# Patient Record
Sex: Male | Born: 1935
Health system: Southern US, Community
[De-identification: ages and names within clinical notes are randomized; demographics above are authoritative.]

## PROBLEM LIST (undated history)

## (undated) DIAGNOSIS — Z8781 Personal history of (healed) traumatic fracture: Secondary | ICD-10-CM

## (undated) DIAGNOSIS — I10 Essential (primary) hypertension: Secondary | ICD-10-CM

## (undated) DIAGNOSIS — I4891 Unspecified atrial fibrillation: Secondary | ICD-10-CM

## (undated) DIAGNOSIS — I499 Cardiac arrhythmia, unspecified: Secondary | ICD-10-CM

## (undated) DIAGNOSIS — K219 Gastro-esophageal reflux disease without esophagitis: Secondary | ICD-10-CM

## (undated) DIAGNOSIS — K2210 Ulcer of esophagus without bleeding: Secondary | ICD-10-CM

## (undated) DIAGNOSIS — M069 Rheumatoid arthritis, unspecified: Secondary | ICD-10-CM

## (undated) HISTORY — PX: CATARACT EXTRACTION: SUR2

---

## 2000-09-06 ENCOUNTER — Ambulatory Visit (HOSPITAL_COMMUNITY): Admission: RE | Admit: 2000-09-06 | Discharge: 2000-09-06 | Payer: Self-pay | Admitting: *Deleted

## 2000-09-06 ENCOUNTER — Encounter (INDEPENDENT_AMBULATORY_CARE_PROVIDER_SITE_OTHER): Payer: Self-pay | Admitting: Specialist

## 2002-08-05 ENCOUNTER — Emergency Department (HOSPITAL_COMMUNITY): Admission: EM | Admit: 2002-08-05 | Discharge: 2002-08-05 | Payer: Self-pay | Admitting: Emergency Medicine

## 2002-08-05 ENCOUNTER — Encounter: Payer: Self-pay | Admitting: Emergency Medicine

## 2003-12-06 ENCOUNTER — Ambulatory Visit (HOSPITAL_COMMUNITY): Admission: RE | Admit: 2003-12-06 | Discharge: 2003-12-06 | Payer: Self-pay | Admitting: *Deleted

## 2005-06-25 ENCOUNTER — Ambulatory Visit (HOSPITAL_COMMUNITY): Admission: RE | Admit: 2005-06-25 | Discharge: 2005-06-25 | Payer: Self-pay | Admitting: Cardiology

## 2005-06-25 IMAGING — CR DG CHEST 2V
3 series · 3 of 3 positions shown · non-contrast
Comparison: There are no prior studies available for comparison t this time.

CLINICAL DATA: Irregular heartbeat.  Pre cardioversion.
 CHEST ? 2 VIEW:

[view not recorded (1 of 3)]
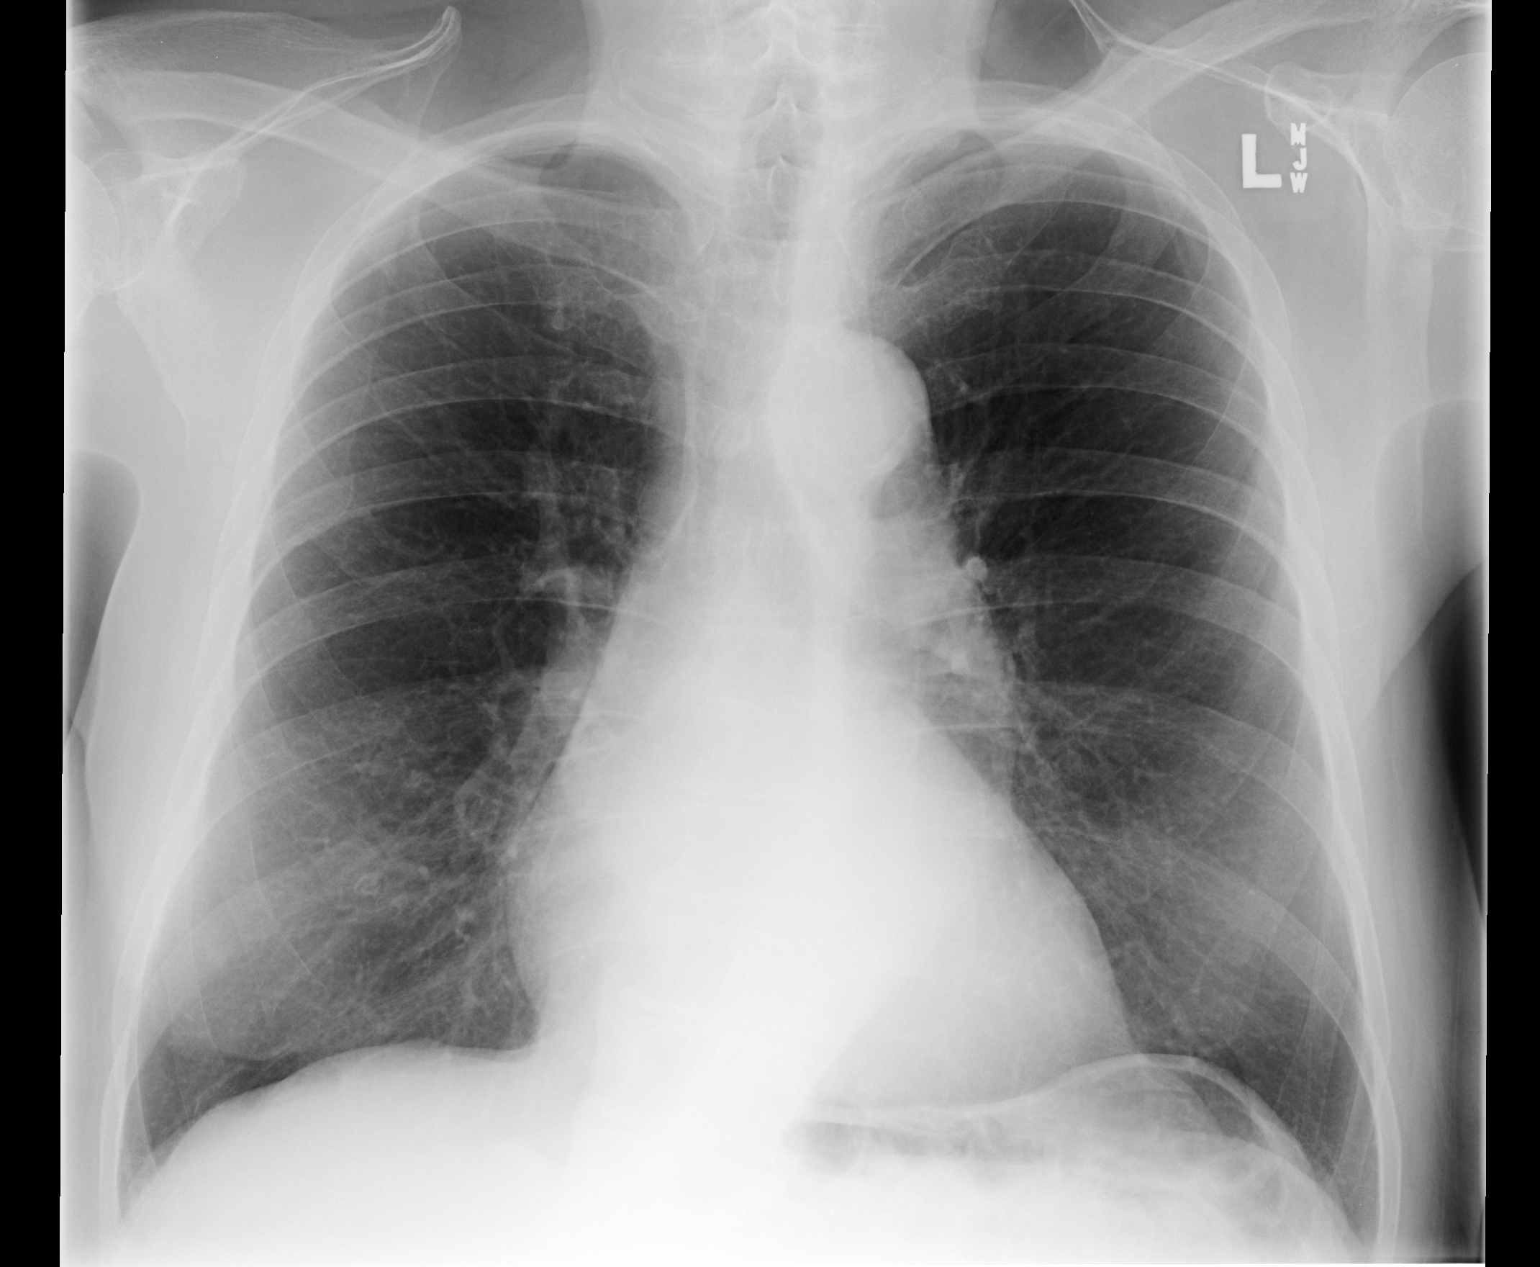

[view not recorded (2 of 3)]
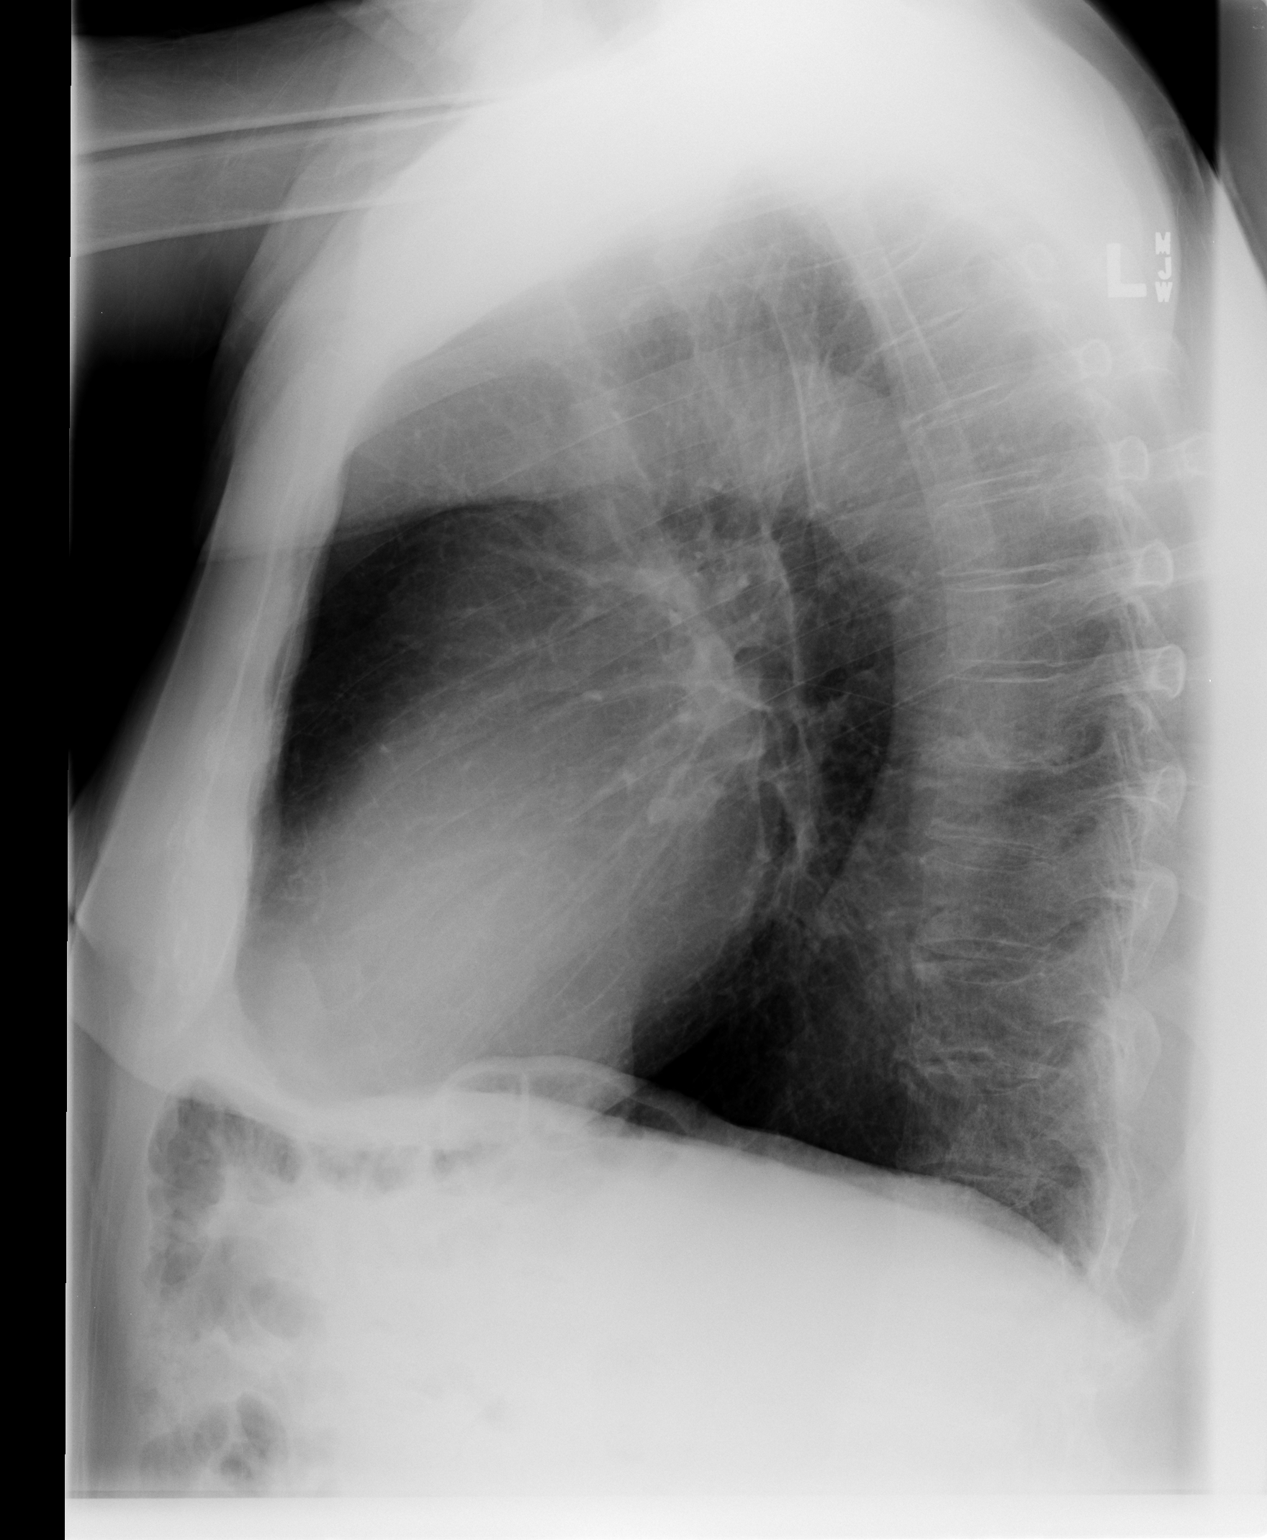

[view not recorded (3 of 3)]
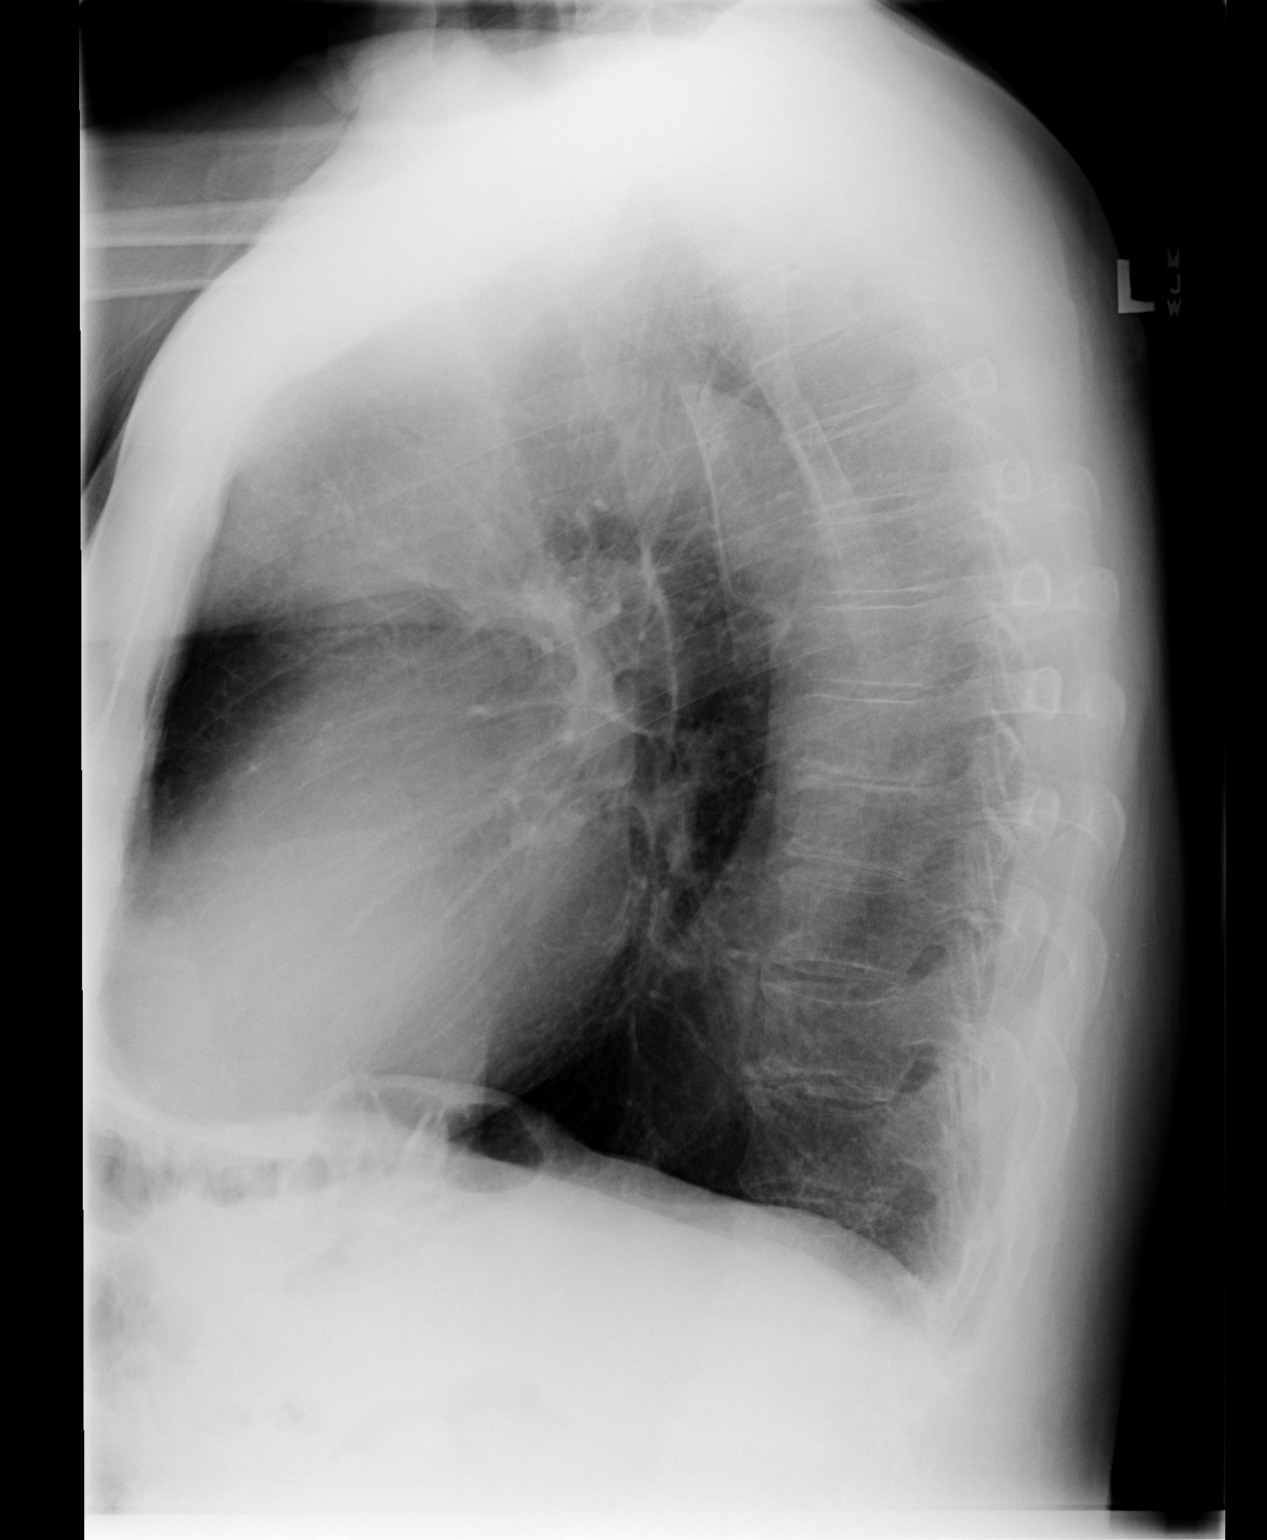

[3 of 3 positions shown; findings below may reference images not displayed]

There is cardiomegaly.   There are no infiltrative or edematous changes.   The aorta is elongated and ectatic.   There are mild changes of COPD.
IMPRESSION: Cardiomegaly.   Ectatic thoracic aorta.   Mild changes of COPD.   No evidence for active chest disease.

## 2008-02-08 ENCOUNTER — Emergency Department (HOSPITAL_COMMUNITY): Admission: EM | Admit: 2008-02-08 | Discharge: 2008-02-08 | Payer: Self-pay | Admitting: Emergency Medicine

## 2008-02-08 IMAGING — CR DG ABDOMEN ACUTE W/ 1V CHEST
3 series · 3 of 3 positions shown · non-contrast
Comparison: [DATE] [DATE]

CLINICAL DATA: Abdominal pain

ACUTE ABDOMEN SERIES (ABDOMEN 2 VIEW & CHEST 1 VIEW)

[w chest pa]
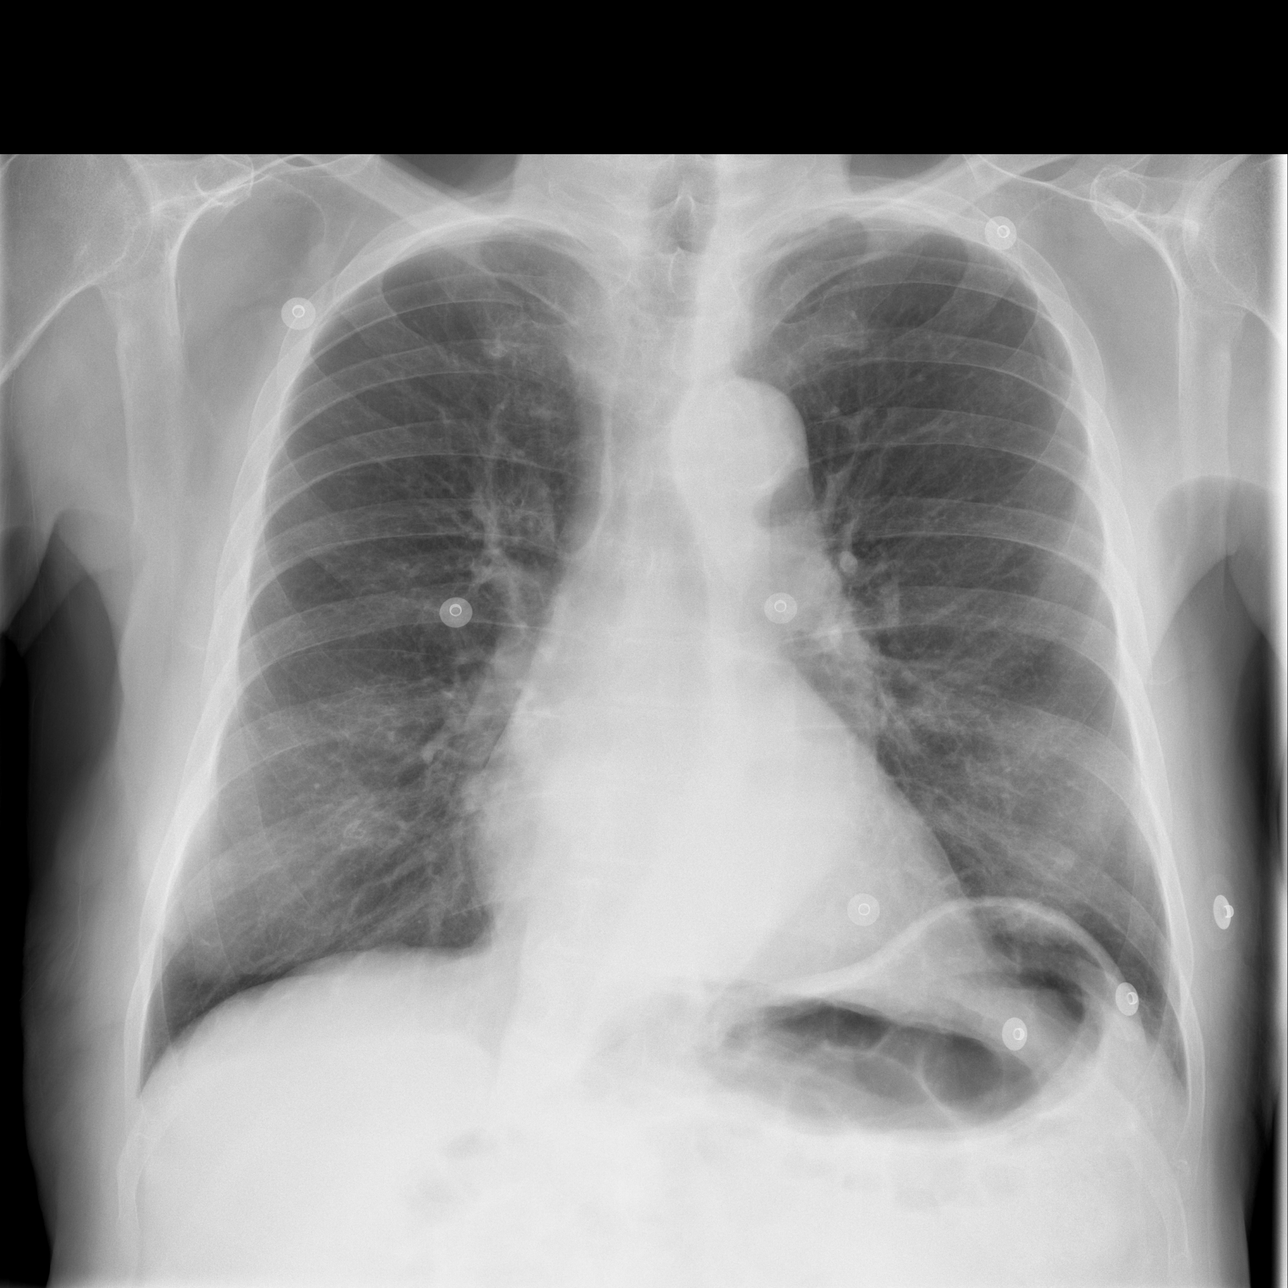

[w abdomen upright]
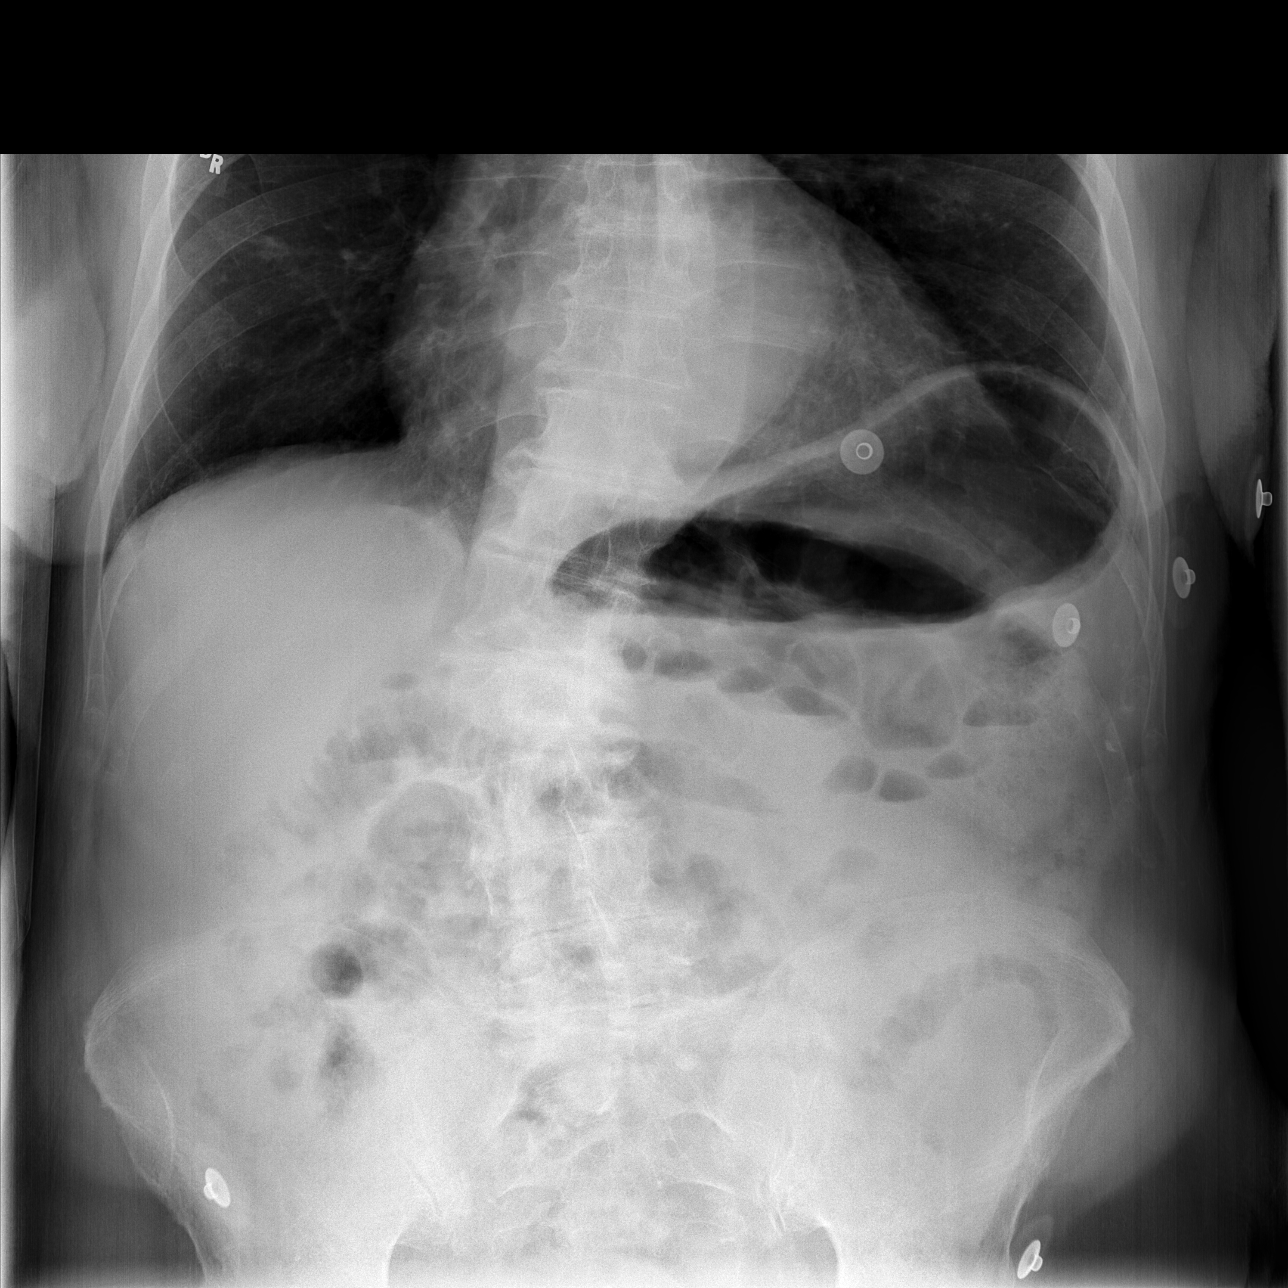

[t abdomen supine]
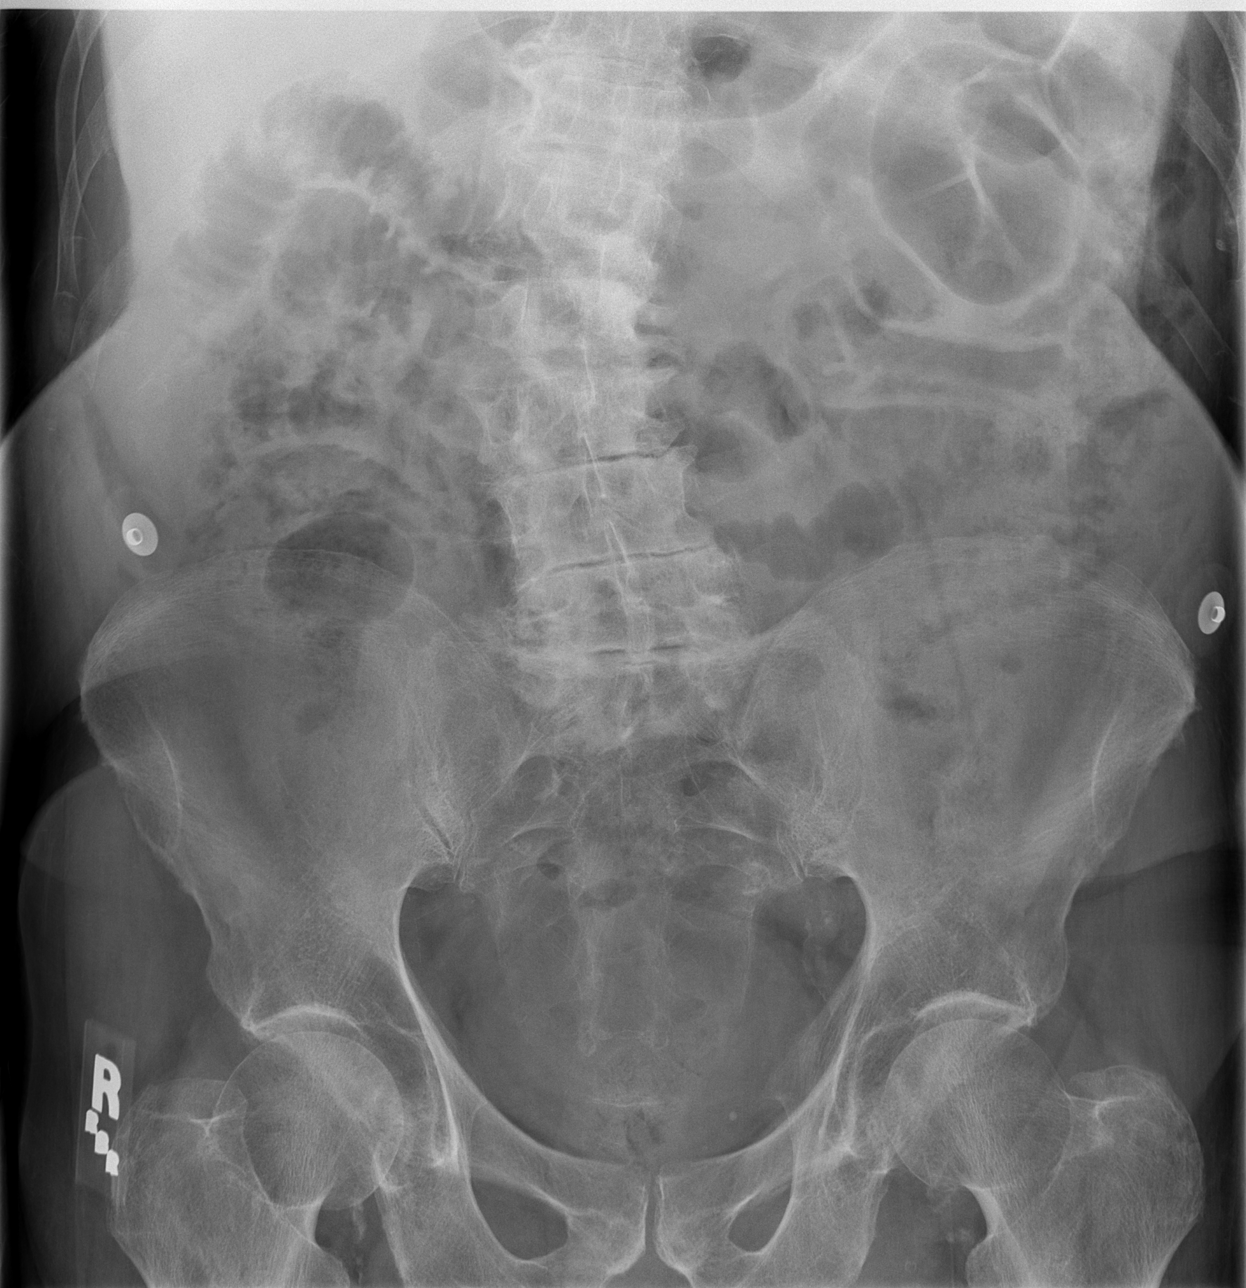

[3 of 3 positions shown; findings below may reference images not displayed]

FINDINGS: Bibasilar nodular densities are stable compatible with
nipple shadows.  The heart is borderline enlarged.

There is no free intraperitoneal gas.  Gas filled loops of small
and large bowel are present without disproportionate dilatation.
Scattered air-fluid levels are present.  No pneumatosis.  Marked
degenerative change in the lumbar spine is present.  Phleboliths
and vascular calcifications are present.
IMPRESSION: Mild ileus pattern.

## 2011-03-30 NOTE — Procedures (Signed)
[facility]  Patient:    [name], [name]                        MRN: [id number] Proc. Date: 09/06/00 Adm. Date:  [id number] Attending:  Sabino Gasser                           Procedure Report  PROCEDURE:  Upper endoscopy.  ENDOSCOPIST:  Sabino Gasser, M.D.  ANESTHESIA:  Demerol 60 mg, Versed 6 mg.  DESCRIPTION OF PROCEDURE:  With the patient mildly sedated in the left lateral decubitus position, the Olympus videoscopic endoscope was inserted in the mouth and passed under direct vision through the esophagus which appeared generally normal although there were some changes of possible esophagitis. They were difficult to photograph but we did reach the distal esophagus for biopsies.  We entered into the stomach and interestingly there were flecks of blood scattered throughout the stomach.  Fundus, body and antrum all with flecks of blood seen.  A CLO biopsy was taken.  We entered into the duodenal bulb and on the anterior wall just distal to the pylorus was a plaque-like lesion which he photographed and biopsied.  We advanced into the second portion of the duodenum which appeared normal and was photographed.  From this point, the endoscope was slowly withdrawn taking circumferential views of the entire duodenal mucosa.  The endoscope was then pulled back into the stomach and placed in retroflexion to view the stomach from below, and a hiatal hernia was seen as evidenced by incomplete wrap of the GE junction around the endoscope.  The endoscope was straightened and withdrawn taking circumferential views of the remaining gastric and esophageal mucosa.  The patients vital signs and pulse oximetry remained stable.  The patient tolerated the procedure well without apparent complications.  FINDINGS:  Plaque-like lesion of the duodenum.   Await biopsy report.  CLO biopsy taken of the antrum.  Esophagitis biopsy and a hiatal hernia was noted.  PLAN:  The patient will  call me for results of biopsy and follow up with me as an outpatient. DD:  09/06/00 TD:  09/06/00 Job: [id number]

## 2011-03-30 NOTE — Op Note (Signed)
[name], [name]                           ACCOUNT NO.:  [id number]   MEDICAL RECORD NO.:  [id number]                   PATIENT TYPE:  AMB   LOCATION:  ENDO                                 FACILITY:  [facility]   PHYSICIAN:  Georgiana Spinner, M.D.                 DATE OF BIRTH:  09/06/1923   DATE OF PROCEDURE:  DATE OF DISCHARGE:                                 OPERATIVE REPORT   PROCEDURE:  Colonoscopy.   INDICATION:  Colon polyp.   ANESTHESIA:  Demerol 80 mg, Versed 8 mg.   DESCRIPTION OF PROCEDURE:  With the patient mildly sedated in the left  lateral decubitus position, the Olympus videoscopic colonoscope was inserted  in the rectum and passed under direct vision to the cecum identified by the  ileocecal valve and appendiceal orifice both of which were photographed.  From this point, the colonoscope was slowly withdrawn, taking  circumferential views of the colonic mucosa and stopping in the rectum which  appeared normal on direct and retroflex view.  The endoscope was  straightened and withdrawn.  The patient's vital signs and pulse oximetry  remained stable, the patient tolerated the procedure well without apparent  complications.   FINDINGS:  Diverticulosis of the sigmoid colon, otherwise an unremarkable  examination.   PLAN:  Repeat examination in 5 years.                                               Georgiana Spinner, M.D.    GMO/MEDQ  D:  12/06/2003  T:  12/06/2003  Job:  [id number]

## 2011-03-30 NOTE — Procedures (Signed)
[facility]  Patient:    [name], [name]                        MRN: [id number] Proc. Date: 09/06/00 Adm. Date:  [id number] Attending:  Sabino Gasser                           Procedure Report  PROCEDURE:  Colonoscopy.  SURGEON:  Sabino Gasser, M.D.  INDICATIONS:  Hemoccult positivity.  ANESTHESIA:  Demerol 20 mg and Versed 2 mg.  DESCRIPTION OF PROCEDURE:  With the patient mildly sedated in the left lateral decubitus position, the Olympus videoscopic colonoscope was inserted in the rectum, and passed under direct vision to the cecum.  The cecum was identified by the ileocecal valve and appendiceal orifice, both of which were photographed.  There were three small polyps seen in the cecum and they were removed using hot biopsy forceps.  ________ of 20/20 blunted current.  We then entered into the terminal ileum which appeared normal and was photographed.  The endoscope was then withdrawn, taking circumferential views of the terminal ileum and the colon, stopping to photograph the cecum, and a rare diverticulum seen ________ in the sigmoid colon.  We stopped eventually in the rectum which appeared normal on direct and retroflexed view.  The endoscope was straightened and withdrawn.  The patients vital signs and pulse oximeter remained stable.  The patient tolerated the procedure well without apparent complications.  FINDINGS:  Rare diverticulum of sigmoid, three small sessile polyps of the cecum, await biopsy report.  The patient will call me for results and follow up with me as an outpatient. DD:  09/06/00 TD:  09/06/00 Job: [id number]

## 2011-05-03 ENCOUNTER — Inpatient Hospital Stay (HOSPITAL_COMMUNITY)
Admission: EM | Admit: 2011-05-03 | Discharge: 2011-05-06 | DRG: 310 | Disposition: A | Discharge status: Home / Self Care | Payer: Medicare Other | Attending: Cardiology | Admitting: Cardiology

## 2011-05-03 ENCOUNTER — Emergency Department (HOSPITAL_COMMUNITY): Payer: Medicare Other

## 2011-05-03 DIAGNOSIS — I498 Other specified cardiac arrhythmias: Principal | ICD-10-CM | POA: Diagnosis present

## 2011-05-03 DIAGNOSIS — R791 Abnormal coagulation profile: Secondary | ICD-10-CM | POA: Diagnosis present

## 2011-05-03 DIAGNOSIS — I10 Essential (primary) hypertension: Secondary | ICD-10-CM | POA: Diagnosis present

## 2011-05-03 DIAGNOSIS — Z79899 Other long term (current) drug therapy: Secondary | ICD-10-CM

## 2011-05-03 DIAGNOSIS — Z7901 Long term (current) use of anticoagulants: Secondary | ICD-10-CM

## 2011-05-03 DIAGNOSIS — I4891 Unspecified atrial fibrillation: Secondary | ICD-10-CM | POA: Diagnosis present

## 2011-05-03 DIAGNOSIS — R0789 Other chest pain: Secondary | ICD-10-CM | POA: Diagnosis present

## 2011-05-03 DIAGNOSIS — M129 Arthropathy, unspecified: Secondary | ICD-10-CM | POA: Diagnosis present

## 2011-05-03 DIAGNOSIS — I4892 Unspecified atrial flutter: Secondary | ICD-10-CM | POA: Diagnosis present

## 2011-05-03 LAB — CARDIAC PANEL(CRET KIN+CKTOT+MB+TROPI): Relative Index: INVALID (ref 0.0–2.5)

## 2011-05-03 LAB — DIFFERENTIAL
Basophils Absolute: 0 10*3/uL (ref 0.0–0.1)
Basophils Relative: 0 % (ref 0–1)
Eosinophils Absolute: 0 10*3/uL (ref 0.0–0.7)
Eosinophils Relative: 0 % (ref 0–5)
Lymphocytes Relative: 5 % — ABNORMAL LOW (ref 12–46)
Lymphs Abs: 0.6 10*3/uL — ABNORMAL LOW (ref 0.7–4.0)
Monocytes Absolute: 0.8 10*3/uL (ref 0.1–1.0)
Neutro Abs: 10.4 10*3/uL — ABNORMAL HIGH (ref 1.7–7.7)
Neutrophils Relative %: 88 % — ABNORMAL HIGH (ref 43–77)

## 2011-05-03 LAB — BASIC METABOLIC PANEL
BUN: 14 mg/dL (ref 6–23)
CO2: 25 mEq/L (ref 19–32)
Chloride: 96 mEq/L (ref 96–112)
Creatinine, Ser: 1.08 mg/dL (ref 0.50–1.35)
GFR calc Af Amer: >60 mL/min (ref >60)

## 2011-05-03 LAB — COMPREHENSIVE METABOLIC PANEL
BUN: 14 mg/dL (ref 6–23)
CO2: 25 mEq/L (ref 19–32)
Calcium: 9.4 mg/dL (ref 8.4–10.5)
Creatinine, Ser: 0.92 mg/dL (ref 0.50–1.35)
GFR calc Af Amer: >60 mL/min (ref >60)
GFR calc non Af Amer: >60 mL/min (ref >60)
Glucose, Bld: 141 mg/dL — ABNORMAL HIGH (ref 70–99)

## 2011-05-03 LAB — TSH: TSH: 0.952 u[IU]/mL (ref 0.350–4.500)

## 2011-05-03 LAB — PROTIME-INR
INR: 1.69 — ABNORMAL HIGH (ref 0.00–1.49)
Prothrombin Time: 20.2 seconds — ABNORMAL HIGH (ref 11.6–15.2)

## 2011-05-03 LAB — CK TOTAL AND CKMB (NOT AT ARMC)
Relative Index: INVALID (ref 0.0–2.5)
Total CK: 31 U/L (ref 7–232)

## 2011-05-03 LAB — CBC
Hemoglobin: 13.1 g/dL (ref 13.0–17.0)
MCHC: 35.7 g/dL (ref 30.0–36.0)

## 2011-05-03 IMAGING — CR DG CHEST 1V PORT
1 series · 1 of 1 positions shown · non-contrast
Comparison: [DATE]

CLINICAL DATA: Chest pains and shortness of breath; atrial
fibrillation

PORTABLE CHEST - 1 VIEW

[view not recorded]
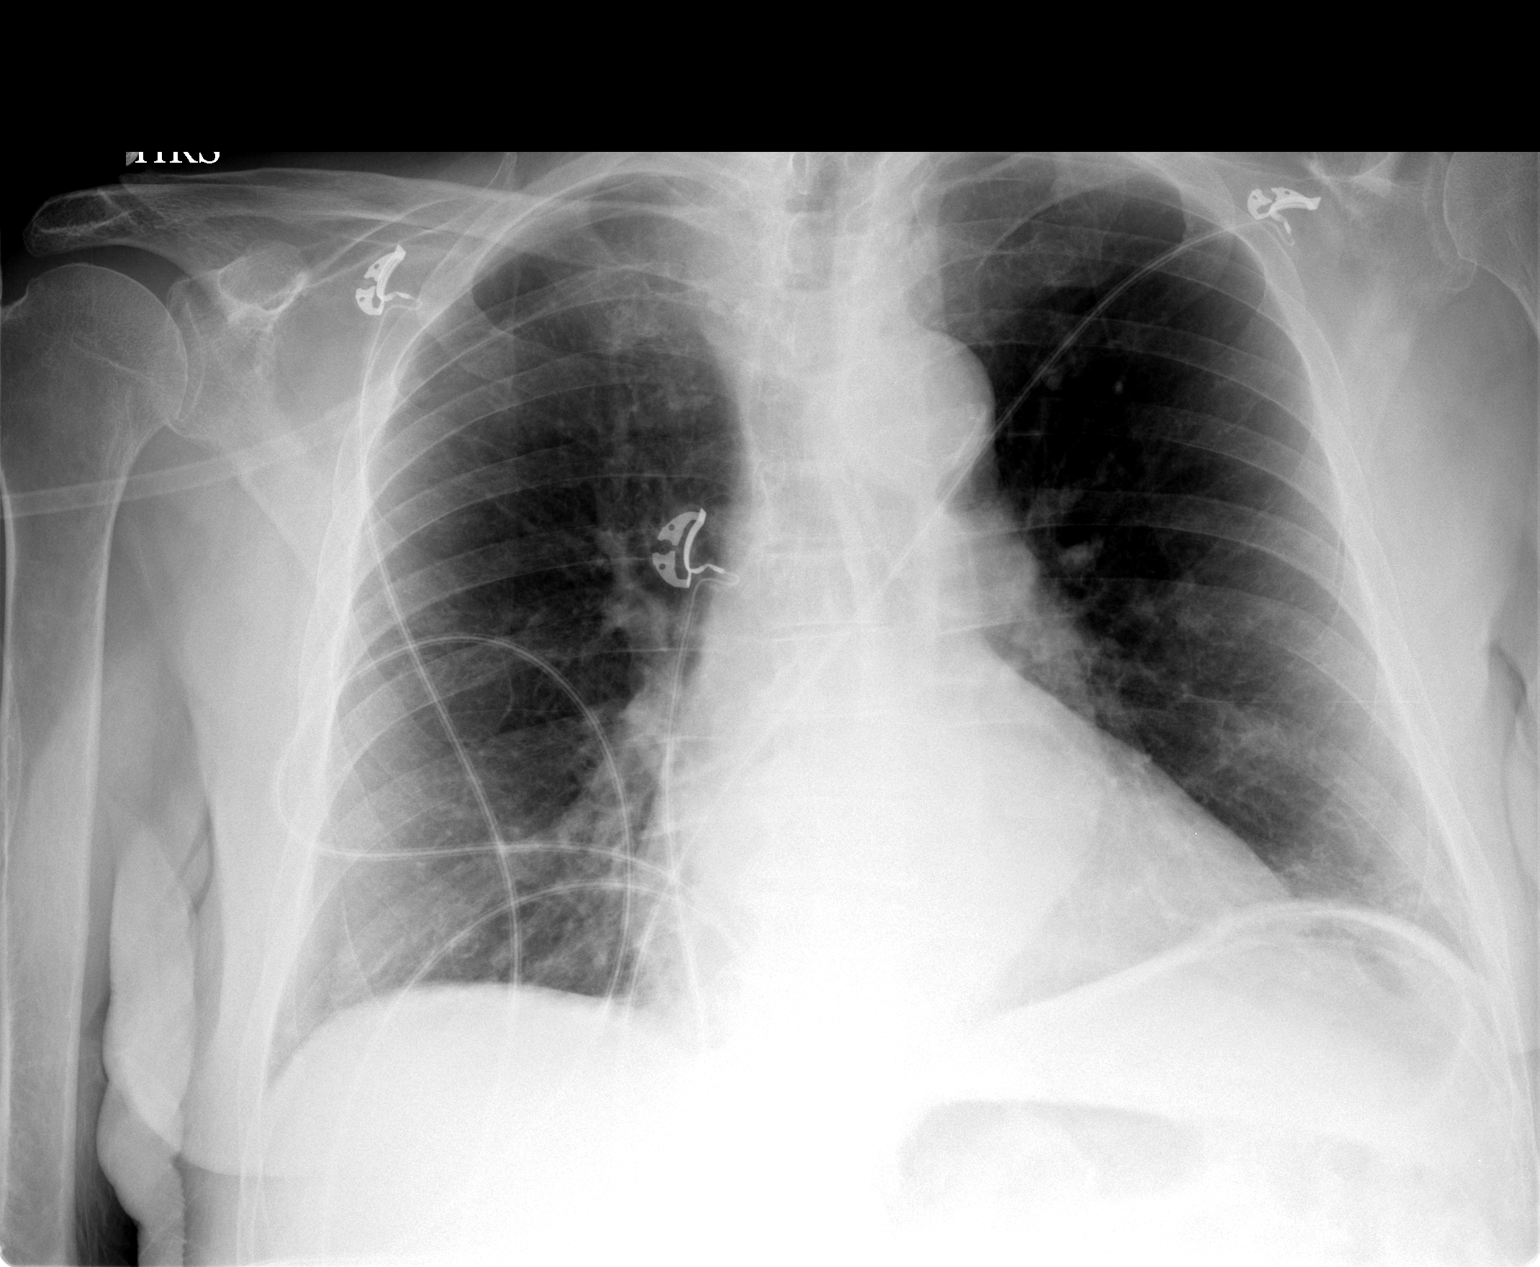

[1 of 1 positions shown; findings below may reference images not displayed]

FINDINGS: Mild cardiomegaly and thoracic aortic ectasia are
unchanged.  The mediastinum and pulmonary vasculature are within
normal limits.  Both lungs are clear.
IMPRESSION: There is no evidence of acute cardiac or pulmonary process.

## 2011-05-04 ENCOUNTER — Inpatient Hospital Stay (HOSPITAL_COMMUNITY): Payer: Medicare Other

## 2011-05-04 LAB — CARDIAC PANEL(CRET KIN+CKTOT+MB+TROPI)
CK, MB: 1.8 ng/mL (ref 0.3–4.0)
Total CK: 33 U/L (ref 7–232)

## 2011-05-04 LAB — CBC
Hemoglobin: 10.6 g/dL — ABNORMAL LOW (ref 13.0–17.0)
MCHC: 35.5 g/dL (ref 30.0–36.0)
Platelets: 273 10*3/uL (ref 150–400)
RBC: 3.35 MIL/uL — ABNORMAL LOW (ref 4.22–5.81)

## 2011-05-04 LAB — PROTIME-INR
INR: 2.32 — ABNORMAL HIGH (ref 0.00–1.49)
Prothrombin Time: 25.9 seconds — ABNORMAL HIGH (ref 11.6–15.2)

## 2011-05-04 LAB — HEPARIN LEVEL (UNFRACTIONATED): Heparin Unfractionated: 0.43 IU/mL (ref 0.30–0.70)

## 2011-05-04 IMAGING — CT CT ANGIO CHEST
2 of 7 series · 18 of 36 positions shown · IV contrast (agent unspecified)
Comparison: Chest x-ray dated [DATE]

CLINICAL DATA: Chest pain with elevated D-dimer.  Shortness of
breath.

CT ANGIOGRAPHY CHEST WITH CONTRAST
TECHNIQUE: Multidetector CT imaging of the chest was performed
using the standard protocol during bolus administration of
intravenous contrast.  Multiplanar CT image reconstructions
including MIPs were obtained to evaluate the vascular anatomy.
Contrast:  90 ml [KB]

[Series 5: pe thins · axial · 0.84mm/px · z∈[-289,-17]mm · 17 of 307 slices shown]
[im 18/307  lung]
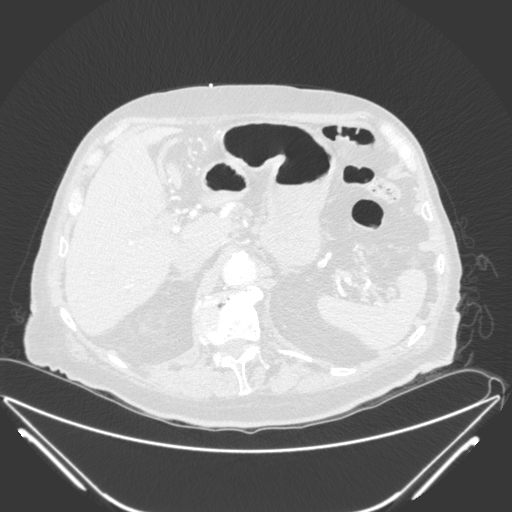
[im 35/307  mediastinal]
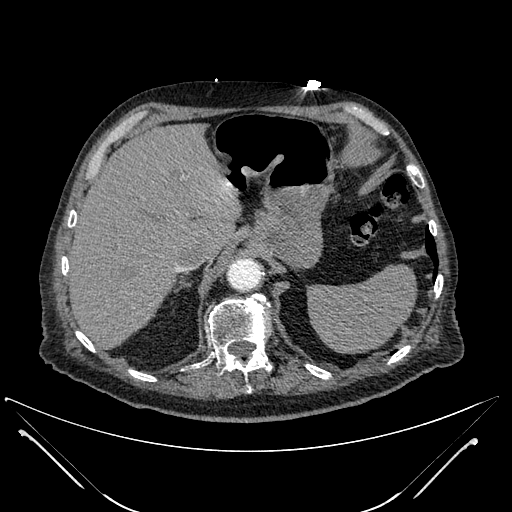
[im 52/307  lung]
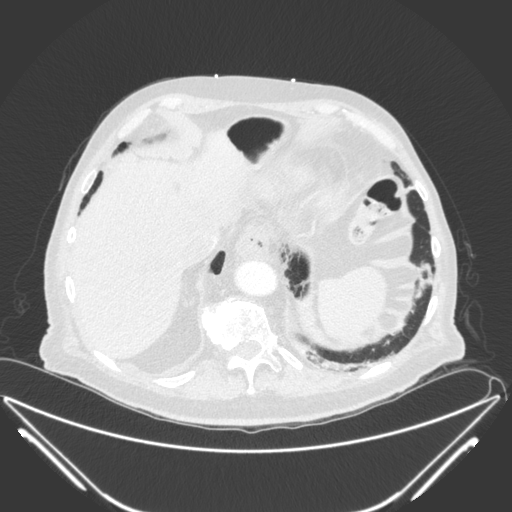
[im 69/307  mediastinal]
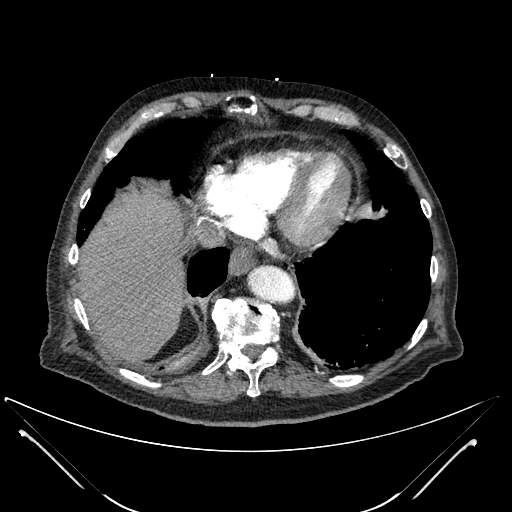
[im 86/307  lung]
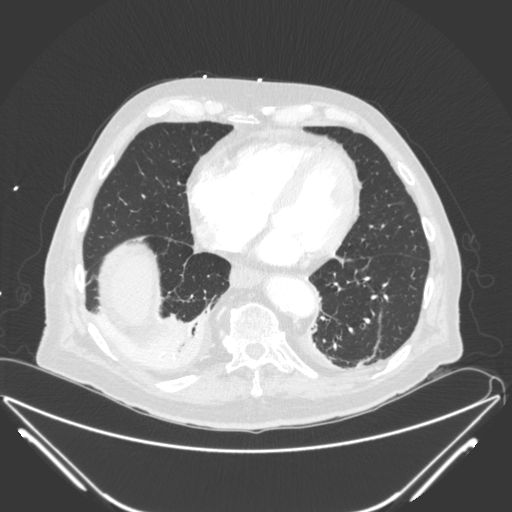
[im 103/307  mediastinal]
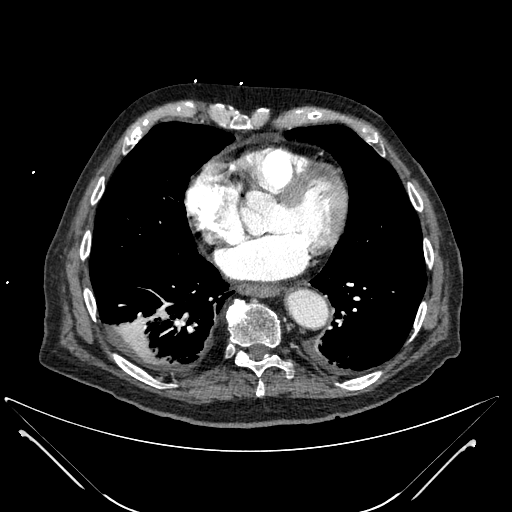
[im 120/307  lung]
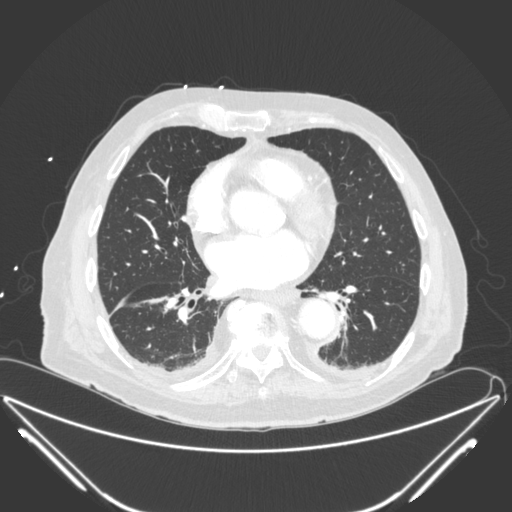
[im 137/307  mediastinal]
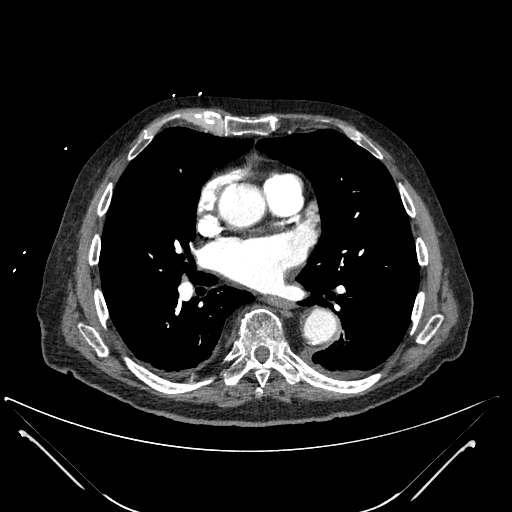
[im 154/307  lung]
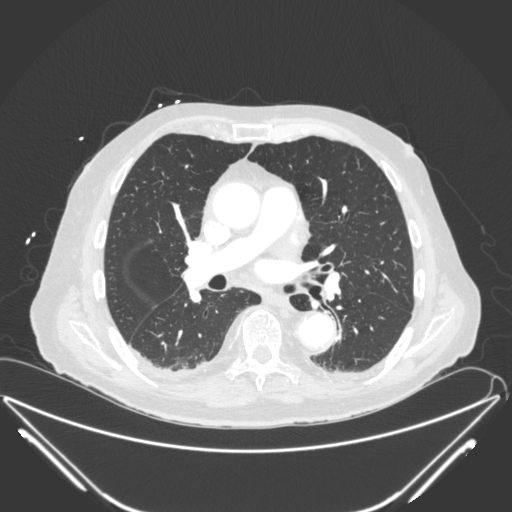
[im 171/307  mediastinal]
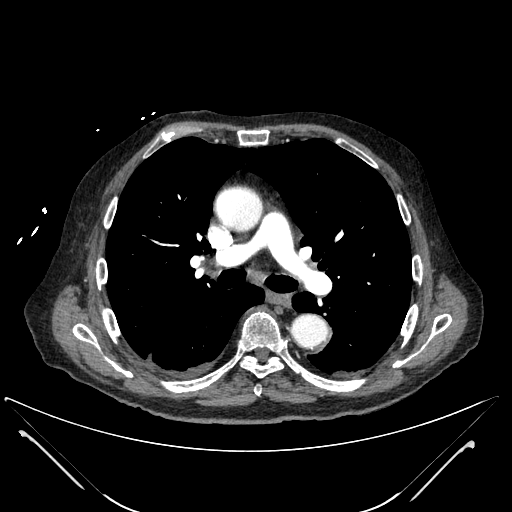
[im 188/307  lung]
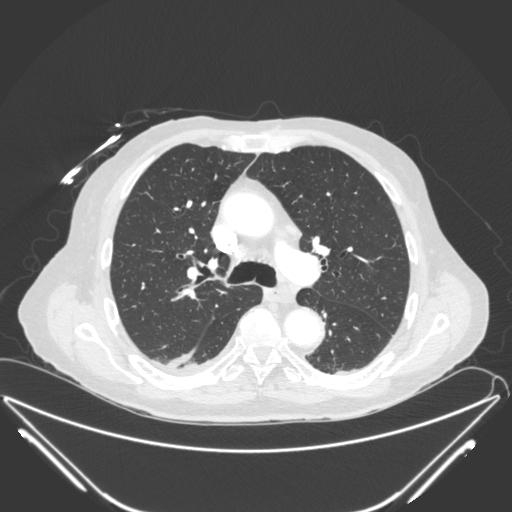
[im 205/307  mediastinal]
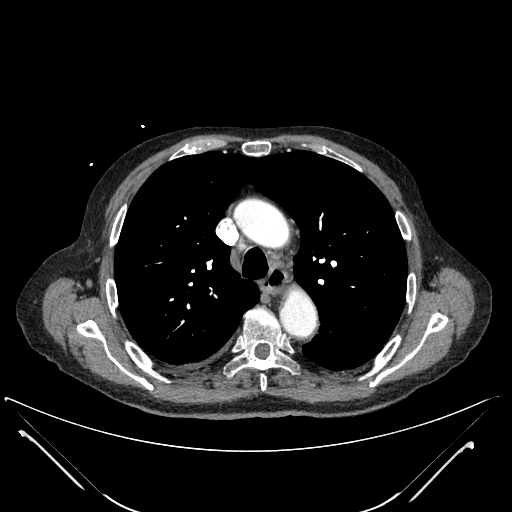
[im 222/307  lung]
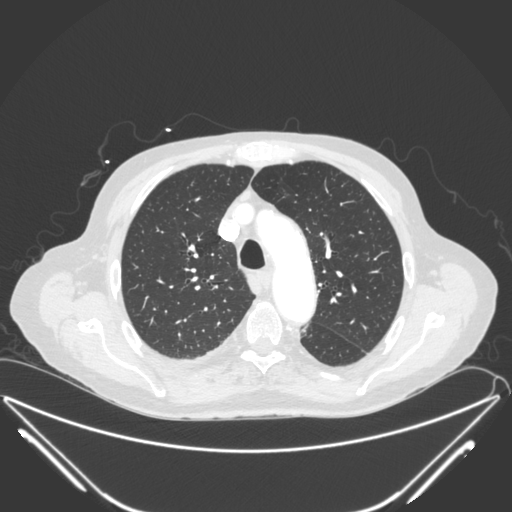
[im 239/307  mediastinal]
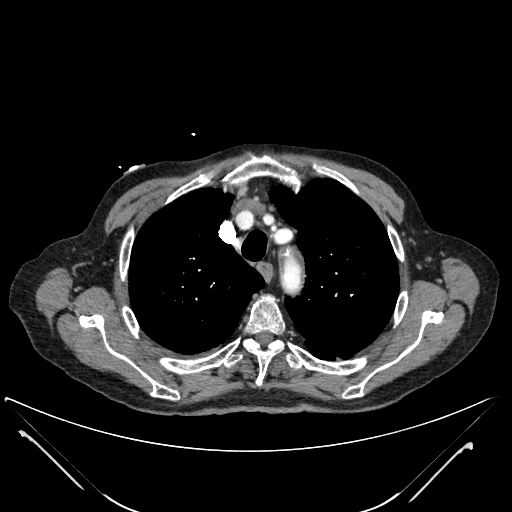
[im 256/307  lung]
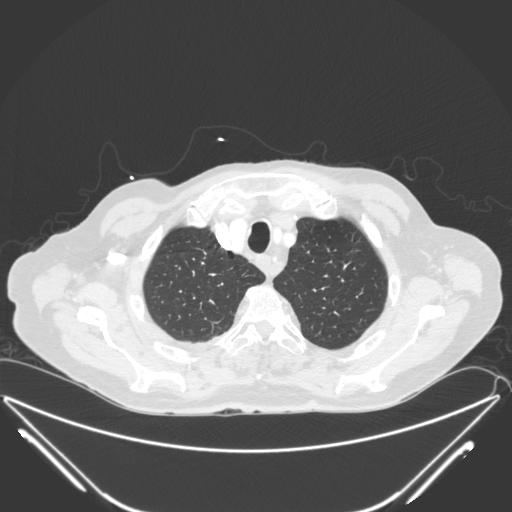
[im 273/307  mediastinal]
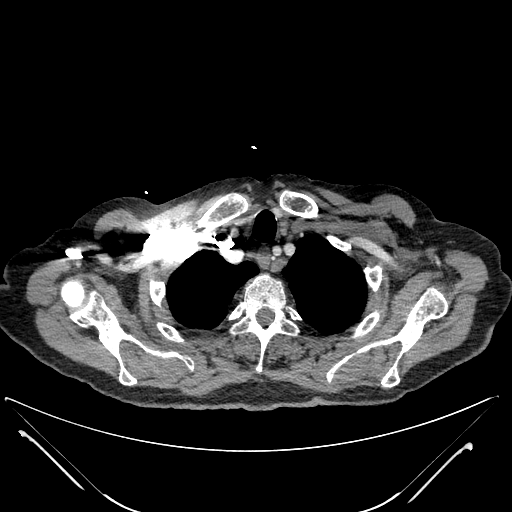
[im 290/307  lung]
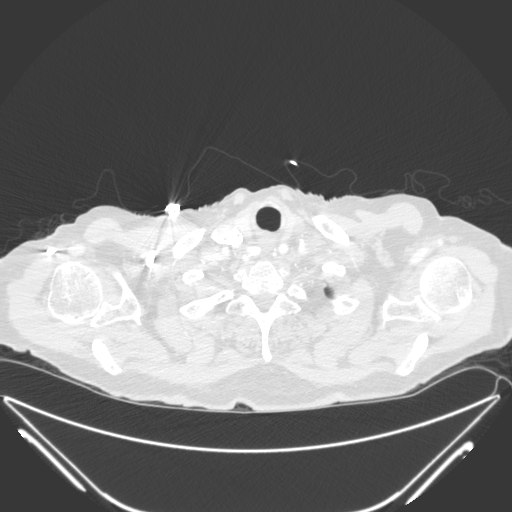

[mpr, coronals, coronal · coronal · 0.84mm/px · 1 of 192 slices shown]
[im 96/192  mediastinal]
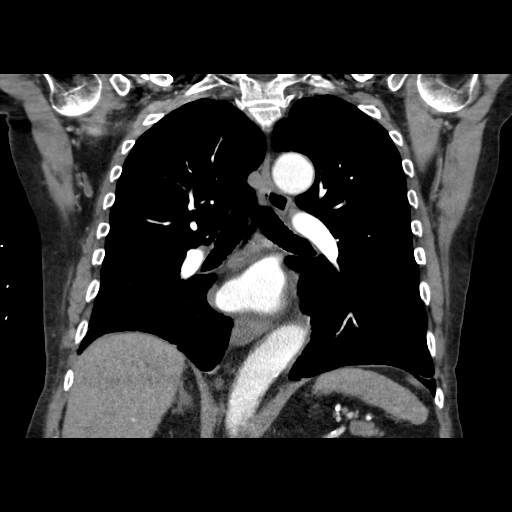

[18 of 36 positions shown; findings below may reference images not displayed]

FINDINGS: There are no pulmonary emboli.  There is a focal area of
atelectasis at the right lung base with a tiny right effusion.
There is also a tiny left effusion.

There are scattered small lymph nodes in the mediastinum, none of
which appear worrisome.

There is moderate coronary artery calcification.  Overall heart
size is normal.

No acute osseous abnormalities.

Review of the MIP images confirms the above findings.
IMPRESSION: 1.  No pulmonary emboli.
2.  Focal mild atelectasis at the right lung base posteriorly.
3.  Tiny bilateral pleural effusions.

## 2011-05-04 MED ORDER — IOHEXOL 300 MG/ML  SOLN
100.0000 mL | Freq: Once | INTRAMUSCULAR | Status: AC | PRN
  Administered 2011-05-04: 90 mL via INTRAVENOUS

## 2011-05-05 LAB — PROTIME-INR: INR: 1.57 — ABNORMAL HIGH (ref 0.00–1.49)

## 2011-05-06 LAB — PROTIME-INR: Prothrombin Time: 24.4 seconds — ABNORMAL HIGH (ref 11.6–15.2)

## 2011-05-06 LAB — CBC
Hemoglobin: 10 g/dL — ABNORMAL LOW (ref 13.0–17.0)
MCH: 31.4 pg (ref 26.0–34.0)
RBC: 3.18 MIL/uL — ABNORMAL LOW (ref 4.22–5.81)
WBC: 6.1 10*3/uL (ref 4.0–10.5)

## 2011-05-06 LAB — HEPARIN LEVEL (UNFRACTIONATED): Heparin Unfractionated: 0.35 IU/mL (ref 0.30–0.70)

## 2011-05-15 NOTE — H&P (Signed)
NAMEJAYDON, [name] NO.:  [id number]  MEDICAL RECORD NO.:  [id number]  LOCATION:  [facility]                         FACILITY:  [facility]  PHYSICIAN:  [name], M.D.DATE OF BIRTH:  [birth year]  DATE OF ADMISSION:  05/03/2011                             HISTORY & PHYSICAL   REASON FOR ADMISSION:  Chest pain and rapid atrial fibrillation.  HISTORY:  The patient is an 77 year old male who was brought in to rule out a myocardial infarction.  The patient has a history of atrial fibrillation and previously was taken care of by Dr. Aleen Rios.  He reported the onset of atrial fibrillation, underwent cardioversion with restoration to sinus rhythm and then went back into atrial fibrillation. It is unclear from the history whether he has had chronic atrial fibrillation or paroxysmal since then.  During an episode of emotional distress in May, he was noted to be in rapid atrial fibrillation in his primary physician's office.  He was due to see me as a new patient next week but this morning had the onset of atypical pain across his upper shoulders and mid back, described as sharp and associated with mild shortness of breath.  He came to the [facility] Emergency Room where he was found to be in rapid atrial fibrillation and treated with intravenous diltiazem.  He still complains of some mild residual discomfort across his upper back.  He normally does not have angina and can work in his yard and do normal activities.  He has no PND, orthopnea, or claudication and complains of a mild amount of edema.  PAST MEDICAL HISTORY:  He has a history of hypertension previously. There is no history of diabetes.  There is no history of stroke or TIA. He has no history of gastrointestinal bleeding.  He has a history of significant arthritis, treated with hydroxychloroquine by Dr. Dierdre Rios, the type of arthritis is unknown at this time.  He was previously treated with Voltaren.  PAST SURGICAL  HISTORY:  Hemorrhoidectomy.  ALLERGIES:  SULFA and SHELLFISH.  CURRENT MEDICATIONS: 1. Diltiazem sustained release 360 mg daily. 2. Warfarin 2 mg daily except 4 mg 1 day a week. 3. Metoprolol 25 b.i.d. 4. Hydroxychloroquine 200 mg daily. 5. Fish oil 1000 mg daily.  SOCIAL HISTORY:  He is retired from [employer], formally lived in [place] and moved here in 2001.  His son lives here in town.  He smokes cigars occasionally but has never smoked cigarettes.  He used to have a drink a day but stopped this when he went on warfarin.  He has been married for a number of years.  FAMILY HISTORY:  Father died at age 75.  He had some sort of throat problem but the cause of death is unknown.  Mother died at age 65 of a stroke.  One brother died at age 62 of old age, another brother died at age 48 of cancer, and another brother died at age 8 suddenly.  One sister died of COPD at age 63, another died at age 1 of depression following the death of her husband.  He has one sister still living.  He has one son and one daughter alive and  well.  No family history of premature cardiac disease.  REVIEW OF SYSTEMS:  His weight has been stable.  He has no skin problems.  No eye, ear, nose, or throat problems other than wearing glasses.  He complains of dysphagia and has some vomiting oftentimes after he eats and for this reason does not go out to eat much.  He has no history of GI bleeding, no diarrhea or constipation.  Complains of very mild nocturia, otherwise, no GU symptoms.  Complains of arthritis that shifts around.  Normally, he has no shortness of breath, wheezing, or hemoptysis.  He does not have any history of weight loss.  No history of stroke or TIA.  Other than as noted above, the remainder of the review of systems is unremarkable.  PHYSICAL EXAMINATION:  GENERAL:  He is a pleasant elderly male, appearing younger than stated age. VITAL SIGNS:  Blood pressure was 135/80, pulse is currently 70  and irregular. SKIN:  Warm and dry. ENT:  EOMI.  PERRLA.  C and S clear.  Wears glasses.  Pharynx negative. NECK:  Supple without masses, JVD, thyromegaly, or bruits. LUNGS:  Clear anteriorly, very faint crackles posteriorly.  There is a faint 1-2/6 systolic murmur at the aortic area.  S2 was preserved. ABDOMEN:  Soft and nontender.  No hepatosplenomegaly or masses. EXTREMITIES:  Femoral pulses are 2+, posterior tibial pulses 2+, dorsalis pedis 1+.  Changes of venous insufficiency noted bilaterally. Mild trace edema noted. NEUROLOGIC:  Normal cranial nerves, sensory and motor intact.  EKG shows atrial fibrillation with rapid response initially. Laboratory data shows a white count 11,900.  Pro time INR is subtherapeutic at 1.69.  Glucose is 133, BUN 14, creatinine is 1.08. Chest x-ray shows cardiomegaly.  IMPRESSION: 1. Chest pain with atypical features with a nondiagnostic EKG,     possible unstable angina. 2. Atrial fibrillation with uncontrolled ventricular response. 3. Chronic anticoagulation with warfarin subtherapeutic. 4. Generalized arthritis, treated with hydroxychloroquine, of     uncertain type. 5. Changes of mild chronic venous insufficiency. 6. Aortic systolic murmur, faint.  RECOMMENDATIONS:  The patient will be placed on heparin because his warfarin is subtherapeutic to be given aspirin.  Check serial enzymes. Check echocardiogram, TSH, metabolic panel, comprehensive further workup depending on results of the above.     [name], M.D.     WST/MEDQ  D:  05/03/2011  T:  05/03/2011  Job:  [id number]  cc:   [name], M.D. Thomas Deeds, MD  Electronically Signed by [name]. [name] M.D. on 05/15/2011 05:11:52 PM

## 2011-05-15 NOTE — Discharge Summary (Signed)
NAMEMARQUS, [name]               ACCOUNT NO.:  [id number]  MEDICAL RECORD NO.:  [id number]  LOCATION:  4742                         FACILITY:  [facility]  PHYSICIAN:  [name], M.D.DATE OF BIRTH:  11/11/1923  DATE OF ADMISSION:  05/03/2011 DATE OF DISCHARGE:  05/06/2011                              DISCHARGE SUMMARY   FINAL DIAGNOSES: 1. Atrial tachycardia with block with rapid response versus atypical     flutter. 2. Atypical chest pain, resolved. 3. Coronary calcification noted on CT scan. 4. Long-term anticoagulation with warfarin. 5. History of paroxysmal atrial fibrillation. 6. Significant generalized arthritis.  PROCEDURES:  Echocardiogram.  HISTORY:  The patient is an 77 year old male with a prior history of atrial fibrillation.  He has a history of atrial fibrillation and underwent cardioversion in 2006, but went back into atrial fibrillation. It is unclear from his history whether he has had chronic atrial fibrillation or paroxysmal since then.  During an episode of an emotional distress, in May, he was noted to be in rapid atrial fibrillation as his primary physician's office.  He developed atypical pain across his upper shoulders and mid-back described as sharp and associated with mild shortness of breath and came to the emergency room where he was found to be in rapid atrial rhythm, tachycardia versus flutter with block.  He complains of very mild residual discomfort in his upper back and was admitted for further evaluation.  Please see the previously dictated history and physical for remainder of the details.  HOSPITAL COURSE:  The patient was begun on a diltiazem drip on admission.  CT angiogram of the chest done because of an elevated D- dimer shows no focal pulmonary emboli, atelectasis, and tiny bilateral pleural effusions.  EKG showed atrial tachycardia with block. Laboratory data shows white count of 11,900, hemoglobin 13.1, hematocrit 36.7, and INR  was 2.91 on admission.  Chemistry panel was normal.  Three sets of cardiac enzymes were normal.  TSH is 0.952.  The patient was admitted to the hospital and was continued on warfarin.  The pharmacy was dose in the warfarin, but unfortunately his INR drifted down and we had to start heparin to cover this.  He had his metoprolol increased because of increased ventricular response.  Diltiazem drip was stopped. He was placed on amiodarone 200 mg 3 times a day, trying to get it into convert, but he did not convert, continued to feel relatively well at that point.  He clinically improved and by Sunday, we had to restart his heparin, but his INR to come back up to normal at that point in time. He very much wished to go home and his atrial tachycardia with block was improved.  I spoke to the electrophysiologist, he recommended a brief trial of amiodarone to safely due rate control or converting back to sinus rhythm.  An echocardiogram showed preserved left ventricular systolic function.  He is discharged at this time in improved condition on sustained-release diltiazem 360 mg daily, metoprolol 50 mg twice daily, hydroxychloroquine 200 mg daily, fish oil 1000 mg b.i.d., warfarin 2 mg daily except 4 mg on Friday, and amiodarone 200 mg daily. It is recommended that he followup on  Thursday for regular appointment to be seen by the [facility] on Tuesday.  He should do activity as related to his symptoms.     [name], M.D.     WST/MEDQ  D:  05/06/2011  T:  05/06/2011  Job:  [id number]  cc:   [name], M.D.  Electronically Signed by Lacretia Nicks. Donnie Aho M.D. on 05/15/2011 05:12:02 PM

## 2011-08-06 LAB — URINALYSIS, ROUTINE W REFLEX MICROSCOPIC
Hgb urine dipstick: NEGATIVE
Nitrite: NEGATIVE
Specific Gravity, Urine: 1.014
Urobilinogen, UA: 0.2

## 2011-08-06 LAB — COMPREHENSIVE METABOLIC PANEL
AST: 17
Albumin: 3.1 — ABNORMAL LOW
CO2: 25
Calcium: 8.5
Creatinine, Ser: 1.27
GFR calc Af Amer: >60
GFR calc non Af Amer: 54 — ABNORMAL LOW

## 2011-08-06 LAB — DIFFERENTIAL
Eosinophils Relative: 1
Lymphocytes Relative: 6 — ABNORMAL LOW
Lymphs Abs: 0.6 — ABNORMAL LOW

## 2011-08-06 LAB — CBC
MCHC: 34.1
MCV: 93.8
Platelets: 227

## 2011-08-06 LAB — PROTIME-INR: Prothrombin Time: 23.3 — ABNORMAL HIGH

## 2011-11-19 DIAGNOSIS — I4891 Unspecified atrial fibrillation: Secondary | ICD-10-CM | POA: Diagnosis not present

## 2011-11-19 DIAGNOSIS — I10 Essential (primary) hypertension: Secondary | ICD-10-CM | POA: Diagnosis not present

## 2011-11-19 DIAGNOSIS — Z7901 Long term (current) use of anticoagulants: Secondary | ICD-10-CM | POA: Diagnosis not present

## 2011-11-29 DIAGNOSIS — M069 Rheumatoid arthritis, unspecified: Secondary | ICD-10-CM | POA: Diagnosis not present

## 2011-11-29 DIAGNOSIS — Z79899 Other long term (current) drug therapy: Secondary | ICD-10-CM | POA: Diagnosis not present

## 2011-12-03 DIAGNOSIS — I4891 Unspecified atrial fibrillation: Secondary | ICD-10-CM | POA: Diagnosis not present

## 2011-12-03 DIAGNOSIS — Z7901 Long term (current) use of anticoagulants: Secondary | ICD-10-CM | POA: Diagnosis not present

## 2011-12-17 DIAGNOSIS — Z7901 Long term (current) use of anticoagulants: Secondary | ICD-10-CM | POA: Diagnosis not present

## 2011-12-17 DIAGNOSIS — I4891 Unspecified atrial fibrillation: Secondary | ICD-10-CM | POA: Diagnosis not present

## 2012-01-14 DIAGNOSIS — I4891 Unspecified atrial fibrillation: Secondary | ICD-10-CM | POA: Diagnosis not present

## 2012-01-14 DIAGNOSIS — Z7901 Long term (current) use of anticoagulants: Secondary | ICD-10-CM | POA: Diagnosis not present

## 2012-01-28 DIAGNOSIS — I4891 Unspecified atrial fibrillation: Secondary | ICD-10-CM | POA: Diagnosis not present

## 2012-01-28 DIAGNOSIS — Z7901 Long term (current) use of anticoagulants: Secondary | ICD-10-CM | POA: Diagnosis not present

## 2012-01-31 DIAGNOSIS — M069 Rheumatoid arthritis, unspecified: Secondary | ICD-10-CM | POA: Diagnosis not present

## 2012-02-07 DIAGNOSIS — L570 Actinic keratosis: Secondary | ICD-10-CM | POA: Diagnosis not present

## 2012-02-07 DIAGNOSIS — Z85828 Personal history of other malignant neoplasm of skin: Secondary | ICD-10-CM | POA: Diagnosis not present

## 2012-02-07 DIAGNOSIS — L821 Other seborrheic keratosis: Secondary | ICD-10-CM | POA: Diagnosis not present

## 2012-02-07 DIAGNOSIS — D239 Other benign neoplasm of skin, unspecified: Secondary | ICD-10-CM | POA: Diagnosis not present

## 2012-02-25 DIAGNOSIS — Z7901 Long term (current) use of anticoagulants: Secondary | ICD-10-CM | POA: Diagnosis not present

## 2012-02-25 DIAGNOSIS — I10 Essential (primary) hypertension: Secondary | ICD-10-CM | POA: Diagnosis not present

## 2012-02-25 DIAGNOSIS — I4891 Unspecified atrial fibrillation: Secondary | ICD-10-CM | POA: Diagnosis not present

## 2012-03-24 DIAGNOSIS — I10 Essential (primary) hypertension: Secondary | ICD-10-CM | POA: Diagnosis not present

## 2012-03-24 DIAGNOSIS — I4891 Unspecified atrial fibrillation: Secondary | ICD-10-CM | POA: Diagnosis not present

## 2012-03-24 DIAGNOSIS — Z7901 Long term (current) use of anticoagulants: Secondary | ICD-10-CM | POA: Diagnosis not present

## 2012-04-08 DIAGNOSIS — R5381 Other malaise: Secondary | ICD-10-CM | POA: Diagnosis not present

## 2012-04-08 DIAGNOSIS — I10 Essential (primary) hypertension: Secondary | ICD-10-CM | POA: Diagnosis not present

## 2012-04-08 DIAGNOSIS — N390 Urinary tract infection, site not specified: Secondary | ICD-10-CM | POA: Diagnosis not present

## 2012-04-15 DIAGNOSIS — I4891 Unspecified atrial fibrillation: Secondary | ICD-10-CM | POA: Diagnosis not present

## 2012-04-15 DIAGNOSIS — I10 Essential (primary) hypertension: Secondary | ICD-10-CM | POA: Diagnosis not present

## 2012-04-15 DIAGNOSIS — M159 Polyosteoarthritis, unspecified: Secondary | ICD-10-CM | POA: Diagnosis not present

## 2012-04-15 DIAGNOSIS — M069 Rheumatoid arthritis, unspecified: Secondary | ICD-10-CM | POA: Diagnosis not present

## 2012-04-21 DIAGNOSIS — I4891 Unspecified atrial fibrillation: Secondary | ICD-10-CM | POA: Diagnosis not present

## 2012-04-21 DIAGNOSIS — Z7901 Long term (current) use of anticoagulants: Secondary | ICD-10-CM | POA: Diagnosis not present

## 2012-04-21 DIAGNOSIS — I10 Essential (primary) hypertension: Secondary | ICD-10-CM | POA: Diagnosis not present

## 2012-04-28 DIAGNOSIS — K1321 Leukoplakia of oral mucosa, including tongue: Secondary | ICD-10-CM | POA: Diagnosis not present

## 2012-05-19 DIAGNOSIS — I10 Essential (primary) hypertension: Secondary | ICD-10-CM | POA: Diagnosis not present

## 2012-05-19 DIAGNOSIS — I4891 Unspecified atrial fibrillation: Secondary | ICD-10-CM | POA: Diagnosis not present

## 2012-05-19 DIAGNOSIS — Z7901 Long term (current) use of anticoagulants: Secondary | ICD-10-CM | POA: Diagnosis not present

## 2012-06-17 DIAGNOSIS — I4891 Unspecified atrial fibrillation: Secondary | ICD-10-CM | POA: Diagnosis not present

## 2012-06-17 DIAGNOSIS — I10 Essential (primary) hypertension: Secondary | ICD-10-CM | POA: Diagnosis not present

## 2012-06-17 DIAGNOSIS — Z7901 Long term (current) use of anticoagulants: Secondary | ICD-10-CM | POA: Diagnosis not present

## 2012-07-21 DIAGNOSIS — Z7901 Long term (current) use of anticoagulants: Secondary | ICD-10-CM | POA: Diagnosis not present

## 2012-07-21 DIAGNOSIS — I10 Essential (primary) hypertension: Secondary | ICD-10-CM | POA: Diagnosis not present

## 2012-07-21 DIAGNOSIS — I4891 Unspecified atrial fibrillation: Secondary | ICD-10-CM | POA: Diagnosis not present

## 2012-07-31 DIAGNOSIS — M069 Rheumatoid arthritis, unspecified: Secondary | ICD-10-CM | POA: Diagnosis not present

## 2012-08-14 DIAGNOSIS — Z85828 Personal history of other malignant neoplasm of skin: Secondary | ICD-10-CM | POA: Diagnosis not present

## 2012-08-14 DIAGNOSIS — L738 Other specified follicular disorders: Secondary | ICD-10-CM | POA: Diagnosis not present

## 2012-08-14 DIAGNOSIS — D219 Benign neoplasm of connective and other soft tissue, unspecified: Secondary | ICD-10-CM | POA: Diagnosis not present

## 2012-08-14 DIAGNOSIS — L821 Other seborrheic keratosis: Secondary | ICD-10-CM | POA: Diagnosis not present

## 2012-08-14 DIAGNOSIS — D239 Other benign neoplasm of skin, unspecified: Secondary | ICD-10-CM | POA: Diagnosis not present

## 2012-08-14 DIAGNOSIS — L570 Actinic keratosis: Secondary | ICD-10-CM | POA: Diagnosis not present

## 2012-08-19 DIAGNOSIS — I10 Essential (primary) hypertension: Secondary | ICD-10-CM | POA: Diagnosis not present

## 2012-08-19 DIAGNOSIS — I4891 Unspecified atrial fibrillation: Secondary | ICD-10-CM | POA: Diagnosis not present

## 2012-08-19 DIAGNOSIS — Z23 Encounter for immunization: Secondary | ICD-10-CM | POA: Diagnosis not present

## 2012-08-19 DIAGNOSIS — Z7901 Long term (current) use of anticoagulants: Secondary | ICD-10-CM | POA: Diagnosis not present

## 2012-09-16 DIAGNOSIS — Z7901 Long term (current) use of anticoagulants: Secondary | ICD-10-CM | POA: Diagnosis not present

## 2012-09-16 DIAGNOSIS — I10 Essential (primary) hypertension: Secondary | ICD-10-CM | POA: Diagnosis not present

## 2012-09-16 DIAGNOSIS — I4891 Unspecified atrial fibrillation: Secondary | ICD-10-CM | POA: Diagnosis not present

## 2012-10-13 DIAGNOSIS — I4891 Unspecified atrial fibrillation: Secondary | ICD-10-CM | POA: Diagnosis not present

## 2012-10-13 DIAGNOSIS — I10 Essential (primary) hypertension: Secondary | ICD-10-CM | POA: Diagnosis not present

## 2012-10-13 DIAGNOSIS — Z7901 Long term (current) use of anticoagulants: Secondary | ICD-10-CM | POA: Diagnosis not present

## 2012-10-30 DIAGNOSIS — I10 Essential (primary) hypertension: Secondary | ICD-10-CM | POA: Diagnosis not present

## 2012-10-30 DIAGNOSIS — I4891 Unspecified atrial fibrillation: Secondary | ICD-10-CM | POA: Diagnosis not present

## 2012-10-30 DIAGNOSIS — E785 Hyperlipidemia, unspecified: Secondary | ICD-10-CM | POA: Diagnosis not present

## 2012-10-30 DIAGNOSIS — Z7901 Long term (current) use of anticoagulants: Secondary | ICD-10-CM | POA: Diagnosis not present

## 2012-11-17 DIAGNOSIS — I4891 Unspecified atrial fibrillation: Secondary | ICD-10-CM | POA: Diagnosis not present

## 2012-11-17 DIAGNOSIS — I10 Essential (primary) hypertension: Secondary | ICD-10-CM | POA: Diagnosis not present

## 2012-11-17 DIAGNOSIS — Z7901 Long term (current) use of anticoagulants: Secondary | ICD-10-CM | POA: Diagnosis not present

## 2012-12-15 DIAGNOSIS — Z7901 Long term (current) use of anticoagulants: Secondary | ICD-10-CM | POA: Diagnosis not present

## 2012-12-15 DIAGNOSIS — I4891 Unspecified atrial fibrillation: Secondary | ICD-10-CM | POA: Diagnosis not present

## 2012-12-15 DIAGNOSIS — I10 Essential (primary) hypertension: Secondary | ICD-10-CM | POA: Diagnosis not present

## 2013-01-13 DIAGNOSIS — I10 Essential (primary) hypertension: Secondary | ICD-10-CM | POA: Diagnosis not present

## 2013-01-13 DIAGNOSIS — Z7901 Long term (current) use of anticoagulants: Secondary | ICD-10-CM | POA: Diagnosis not present

## 2013-01-13 DIAGNOSIS — I4891 Unspecified atrial fibrillation: Secondary | ICD-10-CM | POA: Diagnosis not present

## 2013-01-29 DIAGNOSIS — M069 Rheumatoid arthritis, unspecified: Secondary | ICD-10-CM | POA: Diagnosis not present

## 2013-02-02 DIAGNOSIS — H353190 Nonexudative age-related macular degeneration, unspecified eye, stage unspecified: Secondary | ICD-10-CM | POA: Diagnosis not present

## 2013-02-12 DIAGNOSIS — D219 Benign neoplasm of connective and other soft tissue, unspecified: Secondary | ICD-10-CM | POA: Diagnosis not present

## 2013-02-12 DIAGNOSIS — D239 Other benign neoplasm of skin, unspecified: Secondary | ICD-10-CM | POA: Diagnosis not present

## 2013-02-12 DIAGNOSIS — B079 Viral wart, unspecified: Secondary | ICD-10-CM | POA: Diagnosis not present

## 2013-02-12 DIAGNOSIS — L259 Unspecified contact dermatitis, unspecified cause: Secondary | ICD-10-CM | POA: Diagnosis not present

## 2013-02-12 DIAGNOSIS — Z85828 Personal history of other malignant neoplasm of skin: Secondary | ICD-10-CM | POA: Diagnosis not present

## 2013-02-12 DIAGNOSIS — D1801 Hemangioma of skin and subcutaneous tissue: Secondary | ICD-10-CM | POA: Diagnosis not present

## 2013-02-12 DIAGNOSIS — L821 Other seborrheic keratosis: Secondary | ICD-10-CM | POA: Diagnosis not present

## 2013-02-12 DIAGNOSIS — D485 Neoplasm of uncertain behavior of skin: Secondary | ICD-10-CM | POA: Diagnosis not present

## 2013-02-16 DIAGNOSIS — I10 Essential (primary) hypertension: Secondary | ICD-10-CM | POA: Diagnosis not present

## 2013-02-16 DIAGNOSIS — Z7901 Long term (current) use of anticoagulants: Secondary | ICD-10-CM | POA: Diagnosis not present

## 2013-02-16 DIAGNOSIS — I4891 Unspecified atrial fibrillation: Secondary | ICD-10-CM | POA: Diagnosis not present

## 2013-03-16 DIAGNOSIS — I4891 Unspecified atrial fibrillation: Secondary | ICD-10-CM | POA: Diagnosis not present

## 2013-03-16 DIAGNOSIS — Z7901 Long term (current) use of anticoagulants: Secondary | ICD-10-CM | POA: Diagnosis not present

## 2013-04-13 DIAGNOSIS — Z7901 Long term (current) use of anticoagulants: Secondary | ICD-10-CM | POA: Diagnosis not present

## 2013-04-13 DIAGNOSIS — I4891 Unspecified atrial fibrillation: Secondary | ICD-10-CM | POA: Diagnosis not present

## 2013-04-14 DIAGNOSIS — I4891 Unspecified atrial fibrillation: Secondary | ICD-10-CM | POA: Diagnosis not present

## 2013-04-14 DIAGNOSIS — I10 Essential (primary) hypertension: Secondary | ICD-10-CM | POA: Diagnosis not present

## 2013-04-14 DIAGNOSIS — M069 Rheumatoid arthritis, unspecified: Secondary | ICD-10-CM | POA: Diagnosis not present

## 2013-04-14 DIAGNOSIS — E538 Deficiency of other specified B group vitamins: Secondary | ICD-10-CM | POA: Diagnosis not present

## 2013-04-14 DIAGNOSIS — M159 Polyosteoarthritis, unspecified: Secondary | ICD-10-CM | POA: Diagnosis not present

## 2013-04-14 DIAGNOSIS — Z79899 Other long term (current) drug therapy: Secondary | ICD-10-CM | POA: Diagnosis not present

## 2013-04-14 DIAGNOSIS — R5381 Other malaise: Secondary | ICD-10-CM | POA: Diagnosis not present

## 2013-04-21 DIAGNOSIS — M069 Rheumatoid arthritis, unspecified: Secondary | ICD-10-CM | POA: Diagnosis not present

## 2013-04-21 DIAGNOSIS — M159 Polyosteoarthritis, unspecified: Secondary | ICD-10-CM | POA: Diagnosis not present

## 2013-04-21 DIAGNOSIS — I4891 Unspecified atrial fibrillation: Secondary | ICD-10-CM | POA: Diagnosis not present

## 2013-04-21 DIAGNOSIS — Z23 Encounter for immunization: Secondary | ICD-10-CM | POA: Diagnosis not present

## 2013-04-21 DIAGNOSIS — I10 Essential (primary) hypertension: Secondary | ICD-10-CM | POA: Diagnosis not present

## 2013-05-18 DIAGNOSIS — I10 Essential (primary) hypertension: Secondary | ICD-10-CM | POA: Diagnosis not present

## 2013-05-18 DIAGNOSIS — I4891 Unspecified atrial fibrillation: Secondary | ICD-10-CM | POA: Diagnosis not present

## 2013-05-18 DIAGNOSIS — Z7901 Long term (current) use of anticoagulants: Secondary | ICD-10-CM | POA: Diagnosis not present

## 2013-06-15 DIAGNOSIS — I4891 Unspecified atrial fibrillation: Secondary | ICD-10-CM | POA: Diagnosis not present

## 2013-06-15 DIAGNOSIS — Z7901 Long term (current) use of anticoagulants: Secondary | ICD-10-CM | POA: Diagnosis not present

## 2013-07-14 DIAGNOSIS — I4891 Unspecified atrial fibrillation: Secondary | ICD-10-CM | POA: Diagnosis not present

## 2013-07-14 DIAGNOSIS — Z7901 Long term (current) use of anticoagulants: Secondary | ICD-10-CM | POA: Diagnosis not present

## 2013-07-30 DIAGNOSIS — M069 Rheumatoid arthritis, unspecified: Secondary | ICD-10-CM | POA: Diagnosis not present

## 2013-08-10 DIAGNOSIS — D219 Benign neoplasm of connective and other soft tissue, unspecified: Secondary | ICD-10-CM | POA: Diagnosis not present

## 2013-08-10 DIAGNOSIS — L2089 Other atopic dermatitis: Secondary | ICD-10-CM | POA: Diagnosis not present

## 2013-08-10 DIAGNOSIS — Z85828 Personal history of other malignant neoplasm of skin: Secondary | ICD-10-CM | POA: Diagnosis not present

## 2013-08-10 DIAGNOSIS — D1801 Hemangioma of skin and subcutaneous tissue: Secondary | ICD-10-CM | POA: Diagnosis not present

## 2013-08-10 DIAGNOSIS — L819 Disorder of pigmentation, unspecified: Secondary | ICD-10-CM | POA: Diagnosis not present

## 2013-08-10 DIAGNOSIS — D239 Other benign neoplasm of skin, unspecified: Secondary | ICD-10-CM | POA: Diagnosis not present

## 2013-08-10 DIAGNOSIS — L820 Inflamed seborrheic keratosis: Secondary | ICD-10-CM | POA: Diagnosis not present

## 2013-08-17 DIAGNOSIS — Z23 Encounter for immunization: Secondary | ICD-10-CM | POA: Diagnosis not present

## 2013-08-17 DIAGNOSIS — I4891 Unspecified atrial fibrillation: Secondary | ICD-10-CM | POA: Diagnosis not present

## 2013-08-17 DIAGNOSIS — Z7901 Long term (current) use of anticoagulants: Secondary | ICD-10-CM | POA: Diagnosis not present

## 2013-09-07 DIAGNOSIS — H353190 Nonexudative age-related macular degeneration, unspecified eye, stage unspecified: Secondary | ICD-10-CM | POA: Diagnosis not present

## 2013-09-14 DIAGNOSIS — I10 Essential (primary) hypertension: Secondary | ICD-10-CM | POA: Diagnosis not present

## 2013-09-14 DIAGNOSIS — I4891 Unspecified atrial fibrillation: Secondary | ICD-10-CM | POA: Diagnosis not present

## 2013-09-14 DIAGNOSIS — Z7901 Long term (current) use of anticoagulants: Secondary | ICD-10-CM | POA: Diagnosis not present

## 2013-09-14 DIAGNOSIS — Z006 Encounter for examination for normal comparison and control in clinical research program: Secondary | ICD-10-CM | POA: Diagnosis not present

## 2013-09-14 DIAGNOSIS — M159 Polyosteoarthritis, unspecified: Secondary | ICD-10-CM | POA: Diagnosis not present

## 2013-09-14 DIAGNOSIS — M069 Rheumatoid arthritis, unspecified: Secondary | ICD-10-CM | POA: Diagnosis not present

## 2013-10-12 DIAGNOSIS — I10 Essential (primary) hypertension: Secondary | ICD-10-CM | POA: Diagnosis not present

## 2013-10-12 DIAGNOSIS — Z7901 Long term (current) use of anticoagulants: Secondary | ICD-10-CM | POA: Diagnosis not present

## 2013-10-12 DIAGNOSIS — I4891 Unspecified atrial fibrillation: Secondary | ICD-10-CM | POA: Diagnosis not present

## 2013-11-16 DIAGNOSIS — I4891 Unspecified atrial fibrillation: Secondary | ICD-10-CM | POA: Diagnosis not present

## 2013-11-16 DIAGNOSIS — Z7901 Long term (current) use of anticoagulants: Secondary | ICD-10-CM | POA: Diagnosis not present

## 2013-11-16 DIAGNOSIS — I10 Essential (primary) hypertension: Secondary | ICD-10-CM | POA: Diagnosis not present

## 2013-12-14 DIAGNOSIS — Z7901 Long term (current) use of anticoagulants: Secondary | ICD-10-CM | POA: Diagnosis not present

## 2013-12-14 DIAGNOSIS — I4891 Unspecified atrial fibrillation: Secondary | ICD-10-CM | POA: Diagnosis not present

## 2014-01-11 DIAGNOSIS — Z7901 Long term (current) use of anticoagulants: Secondary | ICD-10-CM | POA: Diagnosis not present

## 2014-01-11 DIAGNOSIS — I4891 Unspecified atrial fibrillation: Secondary | ICD-10-CM | POA: Diagnosis not present

## 2014-01-11 DIAGNOSIS — I10 Essential (primary) hypertension: Secondary | ICD-10-CM | POA: Diagnosis not present

## 2014-01-28 DIAGNOSIS — M069 Rheumatoid arthritis, unspecified: Secondary | ICD-10-CM | POA: Diagnosis not present

## 2014-02-08 DIAGNOSIS — D239 Other benign neoplasm of skin, unspecified: Secondary | ICD-10-CM | POA: Diagnosis not present

## 2014-02-08 DIAGNOSIS — L570 Actinic keratosis: Secondary | ICD-10-CM | POA: Diagnosis not present

## 2014-02-08 DIAGNOSIS — L723 Sebaceous cyst: Secondary | ICD-10-CM | POA: Diagnosis not present

## 2014-02-08 DIAGNOSIS — L821 Other seborrheic keratosis: Secondary | ICD-10-CM | POA: Diagnosis not present

## 2014-02-08 DIAGNOSIS — Z85828 Personal history of other malignant neoplasm of skin: Secondary | ICD-10-CM | POA: Diagnosis not present

## 2014-02-08 DIAGNOSIS — D219 Benign neoplasm of connective and other soft tissue, unspecified: Secondary | ICD-10-CM | POA: Diagnosis not present

## 2014-02-08 DIAGNOSIS — D1801 Hemangioma of skin and subcutaneous tissue: Secondary | ICD-10-CM | POA: Diagnosis not present

## 2014-02-24 DIAGNOSIS — I4891 Unspecified atrial fibrillation: Secondary | ICD-10-CM | POA: Diagnosis not present

## 2014-02-24 DIAGNOSIS — Z7901 Long term (current) use of anticoagulants: Secondary | ICD-10-CM | POA: Diagnosis not present

## 2014-02-24 DIAGNOSIS — I10 Essential (primary) hypertension: Secondary | ICD-10-CM | POA: Diagnosis not present

## 2014-03-01 DIAGNOSIS — H353290 Exudative age-related macular degeneration, unspecified eye, stage unspecified: Secondary | ICD-10-CM | POA: Diagnosis not present

## 2014-03-01 DIAGNOSIS — H353190 Nonexudative age-related macular degeneration, unspecified eye, stage unspecified: Secondary | ICD-10-CM | POA: Diagnosis not present

## 2014-03-01 DIAGNOSIS — H2510 Age-related nuclear cataract, unspecified eye: Secondary | ICD-10-CM | POA: Diagnosis not present

## 2014-03-01 DIAGNOSIS — H25019 Cortical age-related cataract, unspecified eye: Secondary | ICD-10-CM | POA: Diagnosis not present

## 2014-03-09 ENCOUNTER — Encounter (INDEPENDENT_AMBULATORY_CARE_PROVIDER_SITE_OTHER): Payer: Self-pay | Admitting: Ophthalmology

## 2014-03-18 ENCOUNTER — Encounter (INDEPENDENT_AMBULATORY_CARE_PROVIDER_SITE_OTHER): Payer: Medicare Other | Admitting: Ophthalmology

## 2014-03-18 DIAGNOSIS — H2510 Age-related nuclear cataract, unspecified eye: Secondary | ICD-10-CM

## 2014-03-18 DIAGNOSIS — I10 Essential (primary) hypertension: Secondary | ICD-10-CM

## 2014-03-18 DIAGNOSIS — H3530 Unspecified macular degeneration: Secondary | ICD-10-CM

## 2014-03-18 DIAGNOSIS — H43819 Vitreous degeneration, unspecified eye: Secondary | ICD-10-CM

## 2014-03-18 DIAGNOSIS — H35039 Hypertensive retinopathy, unspecified eye: Secondary | ICD-10-CM

## 2014-03-22 DIAGNOSIS — I10 Essential (primary) hypertension: Secondary | ICD-10-CM | POA: Diagnosis not present

## 2014-03-22 DIAGNOSIS — Z7901 Long term (current) use of anticoagulants: Secondary | ICD-10-CM | POA: Diagnosis not present

## 2014-03-22 DIAGNOSIS — I4891 Unspecified atrial fibrillation: Secondary | ICD-10-CM | POA: Diagnosis not present

## 2014-04-07 ENCOUNTER — Encounter (INDEPENDENT_AMBULATORY_CARE_PROVIDER_SITE_OTHER): Payer: Medicare Other | Admitting: Ophthalmology

## 2014-04-07 DIAGNOSIS — H2510 Age-related nuclear cataract, unspecified eye: Secondary | ICD-10-CM

## 2014-04-07 DIAGNOSIS — I10 Essential (primary) hypertension: Secondary | ICD-10-CM

## 2014-04-07 DIAGNOSIS — H3530 Unspecified macular degeneration: Secondary | ICD-10-CM

## 2014-04-07 DIAGNOSIS — H43819 Vitreous degeneration, unspecified eye: Secondary | ICD-10-CM | POA: Diagnosis not present

## 2014-04-07 DIAGNOSIS — H35039 Hypertensive retinopathy, unspecified eye: Secondary | ICD-10-CM

## 2014-04-19 DIAGNOSIS — Z7901 Long term (current) use of anticoagulants: Secondary | ICD-10-CM | POA: Diagnosis not present

## 2014-04-19 DIAGNOSIS — M159 Polyosteoarthritis, unspecified: Secondary | ICD-10-CM | POA: Diagnosis not present

## 2014-04-19 DIAGNOSIS — I4891 Unspecified atrial fibrillation: Secondary | ICD-10-CM | POA: Diagnosis not present

## 2014-04-19 DIAGNOSIS — I10 Essential (primary) hypertension: Secondary | ICD-10-CM | POA: Diagnosis not present

## 2014-04-26 DIAGNOSIS — I10 Essential (primary) hypertension: Secondary | ICD-10-CM | POA: Diagnosis not present

## 2014-04-26 DIAGNOSIS — I4891 Unspecified atrial fibrillation: Secondary | ICD-10-CM | POA: Diagnosis not present

## 2014-04-26 DIAGNOSIS — M159 Polyosteoarthritis, unspecified: Secondary | ICD-10-CM | POA: Diagnosis not present

## 2014-04-26 DIAGNOSIS — M069 Rheumatoid arthritis, unspecified: Secondary | ICD-10-CM | POA: Diagnosis not present

## 2014-05-17 DIAGNOSIS — Z7901 Long term (current) use of anticoagulants: Secondary | ICD-10-CM | POA: Diagnosis not present

## 2014-05-17 DIAGNOSIS — I4891 Unspecified atrial fibrillation: Secondary | ICD-10-CM | POA: Diagnosis not present

## 2014-05-19 ENCOUNTER — Encounter (INDEPENDENT_AMBULATORY_CARE_PROVIDER_SITE_OTHER): Payer: Medicare Other | Admitting: Ophthalmology

## 2014-05-19 DIAGNOSIS — I10 Essential (primary) hypertension: Secondary | ICD-10-CM | POA: Diagnosis not present

## 2014-05-19 DIAGNOSIS — H43819 Vitreous degeneration, unspecified eye: Secondary | ICD-10-CM

## 2014-05-19 DIAGNOSIS — H2510 Age-related nuclear cataract, unspecified eye: Secondary | ICD-10-CM

## 2014-05-19 DIAGNOSIS — H3530 Unspecified macular degeneration: Secondary | ICD-10-CM | POA: Diagnosis not present

## 2014-05-19 DIAGNOSIS — H35039 Hypertensive retinopathy, unspecified eye: Secondary | ICD-10-CM | POA: Diagnosis not present

## 2014-06-14 DIAGNOSIS — Z7901 Long term (current) use of anticoagulants: Secondary | ICD-10-CM | POA: Diagnosis not present

## 2014-06-14 DIAGNOSIS — I4891 Unspecified atrial fibrillation: Secondary | ICD-10-CM | POA: Diagnosis not present

## 2014-07-13 DIAGNOSIS — I4891 Unspecified atrial fibrillation: Secondary | ICD-10-CM | POA: Diagnosis not present

## 2014-07-13 DIAGNOSIS — Z7901 Long term (current) use of anticoagulants: Secondary | ICD-10-CM | POA: Diagnosis not present

## 2014-07-29 DIAGNOSIS — Z23 Encounter for immunization: Secondary | ICD-10-CM | POA: Diagnosis not present

## 2014-07-29 DIAGNOSIS — M069 Rheumatoid arthritis, unspecified: Secondary | ICD-10-CM | POA: Diagnosis not present

## 2014-08-16 DIAGNOSIS — L821 Other seborrheic keratosis: Secondary | ICD-10-CM | POA: Diagnosis not present

## 2014-08-16 DIAGNOSIS — Z7901 Long term (current) use of anticoagulants: Secondary | ICD-10-CM | POA: Diagnosis not present

## 2014-08-16 DIAGNOSIS — L570 Actinic keratosis: Secondary | ICD-10-CM | POA: Diagnosis not present

## 2014-08-16 DIAGNOSIS — I481 Persistent atrial fibrillation: Secondary | ICD-10-CM | POA: Diagnosis not present

## 2014-08-16 DIAGNOSIS — C44519 Basal cell carcinoma of skin of other part of trunk: Secondary | ICD-10-CM | POA: Diagnosis not present

## 2014-08-16 DIAGNOSIS — D485 Neoplasm of uncertain behavior of skin: Secondary | ICD-10-CM | POA: Diagnosis not present

## 2014-08-16 DIAGNOSIS — Z85828 Personal history of other malignant neoplasm of skin: Secondary | ICD-10-CM | POA: Diagnosis not present

## 2014-08-16 DIAGNOSIS — I4891 Unspecified atrial fibrillation: Secondary | ICD-10-CM | POA: Diagnosis not present

## 2014-08-16 DIAGNOSIS — D3617 Benign neoplasm of peripheral nerves and autonomic nervous system of trunk, unspecified: Secondary | ICD-10-CM | POA: Diagnosis not present

## 2014-08-23 DIAGNOSIS — H02839 Dermatochalasis of unspecified eye, unspecified eyelid: Secondary | ICD-10-CM | POA: Diagnosis not present

## 2014-08-23 DIAGNOSIS — H25012 Cortical age-related cataract, left eye: Secondary | ICD-10-CM | POA: Diagnosis not present

## 2014-08-23 DIAGNOSIS — H3530 Unspecified macular degeneration: Secondary | ICD-10-CM | POA: Diagnosis not present

## 2014-08-23 DIAGNOSIS — H25013 Cortical age-related cataract, bilateral: Secondary | ICD-10-CM | POA: Diagnosis not present

## 2014-08-23 DIAGNOSIS — H2513 Age-related nuclear cataract, bilateral: Secondary | ICD-10-CM | POA: Diagnosis not present

## 2014-08-23 DIAGNOSIS — H25043 Posterior subcapsular polar age-related cataract, bilateral: Secondary | ICD-10-CM | POA: Diagnosis not present

## 2014-08-23 DIAGNOSIS — H3532 Exudative age-related macular degeneration: Secondary | ICD-10-CM | POA: Diagnosis not present

## 2014-08-24 ENCOUNTER — Encounter (INDEPENDENT_AMBULATORY_CARE_PROVIDER_SITE_OTHER): Payer: Medicare Other | Admitting: Ophthalmology

## 2014-08-24 DIAGNOSIS — I10 Essential (primary) hypertension: Secondary | ICD-10-CM

## 2014-08-24 DIAGNOSIS — H35033 Hypertensive retinopathy, bilateral: Secondary | ICD-10-CM | POA: Diagnosis not present

## 2014-08-24 DIAGNOSIS — H3531 Nonexudative age-related macular degeneration: Secondary | ICD-10-CM | POA: Diagnosis not present

## 2014-08-24 DIAGNOSIS — H3532 Exudative age-related macular degeneration: Secondary | ICD-10-CM | POA: Diagnosis not present

## 2014-08-24 DIAGNOSIS — H43813 Vitreous degeneration, bilateral: Secondary | ICD-10-CM

## 2014-09-13 DIAGNOSIS — Z7901 Long term (current) use of anticoagulants: Secondary | ICD-10-CM | POA: Diagnosis not present

## 2014-09-13 DIAGNOSIS — I4891 Unspecified atrial fibrillation: Secondary | ICD-10-CM | POA: Diagnosis not present

## 2014-09-20 ENCOUNTER — Encounter (INDEPENDENT_AMBULATORY_CARE_PROVIDER_SITE_OTHER): Payer: Medicare Other | Admitting: Ophthalmology

## 2014-09-20 DIAGNOSIS — H35033 Hypertensive retinopathy, bilateral: Secondary | ICD-10-CM | POA: Diagnosis not present

## 2014-09-20 DIAGNOSIS — H43813 Vitreous degeneration, bilateral: Secondary | ICD-10-CM

## 2014-09-20 DIAGNOSIS — H3531 Nonexudative age-related macular degeneration: Secondary | ICD-10-CM

## 2014-09-20 DIAGNOSIS — I10 Essential (primary) hypertension: Secondary | ICD-10-CM

## 2014-09-20 DIAGNOSIS — H3532 Exudative age-related macular degeneration: Secondary | ICD-10-CM | POA: Diagnosis not present

## 2014-09-21 DIAGNOSIS — H25012 Cortical age-related cataract, left eye: Secondary | ICD-10-CM | POA: Diagnosis not present

## 2014-09-21 DIAGNOSIS — H2512 Age-related nuclear cataract, left eye: Secondary | ICD-10-CM | POA: Diagnosis not present

## 2014-09-22 DIAGNOSIS — Z961 Presence of intraocular lens: Secondary | ICD-10-CM | POA: Diagnosis not present

## 2014-09-28 DIAGNOSIS — H25011 Cortical age-related cataract, right eye: Secondary | ICD-10-CM | POA: Diagnosis not present

## 2014-09-28 DIAGNOSIS — H2511 Age-related nuclear cataract, right eye: Secondary | ICD-10-CM | POA: Diagnosis not present

## 2014-09-28 DIAGNOSIS — H2512 Age-related nuclear cataract, left eye: Secondary | ICD-10-CM | POA: Diagnosis not present

## 2014-10-12 DIAGNOSIS — Z7901 Long term (current) use of anticoagulants: Secondary | ICD-10-CM | POA: Diagnosis not present

## 2014-10-12 DIAGNOSIS — I4891 Unspecified atrial fibrillation: Secondary | ICD-10-CM | POA: Diagnosis not present

## 2014-10-18 ENCOUNTER — Encounter (INDEPENDENT_AMBULATORY_CARE_PROVIDER_SITE_OTHER): Payer: Medicare Other | Admitting: Ophthalmology

## 2014-10-18 DIAGNOSIS — H3532 Exudative age-related macular degeneration: Secondary | ICD-10-CM | POA: Diagnosis not present

## 2014-10-18 DIAGNOSIS — H3531 Nonexudative age-related macular degeneration: Secondary | ICD-10-CM

## 2014-10-18 DIAGNOSIS — H35033 Hypertensive retinopathy, bilateral: Secondary | ICD-10-CM

## 2014-10-18 DIAGNOSIS — I10 Essential (primary) hypertension: Secondary | ICD-10-CM

## 2014-10-18 DIAGNOSIS — H43813 Vitreous degeneration, bilateral: Secondary | ICD-10-CM

## 2014-10-21 DIAGNOSIS — Z7901 Long term (current) use of anticoagulants: Secondary | ICD-10-CM | POA: Diagnosis not present

## 2014-10-21 DIAGNOSIS — I482 Chronic atrial fibrillation: Secondary | ICD-10-CM | POA: Diagnosis not present

## 2014-10-21 DIAGNOSIS — I10 Essential (primary) hypertension: Secondary | ICD-10-CM | POA: Diagnosis not present

## 2014-10-21 DIAGNOSIS — E785 Hyperlipidemia, unspecified: Secondary | ICD-10-CM | POA: Diagnosis not present

## 2014-11-15 ENCOUNTER — Encounter (INDEPENDENT_AMBULATORY_CARE_PROVIDER_SITE_OTHER): Payer: Medicare Other | Admitting: Ophthalmology

## 2014-11-15 DIAGNOSIS — H3531 Nonexudative age-related macular degeneration: Secondary | ICD-10-CM | POA: Diagnosis not present

## 2014-11-15 DIAGNOSIS — I10 Essential (primary) hypertension: Secondary | ICD-10-CM | POA: Diagnosis not present

## 2014-11-15 DIAGNOSIS — H3532 Exudative age-related macular degeneration: Secondary | ICD-10-CM | POA: Diagnosis not present

## 2014-11-15 DIAGNOSIS — H43813 Vitreous degeneration, bilateral: Secondary | ICD-10-CM

## 2014-11-15 DIAGNOSIS — H35033 Hypertensive retinopathy, bilateral: Secondary | ICD-10-CM | POA: Diagnosis not present

## 2014-11-16 DIAGNOSIS — Z7901 Long term (current) use of anticoagulants: Secondary | ICD-10-CM | POA: Diagnosis not present

## 2014-11-16 DIAGNOSIS — I4891 Unspecified atrial fibrillation: Secondary | ICD-10-CM | POA: Diagnosis not present

## 2014-12-13 DIAGNOSIS — I4891 Unspecified atrial fibrillation: Secondary | ICD-10-CM | POA: Diagnosis not present

## 2014-12-13 DIAGNOSIS — Z7901 Long term (current) use of anticoagulants: Secondary | ICD-10-CM | POA: Diagnosis not present

## 2014-12-23 ENCOUNTER — Encounter (INDEPENDENT_AMBULATORY_CARE_PROVIDER_SITE_OTHER): Payer: Medicare Other | Admitting: Ophthalmology

## 2014-12-23 DIAGNOSIS — H35033 Hypertensive retinopathy, bilateral: Secondary | ICD-10-CM | POA: Diagnosis not present

## 2014-12-23 DIAGNOSIS — H43813 Vitreous degeneration, bilateral: Secondary | ICD-10-CM

## 2014-12-23 DIAGNOSIS — H3532 Exudative age-related macular degeneration: Secondary | ICD-10-CM

## 2014-12-23 DIAGNOSIS — H3531 Nonexudative age-related macular degeneration: Secondary | ICD-10-CM

## 2014-12-23 DIAGNOSIS — I10 Essential (primary) hypertension: Secondary | ICD-10-CM | POA: Diagnosis not present

## 2014-12-27 ENCOUNTER — Encounter (INDEPENDENT_AMBULATORY_CARE_PROVIDER_SITE_OTHER): Payer: Medicare Other | Admitting: Ophthalmology

## 2015-01-11 DIAGNOSIS — Z7901 Long term (current) use of anticoagulants: Secondary | ICD-10-CM | POA: Diagnosis not present

## 2015-01-11 DIAGNOSIS — I4891 Unspecified atrial fibrillation: Secondary | ICD-10-CM | POA: Diagnosis not present

## 2015-01-24 DIAGNOSIS — M0589 Other rheumatoid arthritis with rheumatoid factor of multiple sites: Secondary | ICD-10-CM | POA: Diagnosis not present

## 2015-01-27 ENCOUNTER — Encounter (INDEPENDENT_AMBULATORY_CARE_PROVIDER_SITE_OTHER): Payer: Medicare Other | Admitting: Ophthalmology

## 2015-01-27 DIAGNOSIS — H3531 Nonexudative age-related macular degeneration: Secondary | ICD-10-CM | POA: Diagnosis not present

## 2015-01-27 DIAGNOSIS — I10 Essential (primary) hypertension: Secondary | ICD-10-CM | POA: Diagnosis not present

## 2015-01-27 DIAGNOSIS — H43813 Vitreous degeneration, bilateral: Secondary | ICD-10-CM

## 2015-01-27 DIAGNOSIS — H3532 Exudative age-related macular degeneration: Secondary | ICD-10-CM | POA: Diagnosis not present

## 2015-01-27 DIAGNOSIS — H35033 Hypertensive retinopathy, bilateral: Secondary | ICD-10-CM

## 2015-02-14 DIAGNOSIS — Z7901 Long term (current) use of anticoagulants: Secondary | ICD-10-CM | POA: Diagnosis not present

## 2015-02-14 DIAGNOSIS — I4891 Unspecified atrial fibrillation: Secondary | ICD-10-CM | POA: Diagnosis not present

## 2015-02-17 DIAGNOSIS — D692 Other nonthrombocytopenic purpura: Secondary | ICD-10-CM | POA: Diagnosis not present

## 2015-02-17 DIAGNOSIS — D3617 Benign neoplasm of peripheral nerves and autonomic nervous system of trunk, unspecified: Secondary | ICD-10-CM | POA: Diagnosis not present

## 2015-02-17 DIAGNOSIS — L570 Actinic keratosis: Secondary | ICD-10-CM | POA: Diagnosis not present

## 2015-02-17 DIAGNOSIS — L821 Other seborrheic keratosis: Secondary | ICD-10-CM | POA: Diagnosis not present

## 2015-02-17 DIAGNOSIS — D1801 Hemangioma of skin and subcutaneous tissue: Secondary | ICD-10-CM | POA: Diagnosis not present

## 2015-02-17 DIAGNOSIS — Z85828 Personal history of other malignant neoplasm of skin: Secondary | ICD-10-CM | POA: Diagnosis not present

## 2015-02-24 ENCOUNTER — Encounter (INDEPENDENT_AMBULATORY_CARE_PROVIDER_SITE_OTHER): Payer: Medicare Other | Admitting: Ophthalmology

## 2015-02-24 DIAGNOSIS — H3531 Nonexudative age-related macular degeneration: Secondary | ICD-10-CM | POA: Diagnosis not present

## 2015-02-24 DIAGNOSIS — H35033 Hypertensive retinopathy, bilateral: Secondary | ICD-10-CM

## 2015-02-24 DIAGNOSIS — H26492 Other secondary cataract, left eye: Secondary | ICD-10-CM

## 2015-02-24 DIAGNOSIS — H43813 Vitreous degeneration, bilateral: Secondary | ICD-10-CM | POA: Diagnosis not present

## 2015-02-24 DIAGNOSIS — I10 Essential (primary) hypertension: Secondary | ICD-10-CM | POA: Diagnosis not present

## 2015-02-24 DIAGNOSIS — H3532 Exudative age-related macular degeneration: Secondary | ICD-10-CM

## 2015-03-09 ENCOUNTER — Emergency Department (HOSPITAL_COMMUNITY): Payer: Medicare Other

## 2015-03-09 ENCOUNTER — Encounter (HOSPITAL_COMMUNITY): Payer: Self-pay | Admitting: Emergency Medicine

## 2015-03-09 ENCOUNTER — Emergency Department (HOSPITAL_COMMUNITY)
Admission: EM | Admit: 2015-03-09 | Discharge: 2015-03-09 | Disposition: A | Discharge status: Home / Self Care | Payer: Medicare Other | Source: Home / Self Care | Attending: Emergency Medicine | Admitting: Emergency Medicine

## 2015-03-09 DIAGNOSIS — Y929 Unspecified place or not applicable: Secondary | ICD-10-CM | POA: Insufficient documentation

## 2015-03-09 DIAGNOSIS — E222 Syndrome of inappropriate secretion of antidiuretic hormone: Secondary | ICD-10-CM | POA: Diagnosis not present

## 2015-03-09 DIAGNOSIS — S0003XA Contusion of scalp, initial encounter: Secondary | ICD-10-CM | POA: Diagnosis present

## 2015-03-09 DIAGNOSIS — M47812 Spondylosis without myelopathy or radiculopathy, cervical region: Secondary | ICD-10-CM | POA: Diagnosis not present

## 2015-03-09 DIAGNOSIS — K222 Esophageal obstruction: Secondary | ICD-10-CM | POA: Diagnosis present

## 2015-03-09 DIAGNOSIS — W01198A Fall on same level from slipping, tripping and stumbling with subsequent striking against other object, initial encounter: Secondary | ICD-10-CM

## 2015-03-09 DIAGNOSIS — Z91013 Allergy to seafood: Secondary | ICD-10-CM

## 2015-03-09 DIAGNOSIS — D500 Iron deficiency anemia secondary to blood loss (chronic): Secondary | ICD-10-CM | POA: Diagnosis present

## 2015-03-09 DIAGNOSIS — Z720 Tobacco use: Secondary | ICD-10-CM

## 2015-03-09 DIAGNOSIS — T45515A Adverse effect of anticoagulants, initial encounter: Secondary | ICD-10-CM | POA: Diagnosis present

## 2015-03-09 DIAGNOSIS — I471 Supraventricular tachycardia: Secondary | ICD-10-CM | POA: Diagnosis not present

## 2015-03-09 DIAGNOSIS — Z9849 Cataract extraction status, unspecified eye: Secondary | ICD-10-CM

## 2015-03-09 DIAGNOSIS — I4891 Unspecified atrial fibrillation: Secondary | ICD-10-CM | POA: Diagnosis not present

## 2015-03-09 DIAGNOSIS — E871 Hypo-osmolality and hyponatremia: Secondary | ICD-10-CM | POA: Diagnosis not present

## 2015-03-09 DIAGNOSIS — R51 Headache: Secondary | ICD-10-CM | POA: Diagnosis not present

## 2015-03-09 DIAGNOSIS — N390 Urinary tract infection, site not specified: Secondary | ICD-10-CM | POA: Insufficient documentation

## 2015-03-09 DIAGNOSIS — M4312 Spondylolisthesis, cervical region: Secondary | ICD-10-CM | POA: Diagnosis not present

## 2015-03-09 DIAGNOSIS — S99922A Unspecified injury of left foot, initial encounter: Secondary | ICD-10-CM | POA: Insufficient documentation

## 2015-03-09 DIAGNOSIS — Z8739 Personal history of other diseases of the musculoskeletal system and connective tissue: Secondary | ICD-10-CM

## 2015-03-09 DIAGNOSIS — Y999 Unspecified external cause status: Secondary | ICD-10-CM

## 2015-03-09 DIAGNOSIS — Y939 Activity, unspecified: Secondary | ICD-10-CM

## 2015-03-09 DIAGNOSIS — R42 Dizziness and giddiness: Secondary | ICD-10-CM

## 2015-03-09 DIAGNOSIS — S098XXA Other specified injuries of head, initial encounter: Secondary | ICD-10-CM | POA: Diagnosis not present

## 2015-03-09 DIAGNOSIS — Z8719 Personal history of other diseases of the digestive system: Secondary | ICD-10-CM

## 2015-03-09 DIAGNOSIS — K208 Other esophagitis: Secondary | ICD-10-CM | POA: Diagnosis present

## 2015-03-09 DIAGNOSIS — K922 Gastrointestinal hemorrhage, unspecified: Principal | ICD-10-CM | POA: Diagnosis present

## 2015-03-09 DIAGNOSIS — Z7901 Long term (current) use of anticoagulants: Secondary | ICD-10-CM

## 2015-03-09 DIAGNOSIS — Z882 Allergy status to sulfonamides status: Secondary | ICD-10-CM

## 2015-03-09 DIAGNOSIS — M542 Cervicalgia: Secondary | ICD-10-CM | POA: Diagnosis not present

## 2015-03-09 DIAGNOSIS — S2242XA Multiple fractures of ribs, left side, initial encounter for closed fracture: Secondary | ICD-10-CM | POA: Diagnosis not present

## 2015-03-09 DIAGNOSIS — S79911A Unspecified injury of right hip, initial encounter: Secondary | ICD-10-CM | POA: Diagnosis not present

## 2015-03-09 DIAGNOSIS — R262 Difficulty in walking, not elsewhere classified: Secondary | ICD-10-CM | POA: Diagnosis present

## 2015-03-09 DIAGNOSIS — Z79899 Other long term (current) drug therapy: Secondary | ICD-10-CM | POA: Insufficient documentation

## 2015-03-09 DIAGNOSIS — M25551 Pain in right hip: Secondary | ICD-10-CM | POA: Diagnosis not present

## 2015-03-09 DIAGNOSIS — S199XXA Unspecified injury of neck, initial encounter: Secondary | ICD-10-CM | POA: Diagnosis not present

## 2015-03-09 DIAGNOSIS — Z66 Do not resuscitate: Secondary | ICD-10-CM | POA: Diagnosis present

## 2015-03-09 DIAGNOSIS — M069 Rheumatoid arthritis, unspecified: Secondary | ICD-10-CM | POA: Diagnosis present

## 2015-03-09 DIAGNOSIS — E860 Dehydration: Secondary | ICD-10-CM | POA: Diagnosis present

## 2015-03-09 DIAGNOSIS — K920 Hematemesis: Secondary | ICD-10-CM | POA: Diagnosis present

## 2015-03-09 DIAGNOSIS — I4892 Unspecified atrial flutter: Secondary | ICD-10-CM | POA: Diagnosis present

## 2015-03-09 DIAGNOSIS — M79604 Pain in right leg: Secondary | ICD-10-CM | POA: Diagnosis not present

## 2015-03-09 DIAGNOSIS — F17210 Nicotine dependence, cigarettes, uncomplicated: Secondary | ICD-10-CM | POA: Diagnosis present

## 2015-03-09 DIAGNOSIS — R531 Weakness: Secondary | ICD-10-CM | POA: Diagnosis not present

## 2015-03-09 DIAGNOSIS — S0083XA Contusion of other part of head, initial encounter: Secondary | ICD-10-CM | POA: Diagnosis not present

## 2015-03-09 DIAGNOSIS — W1830XA Fall on same level, unspecified, initial encounter: Secondary | ICD-10-CM | POA: Diagnosis present

## 2015-03-09 HISTORY — DX: Rheumatoid arthritis, unspecified: M06.9

## 2015-03-09 HISTORY — DX: Unspecified atrial fibrillation: I48.91

## 2015-03-09 LAB — URINALYSIS, ROUTINE W REFLEX MICROSCOPIC
BILIRUBIN URINE: NEGATIVE
Glucose, UA: NEGATIVE mg/dL
Ketones, ur: NEGATIVE mg/dL
NITRITE: POSITIVE — AB
PH: 7.5 (ref 5.0–8.0)
Protein, ur: 30 mg/dL — AB
SPECIFIC GRAVITY, URINE: 1.01 (ref 1.005–1.030)
Urobilinogen, UA: 0.2 mg/dL (ref 0.0–1.0)

## 2015-03-09 LAB — BASIC METABOLIC PANEL
ANION GAP: 7 (ref 5–15)
BUN: 18 mg/dL (ref 6–23)
CALCIUM: 9.1 mg/dL (ref 8.4–10.5)
CO2: 24 mmol/L (ref 19–32)
Chloride: 102 mmol/L (ref 96–112)
Creatinine, Ser: 1.19 mg/dL (ref 0.50–1.35)
GFR calc non Af Amer: 52 mL/min — ABNORMAL LOW (ref >90)
GFR, EST AFRICAN AMERICAN: 61 mL/min — AB (ref >90)
Glucose, Bld: 123 mg/dL — ABNORMAL HIGH (ref 70–99)
Potassium: 4.4 mmol/L (ref 3.5–5.1)
SODIUM: 133 mmol/L — AB (ref 135–145)

## 2015-03-09 LAB — PROTIME-INR
INR: 2.17 — AB (ref 0.00–1.49)
PROTHROMBIN TIME: 24.4 s — AB (ref 11.6–15.2)

## 2015-03-09 LAB — I-STAT CHEM 8, ED
BUN: 20 mg/dL (ref 6–23)
CALCIUM ION: 1.17 mmol/L (ref 1.13–1.30)
Chloride: 101 mmol/L (ref 96–112)
Creatinine, Ser: 1.1 mg/dL (ref 0.50–1.35)
Glucose, Bld: 124 mg/dL — ABNORMAL HIGH (ref 70–99)
HCT: 43 % (ref 39.0–52.0)
Hemoglobin: 14.6 g/dL (ref 13.0–17.0)
Potassium: 4.3 mmol/L (ref 3.5–5.1)
Sodium: 135 mmol/L (ref 135–145)
TCO2: 20 mmol/L (ref 0–100)

## 2015-03-09 LAB — CBC WITH DIFFERENTIAL/PLATELET
BASOS ABS: 0 10*3/uL (ref 0.0–0.1)
BASOS PCT: 0 % (ref 0–1)
EOS ABS: 0.2 10*3/uL (ref 0.0–0.7)
EOS PCT: 2 % (ref 0–5)
HEMATOCRIT: 37 % — AB (ref 39.0–52.0)
HEMOGLOBIN: 12.8 g/dL — AB (ref 13.0–17.0)
Lymphocytes Relative: 8 % — ABNORMAL LOW (ref 12–46)
Lymphs Abs: 0.9 10*3/uL (ref 0.7–4.0)
MCH: 33.2 pg (ref 26.0–34.0)
MCHC: 34.6 g/dL (ref 30.0–36.0)
MCV: 96.1 fL (ref 78.0–100.0)
MONO ABS: 1 10*3/uL (ref 0.1–1.0)
MONOS PCT: 9 % (ref 3–12)
Neutro Abs: 9.3 10*3/uL — ABNORMAL HIGH (ref 1.7–7.7)
Neutrophils Relative %: 81 % — ABNORMAL HIGH (ref 43–77)
Platelets: 210 10*3/uL (ref 150–400)
RBC: 3.85 MIL/uL — ABNORMAL LOW (ref 4.22–5.81)
RDW: 14.3 % (ref 11.5–15.5)
WBC: 11.3 10*3/uL — ABNORMAL HIGH (ref 4.0–10.5)

## 2015-03-09 LAB — URINE MICROSCOPIC-ADD ON

## 2015-03-09 IMAGING — CT CT CERVICAL SPINE W/O CM
3 of 6 series · 11 of 35 positions shown, 13 images · non-contrast
Comparison: None.

CLINICAL DATA: Patient became dizzy and fell, hitting head on bed
frame. Complaining of head and neck pain

EXAM:
CT HEAD WITHOUT CONTRAST
CT CERVICAL SPINE WITHOUT CONTRAST
TECHNIQUE: Multidetector CT imaging of the head and cervical spine was
performed following the standard protocol without intravenous
contrast. Multiplanar CT image reconstructions of the cervical spine
were also generated.

[Series 304: cor · coronal · 0.39mm/px · 3 of 48 slices shown]
[im 10/48  bone]
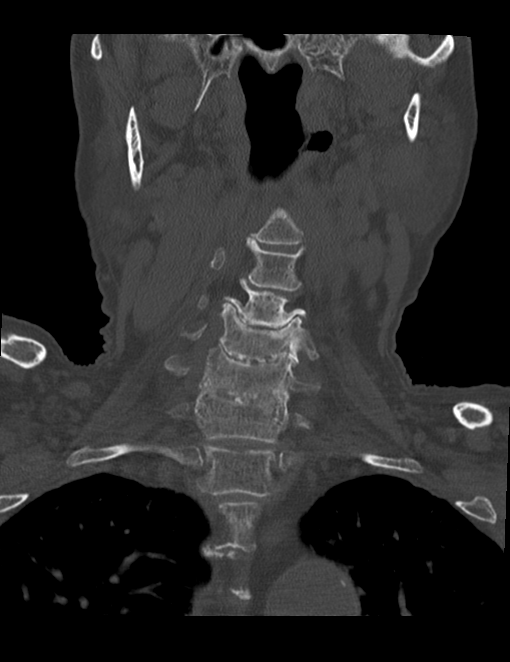
[im 19/48  bone]
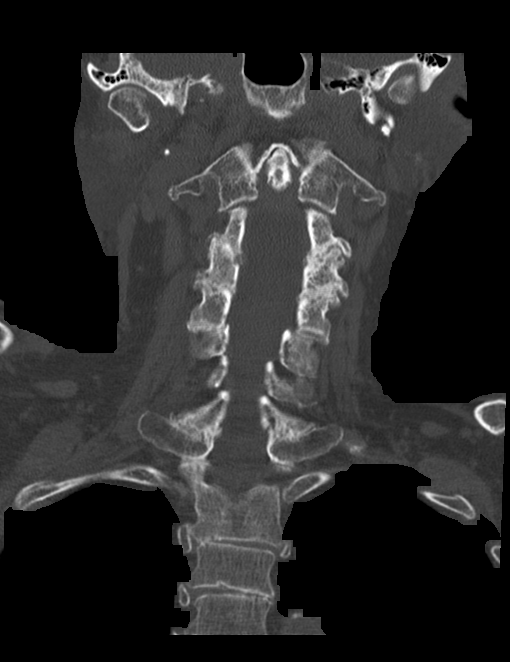
[im 29/48  bone]
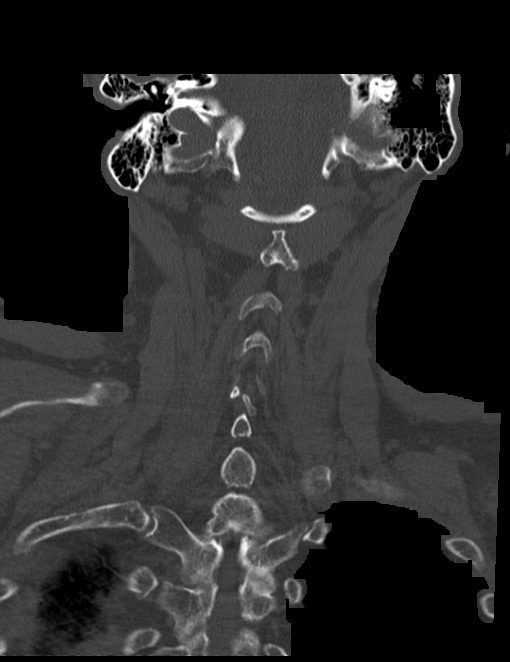

[Series 305: sag · sagittal · 0.39mm/px · 5 of 54 slices shown, 6 images]
[im 18/54  bone]
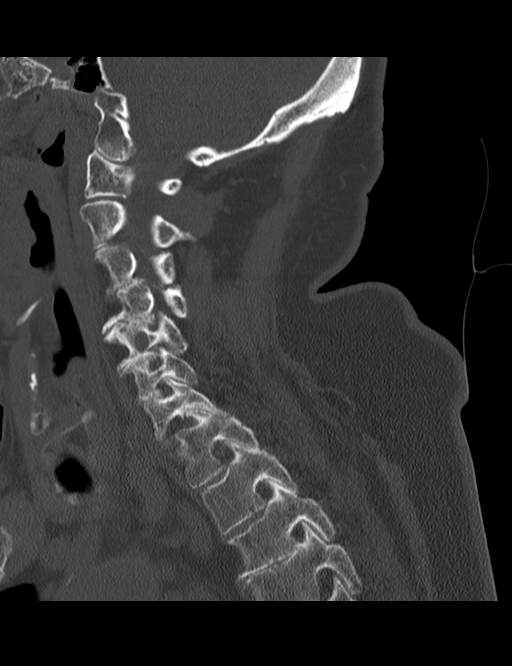
[im 23/54  bone]
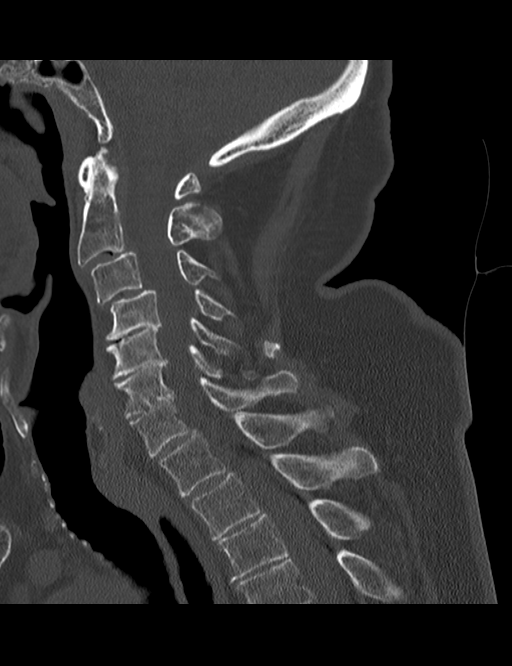
[im 27/54  soft-tissue]
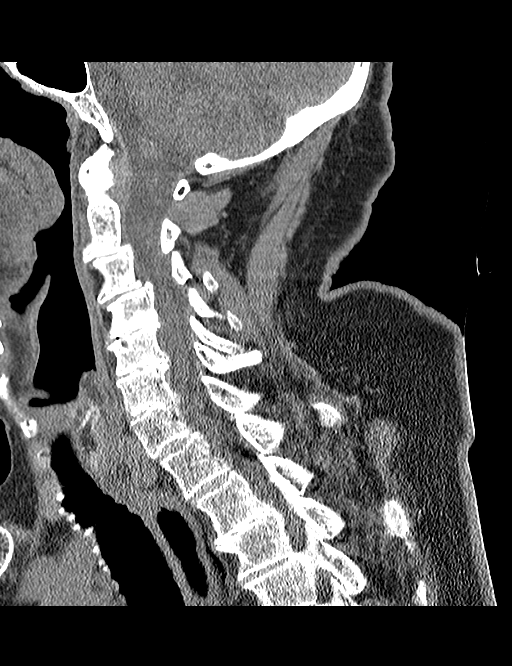
[im 27/54  bone]
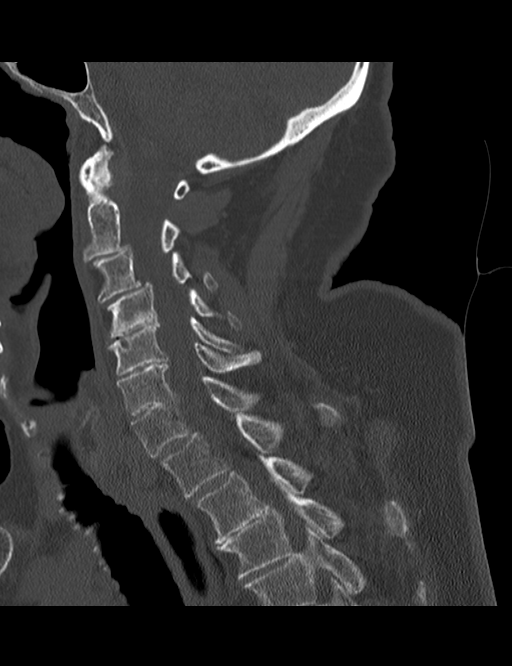
[im 31/54  bone]
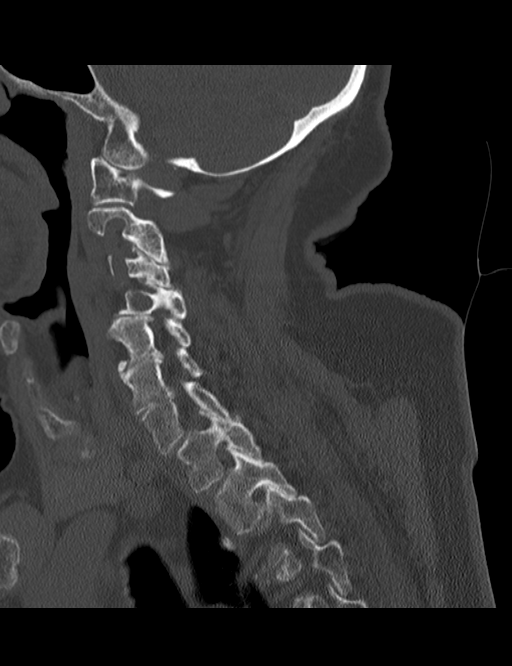
[im 36/54  bone]
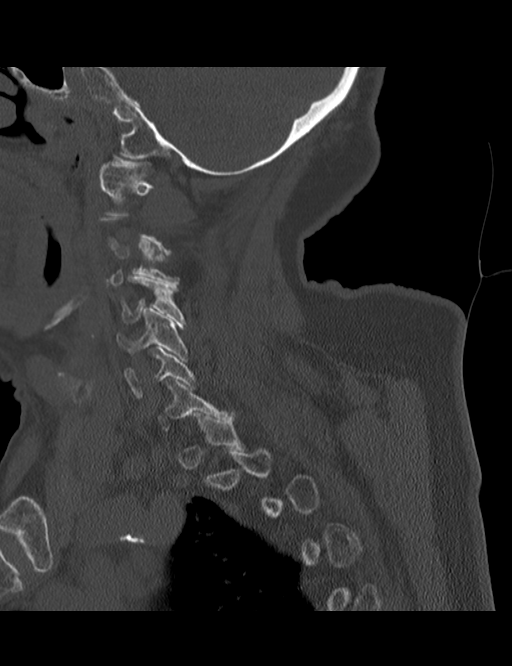

[Series 306: ang.axials · axial · 0.39mm/px · z∈[+30,+115]mm · 3 of 98 slices shown, 4 images]
[im 25/98  soft-tissue]
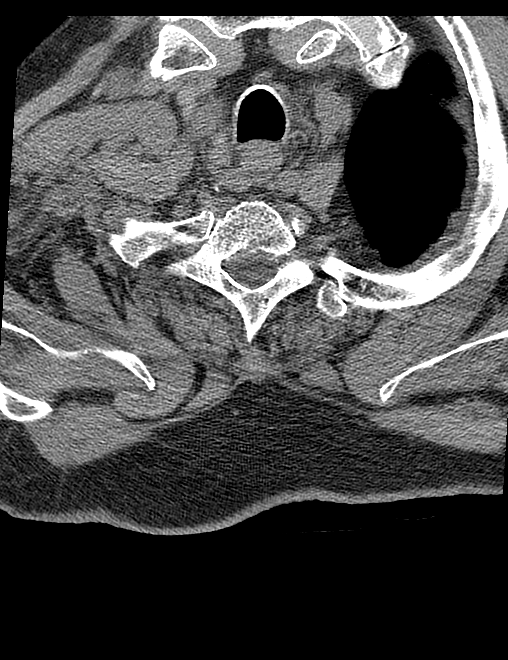
[im 25/98  bone]
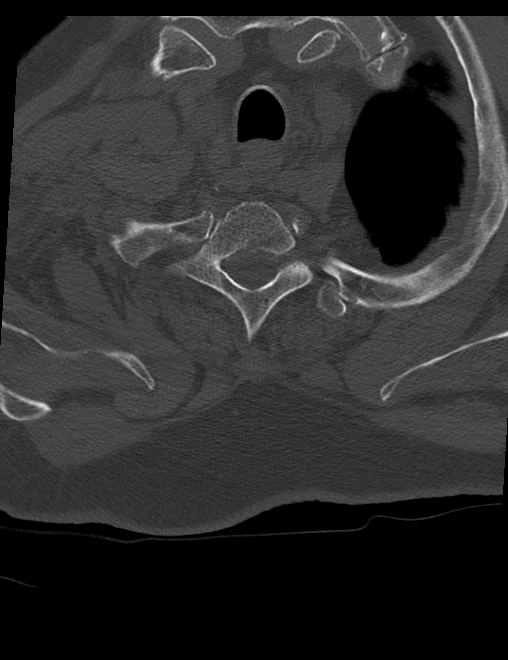
[im 49/98  bone]
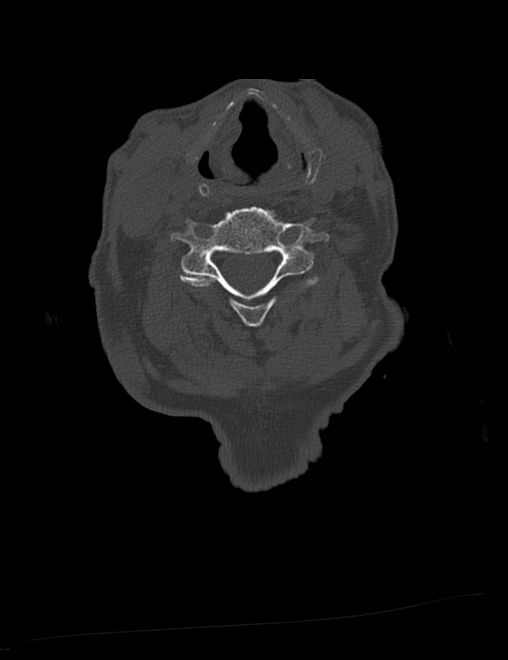
[im 73/98  bone]
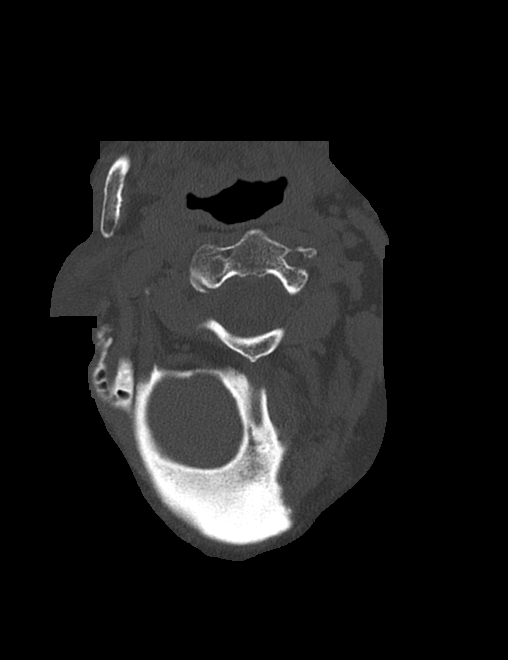

[11 of 35 positions shown; findings below may reference images not displayed]

FINDINGS: CT HEAD FINDINGS

There is moderate diffuse atrophy. There is no intracranial mass,
hemorrhage, extra-axial fluid collection, or midline shift. There is
mild small vessel disease in the centra semiovale bilaterally. No
acute infarct apparent. Bony calvarium appears intact. The mastoid
air cells are clear. There is a right frontal scalp hematoma. There
is mild mucosal thickening in the inferior right maxillary antrum.

CT CERVICAL SPINE FINDINGS

There is no apparent fracture. There is 3 mm of anterolisthesis of
C2 on C3. There is 5 mm of anterolisthesis of C3 on C4. There is 1
mm of retrolisthesis of C4 on C5. There is 1 mm of retrolisthesis of
C5 on C6. There is 2 mm of anterolisthesis of C7 on T1. These areas
of spondylolisthesis are felt to be secondary to underlying
spondylosis. The prevertebral soft tissues and predental space
regions are normal. There is severe disc space narrowing at C4-5,
C5-6, and C6-7. There is moderate narrowing at all other levels.
There is facet hypertrophy to varying degrees at all levels
bilaterally. There is no frank disc extrusion or stenosis. There is
exit foraminal narrowing due to bony hypertrophy at multiple levels,
most marked at C5-6 on the left.
IMPRESSION: CT head: Atrophy with mild periventricular small vessel disease. No
intracranial mass, hemorrhage, or subdural/epidural hematoma. No
acute infarct apparent. Right frontal scalp hematoma.

CT cervical spine: Extensive spondylosis. Mild spondylolisthesis at
most levels, felt to be due to spondylosis. No fracture apparent.

## 2015-03-09 IMAGING — CR DG RIBS W/ CHEST 3+V*L*
5 series · 5 of 5 positions shown · non-contrast
Comparison: Single view of the chest [DATE]. CT chest
[DATE].

CLINICAL DATA: Status post fall today.  Anterior left rib pain.

EXAM:
LEFT RIBS AND CHEST - 3+ VIEW

[chest pa]
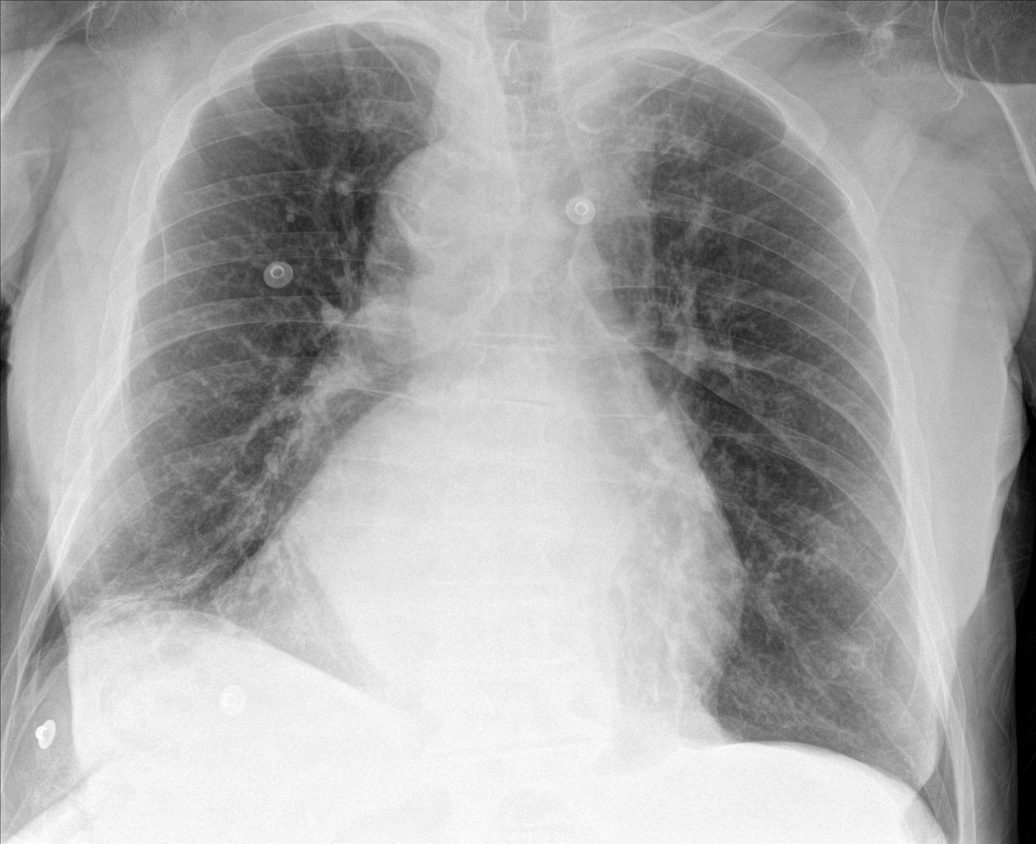

[rib ap (1 of 2)]
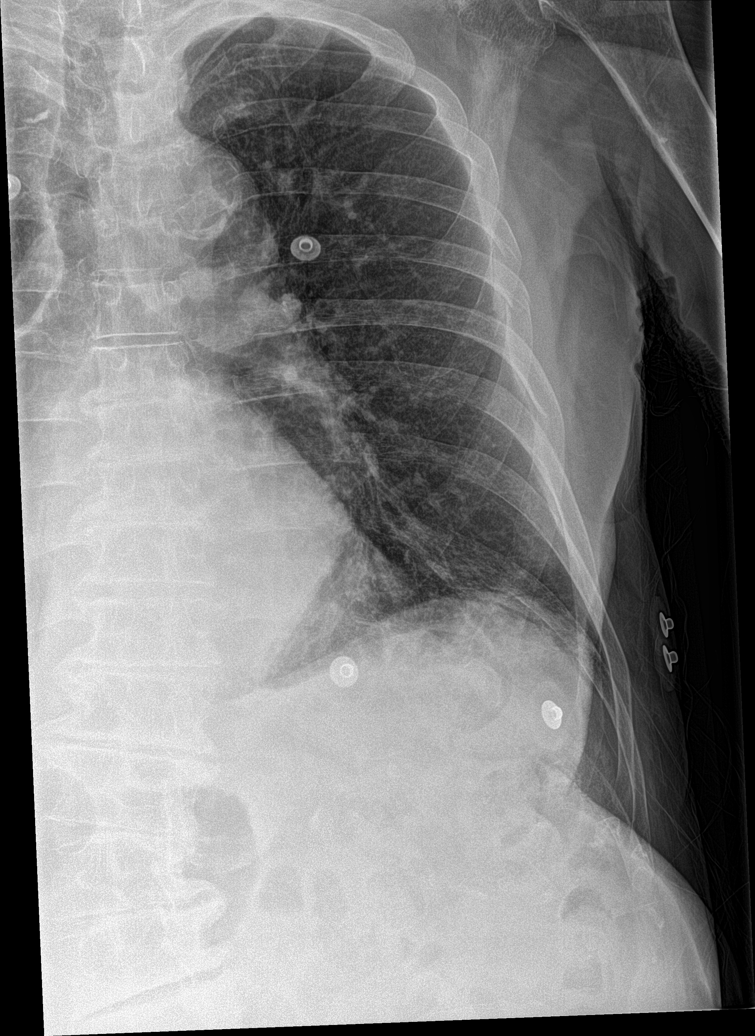

[rib ap (2 of 2)]
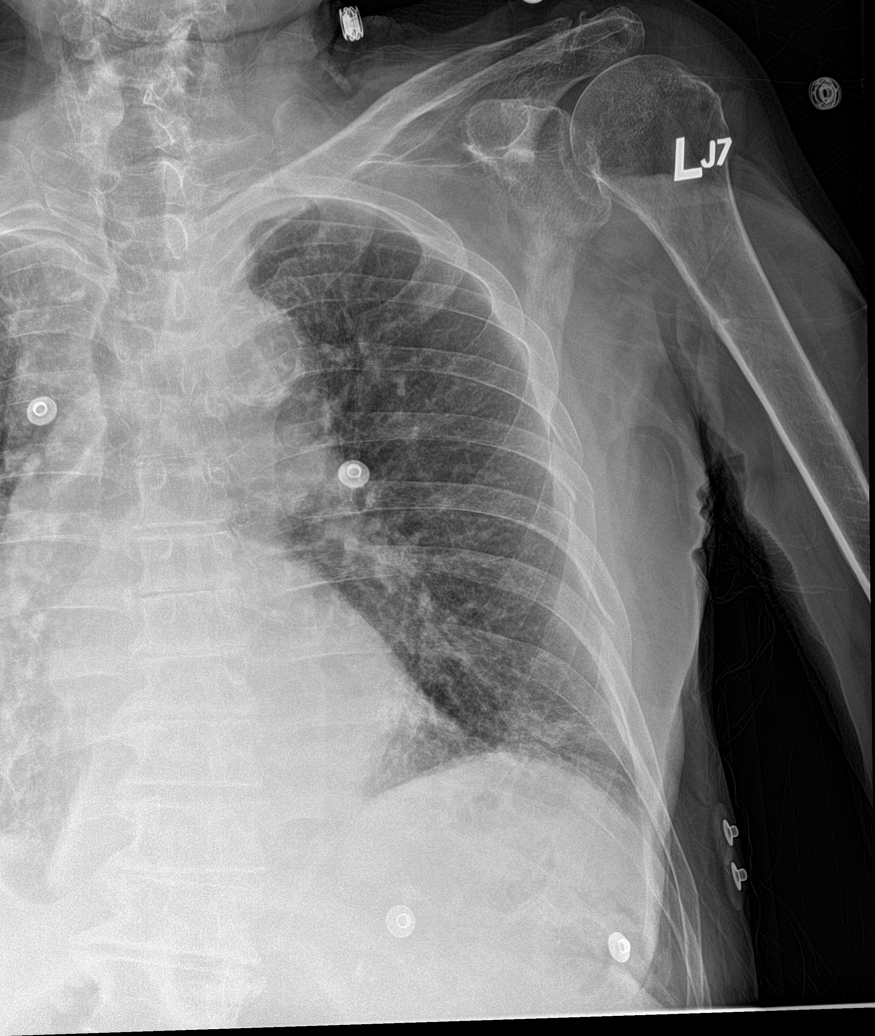

[rib ap obl (1 of 2)]
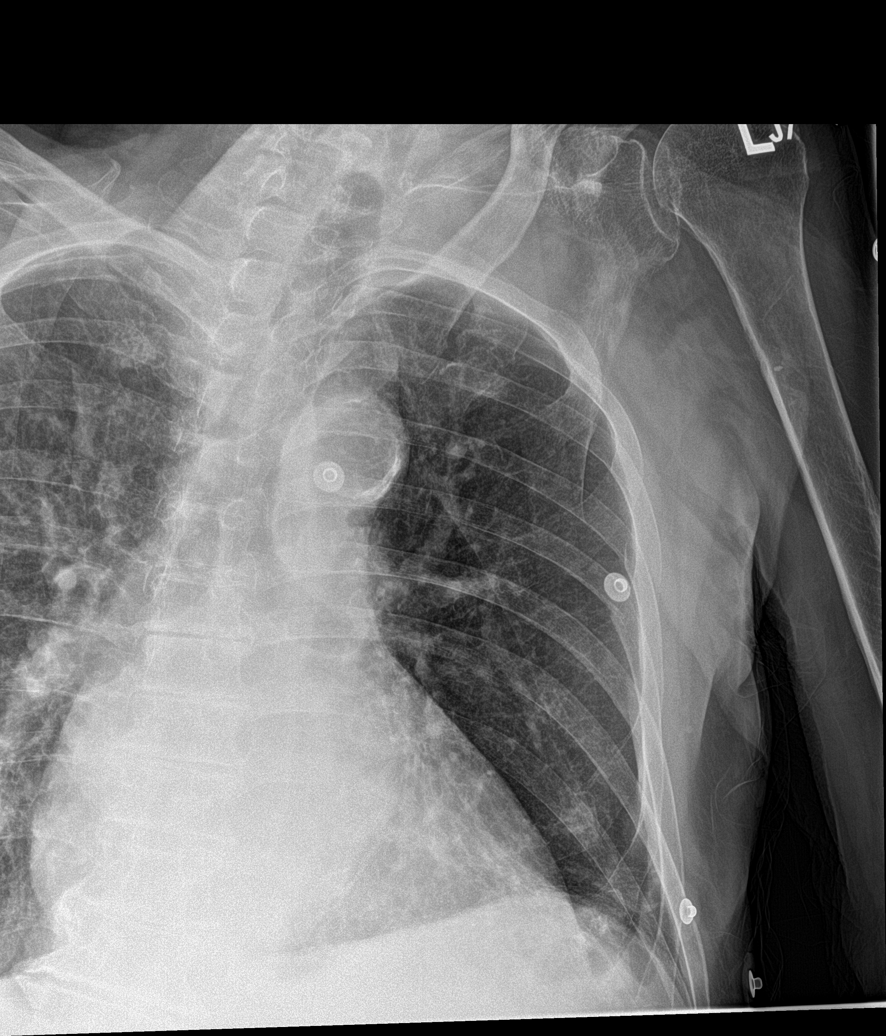

[rib ap obl (2 of 2)]
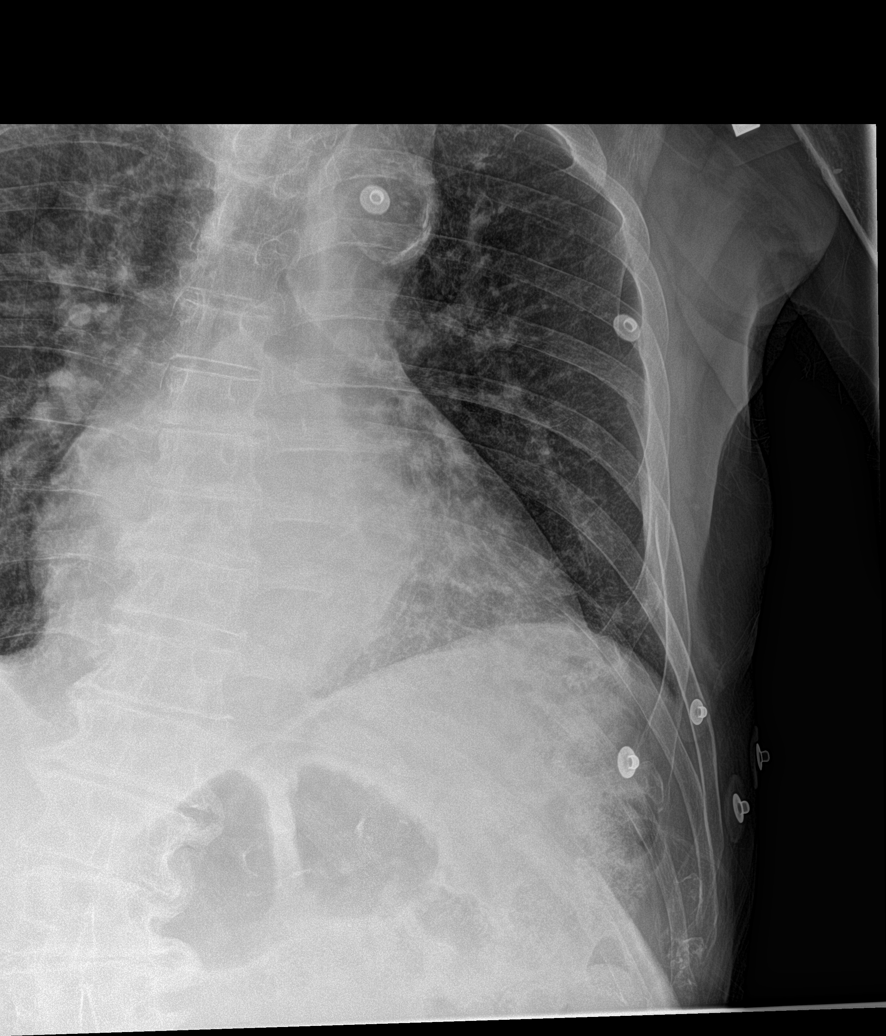

[5 of 5 positions shown; findings below may reference images not displayed]

FINDINGS: Subsegmental atelectasis is seen in the left lung base. The lungs
are otherwise clear. There is no pneumothorax or pleural effusion.
Acute, nondisplaced fractures are seen in the left fifth through
seventh ribs. No other fracture is identified.
IMPRESSION: Acute left fifth through seventh rib fractures. Negative for
pneumothorax.

Cardiomegaly without edema.

Subsegmental atelectasis left lung base.

## 2015-03-09 MED ORDER — CEPHALEXIN 500 MG PO CAPS: 500.0000 mg | ORAL_CAPSULE | Freq: Three times a day (TID) | ORAL | Status: DC

## 2015-03-09 MED ORDER — SODIUM CHLORIDE 0.9 % IV BOLUS (SEPSIS)
500.0000 mL | Freq: Once | INTRAVENOUS | Status: AC
  Administered 2015-03-09: 500 mL via INTRAVENOUS

## 2015-03-09 MED ORDER — DEXTROSE 5 % IV SOLN
1.0000 g | Freq: Once | INTRAVENOUS | Status: AC
  Administered 2015-03-09: 1 g via INTRAVENOUS
  Filled 2015-03-09: qty 10

## 2015-03-09 NOTE — ED Notes (Signed)
Per EMS pt had dizziness and weakness this morning and fell. Pt hit head on bed frame. Pt c/o of head pain, left wrist and rib pain. Pt noted to have hematoma on R forehead. Pt is on coumadin for a fib. Pt A&O. Denies LOC.

## 2015-03-09 NOTE — ED Notes (Signed)
Patient transported to CT 

## 2015-03-09 NOTE — Discharge Instructions (Signed)

## 2015-03-09 NOTE — ED Provider Notes (Signed)
CSN: [id number]     Arrival date & time 03/09/15  0920 History   First MD Initiated Contact with Patient 03/09/15 [phone]     Chief Complaint  Patient presents with  . Fall     (Consider location/radiation/quality/duration/timing/severity/associated sxs/prior Treatment) HPI [name] Is an 81 year old male with a past medical history of atrial fibrillation, rheumatoid arthritis. He has a remote history of vertigo. Patient is on long-term Coumadin use. 4. Atrial fibrillation. Patient presents emergency Department with chief complaint of fall. Patient states that this morning he got up to use the bathroom. As he was walking back to his bed, he suddenly became very dizzy and lost his balance. He fell and hit the right side of his head against the side of the bed and also hit his left side on the floor. He is complaint of hematoma on the right scalp, but denies headache or neck pain. The patient also has complaint of left-sided foot pain which she describes as intermittent. He denies wrist pain. The patient denies melena, hematochezia, feelings of presyncope, racing or skipping in his heart. Patient denies any other focal neurologic deficits except for the transient dizziness. He normally uses a cane and is steady on his feet. He states that the symptoms were different from previous vertigo. Denies fevers, chills, myalgias, arthralgias. Denies DOE, SOB, chest tightness or pressure, radiation to left arm, jaw or back, or diaphoresis. Denies dysuria, flank pain, suprapubic pain, frequency, urgency, or hematuria. Denies headaches,weakness, visual disturbances. Denies abdominal pain, nausea, vomiting, diarrhea or constipation.   Past Medical History  Diagnosis Date  . A-fib   . RA (rheumatoid arthritis)    Past Surgical History  Procedure Laterality Date  . Cataract extraction     History reviewed. No pertinent family history. History  Substance Use Topics  . Smoking status: Light Tobacco  Smoker    Types: Cigars  . Smokeless tobacco: Never Used  . Alcohol Use: No     Comment: April 21, 2005 last drink    Review of Systems  Ten systems reviewed and are negative for acute change, except as noted in the HPI.    Allergies  Shellfish allergy and Sulfa antibiotics  Home Medications   Prior to Admission medications   Medication Sig Start Date End Date Taking? Authorizing Provider  diltiazem (TIAZAC) 360 MG 24 hr capsule Take 360 mg by mouth daily. 02/20/15  Yes Historical Provider, MD  metoprolol succinate (TOPROL-XL) 50 MG 24 hr tablet Take 50 mg by mouth 2 (two) times daily. 02/12/15  Yes Historical Provider, MD  Multiple Vitamins-Minerals (PRESERVISION AREDS) CAPS Take 1 capsule by mouth 2 (two) times daily.   Yes Historical Provider, MD  Omega-3 Fatty Acids (FISH OIL) 1000 MG CAPS Take 2,000 capsules by mouth daily.   Yes Historical Provider, MD  warfarin (COUMADIN) 2 MG tablet Take 2-4 mg by mouth See admin instructions. 2mg  everyday except for thursdays - takes 4mg  on thursdays 02/08/15  Yes Historical Provider, MD   BP 138/77 mmHg  Temp(Src) 98 F (36.7 C) (Oral)  Resp 18  SpO2 96% Physical Exam  Constitutional: He appears well-developed and well-nourished. No distress.  HENT:  Head: Normocephalic and atraumatic.  Eyes: Conjunctivae are normal. No scleral icterus.  Neck: Normal range of motion. Neck supple.  Cardiovascular: Normal rate, regular rhythm and normal heart sounds.   Pulmonary/Chest: Effort normal and breath sounds normal. No respiratory distress.  Abdominal: Soft. There is no tenderness.  Musculoskeletal: He exhibits no edema.  Neurological: He is alert.  Speech is clear and goal oriented, follows commands Major Cranial nerves without deficit, no facial droop Normal strength in upper and lower extremities bilaterally including dorsiflexion and plantar flexion, strong and equal grip strength Sensation normal to light and sharp touch Moves extremities  without ataxia, coordination intact Normal finger to nose and rapid alternating movements Gait is normal for the patient   Skin: Skin is warm and dry. He is not diaphoretic.  Psychiatric: His behavior is normal.  Nursing note and vitals reviewed.   ED Course  Procedures (including critical care time) Labs Review Labs Reviewed  PROTIME-INR  BASIC METABOLIC PANEL  CBC WITH DIFFERENTIAL/PLATELET  URINALYSIS, ROUTINE W REFLEX MICROSCOPIC  OCCULT BLOOD X 1 CARD TO LAB, STOOL  I-STAT CHEM 8, ED    Imaging Review No results found.   EKG Interpretation   Date/Time:  Wednesday March 09 2015 09:30:27 EDT Ventricular Rate:  65 PR Interval:    QRS Duration: 103 QT Interval:  413 QTC Calculation: 429 R Axis:   -45 Text Interpretation:  Atrial flutter Left axis deviation When compared  with ECG of 05/03/2011 No significant change was found Confirmed by  [facility]  MD, Nunzio Cory [id number]) on 03/09/2015 10:14:31 AM      MDM   Final diagnoses:  UTI (lower urinary tract infection)   10:26 AM BP 146/80 mmHg  Pulse 59  Temp(Src) 98 F (36.7 C) (Oral)  Resp 16  SpO2 95%  . Patient with tranisient dizziness and fall. Neuro deficits. Patient is able to stand and ambulate without any changes in his normal gait. Differential includes anemia, TIA, arrhythmia, orthostasis.   12:06 PM BP 146/80 mmHg  Pulse 59  Temp(Src) 98 F (36.7 C) (Oral)  Resp 16  SpO2 95% 1. Urinary tract infection. Urine sent for culture. INR is therapeutic. His elevation in his white blood cell count. Patient does endorse frequency of urination which is new over the past several days. Patient given IV Rocephin. We will discharge with Keflex. He appears safe for discharge at this time      Margarita Mail, PA-C 03/09/15 [place], DO 03/11/15 2148

## 2015-03-11 ENCOUNTER — Emergency Department (HOSPITAL_COMMUNITY): Payer: Medicare Other

## 2015-03-11 ENCOUNTER — Encounter (HOSPITAL_COMMUNITY): Payer: Self-pay | Admitting: Emergency Medicine

## 2015-03-11 ENCOUNTER — Inpatient Hospital Stay (HOSPITAL_COMMUNITY)
Admission: EM | Admit: 2015-03-11 | Discharge: 2015-03-16 | DRG: 378 | Disposition: A | Discharge status: Skilled Nursing Facility | Payer: Medicare Other | Attending: Internal Medicine | Admitting: Internal Medicine

## 2015-03-11 DIAGNOSIS — M069 Rheumatoid arthritis, unspecified: Secondary | ICD-10-CM | POA: Diagnosis present

## 2015-03-11 DIAGNOSIS — R5381 Other malaise: Secondary | ICD-10-CM | POA: Diagnosis not present

## 2015-03-11 DIAGNOSIS — E871 Hypo-osmolality and hyponatremia: Secondary | ICD-10-CM | POA: Diagnosis present

## 2015-03-11 DIAGNOSIS — H409 Unspecified glaucoma: Secondary | ICD-10-CM | POA: Diagnosis not present

## 2015-03-11 DIAGNOSIS — M79604 Pain in right leg: Secondary | ICD-10-CM | POA: Diagnosis not present

## 2015-03-11 DIAGNOSIS — R262 Difficulty in walking, not elsewhere classified: Secondary | ICD-10-CM | POA: Diagnosis present

## 2015-03-11 DIAGNOSIS — E860 Dehydration: Secondary | ICD-10-CM | POA: Diagnosis present

## 2015-03-11 DIAGNOSIS — I10 Essential (primary) hypertension: Secondary | ICD-10-CM | POA: Diagnosis not present

## 2015-03-11 DIAGNOSIS — M25551 Pain in right hip: Secondary | ICD-10-CM | POA: Diagnosis present

## 2015-03-11 DIAGNOSIS — I471 Supraventricular tachycardia: Secondary | ICD-10-CM | POA: Diagnosis not present

## 2015-03-11 DIAGNOSIS — R531 Weakness: Secondary | ICD-10-CM | POA: Diagnosis not present

## 2015-03-11 DIAGNOSIS — D649 Anemia, unspecified: Secondary | ICD-10-CM | POA: Diagnosis present

## 2015-03-11 DIAGNOSIS — F17210 Nicotine dependence, cigarettes, uncomplicated: Secondary | ICD-10-CM | POA: Diagnosis present

## 2015-03-11 DIAGNOSIS — R1084 Generalized abdominal pain: Secondary | ICD-10-CM | POA: Diagnosis not present

## 2015-03-11 DIAGNOSIS — D6489 Other specified anemias: Secondary | ICD-10-CM

## 2015-03-11 DIAGNOSIS — S79911A Unspecified injury of right hip, initial encounter: Secondary | ICD-10-CM | POA: Diagnosis not present

## 2015-03-11 DIAGNOSIS — K921 Melena: Secondary | ICD-10-CM | POA: Diagnosis not present

## 2015-03-11 DIAGNOSIS — N390 Urinary tract infection, site not specified: Secondary | ICD-10-CM | POA: Diagnosis present

## 2015-03-11 DIAGNOSIS — I2510 Atherosclerotic heart disease of native coronary artery without angina pectoris: Secondary | ICD-10-CM | POA: Diagnosis not present

## 2015-03-11 DIAGNOSIS — S0990XA Unspecified injury of head, initial encounter: Secondary | ICD-10-CM | POA: Diagnosis not present

## 2015-03-11 DIAGNOSIS — K5900 Constipation, unspecified: Secondary | ICD-10-CM | POA: Diagnosis not present

## 2015-03-11 DIAGNOSIS — M6281 Muscle weakness (generalized): Secondary | ICD-10-CM | POA: Diagnosis not present

## 2015-03-11 DIAGNOSIS — S098XXA Other specified injuries of head, initial encounter: Secondary | ICD-10-CM | POA: Diagnosis not present

## 2015-03-11 DIAGNOSIS — K222 Esophageal obstruction: Secondary | ICD-10-CM | POA: Diagnosis present

## 2015-03-11 DIAGNOSIS — Z7901 Long term (current) use of anticoagulants: Secondary | ICD-10-CM | POA: Diagnosis not present

## 2015-03-11 DIAGNOSIS — W1830XA Fall on same level, unspecified, initial encounter: Secondary | ICD-10-CM | POA: Diagnosis present

## 2015-03-11 DIAGNOSIS — K920 Hematemesis: Secondary | ICD-10-CM | POA: Diagnosis present

## 2015-03-11 DIAGNOSIS — Z9849 Cataract extraction status, unspecified eye: Secondary | ICD-10-CM | POA: Diagnosis not present

## 2015-03-11 DIAGNOSIS — D500 Iron deficiency anemia secondary to blood loss (chronic): Secondary | ICD-10-CM | POA: Diagnosis present

## 2015-03-11 DIAGNOSIS — I482 Chronic atrial fibrillation: Secondary | ICD-10-CM | POA: Diagnosis not present

## 2015-03-11 DIAGNOSIS — T45515A Adverse effect of anticoagulants, initial encounter: Secondary | ICD-10-CM

## 2015-03-11 DIAGNOSIS — E222 Syndrome of inappropriate secretion of antidiuretic hormone: Secondary | ICD-10-CM | POA: Diagnosis present

## 2015-03-11 DIAGNOSIS — K30 Functional dyspepsia: Secondary | ICD-10-CM | POA: Diagnosis not present

## 2015-03-11 DIAGNOSIS — E861 Hypovolemia: Secondary | ICD-10-CM | POA: Diagnosis not present

## 2015-03-11 DIAGNOSIS — Z882 Allergy status to sulfonamides status: Secondary | ICD-10-CM | POA: Diagnosis not present

## 2015-03-11 DIAGNOSIS — K922 Gastrointestinal hemorrhage, unspecified: Secondary | ICD-10-CM | POA: Diagnosis not present

## 2015-03-11 DIAGNOSIS — K208 Other esophagitis: Secondary | ICD-10-CM | POA: Diagnosis present

## 2015-03-11 DIAGNOSIS — Z8719 Personal history of other diseases of the digestive system: Secondary | ICD-10-CM | POA: Diagnosis not present

## 2015-03-11 DIAGNOSIS — S22028B Other fracture of second thoracic vertebra, initial encounter for open fracture: Secondary | ICD-10-CM | POA: Diagnosis not present

## 2015-03-11 DIAGNOSIS — I4891 Unspecified atrial fibrillation: Secondary | ICD-10-CM | POA: Diagnosis present

## 2015-03-11 DIAGNOSIS — S2232XS Fracture of one rib, left side, sequela: Secondary | ICD-10-CM | POA: Diagnosis not present

## 2015-03-11 DIAGNOSIS — K219 Gastro-esophageal reflux disease without esophagitis: Secondary | ICD-10-CM | POA: Diagnosis not present

## 2015-03-11 DIAGNOSIS — Z66 Do not resuscitate: Secondary | ICD-10-CM | POA: Diagnosis present

## 2015-03-11 DIAGNOSIS — D699 Hemorrhagic condition, unspecified: Secondary | ICD-10-CM | POA: Diagnosis not present

## 2015-03-11 DIAGNOSIS — S0003XA Contusion of scalp, initial encounter: Secondary | ICD-10-CM | POA: Diagnosis present

## 2015-03-11 DIAGNOSIS — S2232XA Fracture of one rib, left side, initial encounter for closed fracture: Secondary | ICD-10-CM | POA: Diagnosis present

## 2015-03-11 DIAGNOSIS — R279 Unspecified lack of coordination: Secondary | ICD-10-CM | POA: Diagnosis not present

## 2015-03-11 DIAGNOSIS — S2242XA Multiple fractures of ribs, left side, initial encounter for closed fracture: Secondary | ICD-10-CM | POA: Diagnosis present

## 2015-03-11 DIAGNOSIS — I4892 Unspecified atrial flutter: Secondary | ICD-10-CM | POA: Diagnosis present

## 2015-03-11 DIAGNOSIS — Z91013 Allergy to seafood: Secondary | ICD-10-CM | POA: Diagnosis not present

## 2015-03-11 DIAGNOSIS — D6832 Hemorrhagic disorder due to extrinsic circulating anticoagulants: Secondary | ICD-10-CM | POA: Diagnosis present

## 2015-03-11 DIAGNOSIS — K2211 Ulcer of esophagus with bleeding: Secondary | ICD-10-CM | POA: Diagnosis not present

## 2015-03-11 DIAGNOSIS — S0083XA Contusion of other part of head, initial encounter: Secondary | ICD-10-CM | POA: Diagnosis not present

## 2015-03-11 DIAGNOSIS — R52 Pain, unspecified: Secondary | ICD-10-CM

## 2015-03-11 DIAGNOSIS — R1314 Dysphagia, pharyngoesophageal phase: Secondary | ICD-10-CM | POA: Diagnosis not present

## 2015-03-11 LAB — CBC WITH DIFFERENTIAL/PLATELET
Basophils Absolute: 0 10*3/uL (ref 0.0–0.1)
Basophils Relative: 0 % (ref 0–1)
EOS ABS: 0 10*3/uL (ref 0.0–0.7)
Eosinophils Relative: 0 % (ref 0–5)
HEMATOCRIT: 31.7 % — AB (ref 39.0–52.0)
HEMOGLOBIN: 11.6 g/dL — AB (ref 13.0–17.0)
Lymphocytes Relative: 3 % — ABNORMAL LOW (ref 12–46)
Lymphs Abs: 0.6 10*3/uL — ABNORMAL LOW (ref 0.7–4.0)
MCH: 33.8 pg (ref 26.0–34.0)
MCHC: 36.6 g/dL — AB (ref 30.0–36.0)
MCV: 92.4 fL (ref 78.0–100.0)
MONOS PCT: 8 % (ref 3–12)
Monocytes Absolute: 1.9 10*3/uL — ABNORMAL HIGH (ref 0.1–1.0)
NEUTROS ABS: 21.2 10*3/uL — AB (ref 1.7–7.7)
Neutrophils Relative %: 89 % — ABNORMAL HIGH (ref 43–77)
PLATELETS: 188 10*3/uL (ref 150–400)
RBC: 3.43 MIL/uL — ABNORMAL LOW (ref 4.22–5.81)
RDW: 13.5 % (ref 11.5–15.5)
WBC: 23.8 10*3/uL — AB (ref 4.0–10.5)

## 2015-03-11 LAB — BASIC METABOLIC PANEL
ANION GAP: 9 (ref 5–15)
BUN: 15 mg/dL (ref 6–23)
CALCIUM: 8.4 mg/dL (ref 8.4–10.5)
CO2: 20 mmol/L (ref 19–32)
Chloride: 84 mmol/L — ABNORMAL LOW (ref 96–112)
Creatinine, Ser: 0.91 mg/dL (ref 0.50–1.35)
GFR calc Af Amer: 85 mL/min — ABNORMAL LOW (ref >90)
GFR, EST NON AFRICAN AMERICAN: 73 mL/min — AB (ref >90)
Glucose, Bld: 137 mg/dL — ABNORMAL HIGH (ref 70–99)
POTASSIUM: 4.3 mmol/L (ref 3.5–5.1)
Sodium: 113 mmol/L — CL (ref 135–145)

## 2015-03-11 LAB — PROTIME-INR
INR: 2.95 — AB (ref 0.00–1.49)
Prothrombin Time: 31 seconds — ABNORMAL HIGH (ref 11.6–15.2)

## 2015-03-11 LAB — I-STAT CG4 LACTIC ACID, ED: LACTIC ACID, VENOUS: 0.84 mmol/L (ref 0.5–2.0)

## 2015-03-11 IMAGING — CR DG HIP (WITH OR WITHOUT PELVIS) 2-3V*R*
3 series · 3 of 3 positions shown · non-contrast
Comparison: None.

CLINICAL DATA: Status post fall. Hit head on bed frame. Right hip
pain and weakness. Initial encounter.

EXAM:
RIGHT HIP (WITH PELVIS) 2-3 VIEWS

[t pelvis ap]
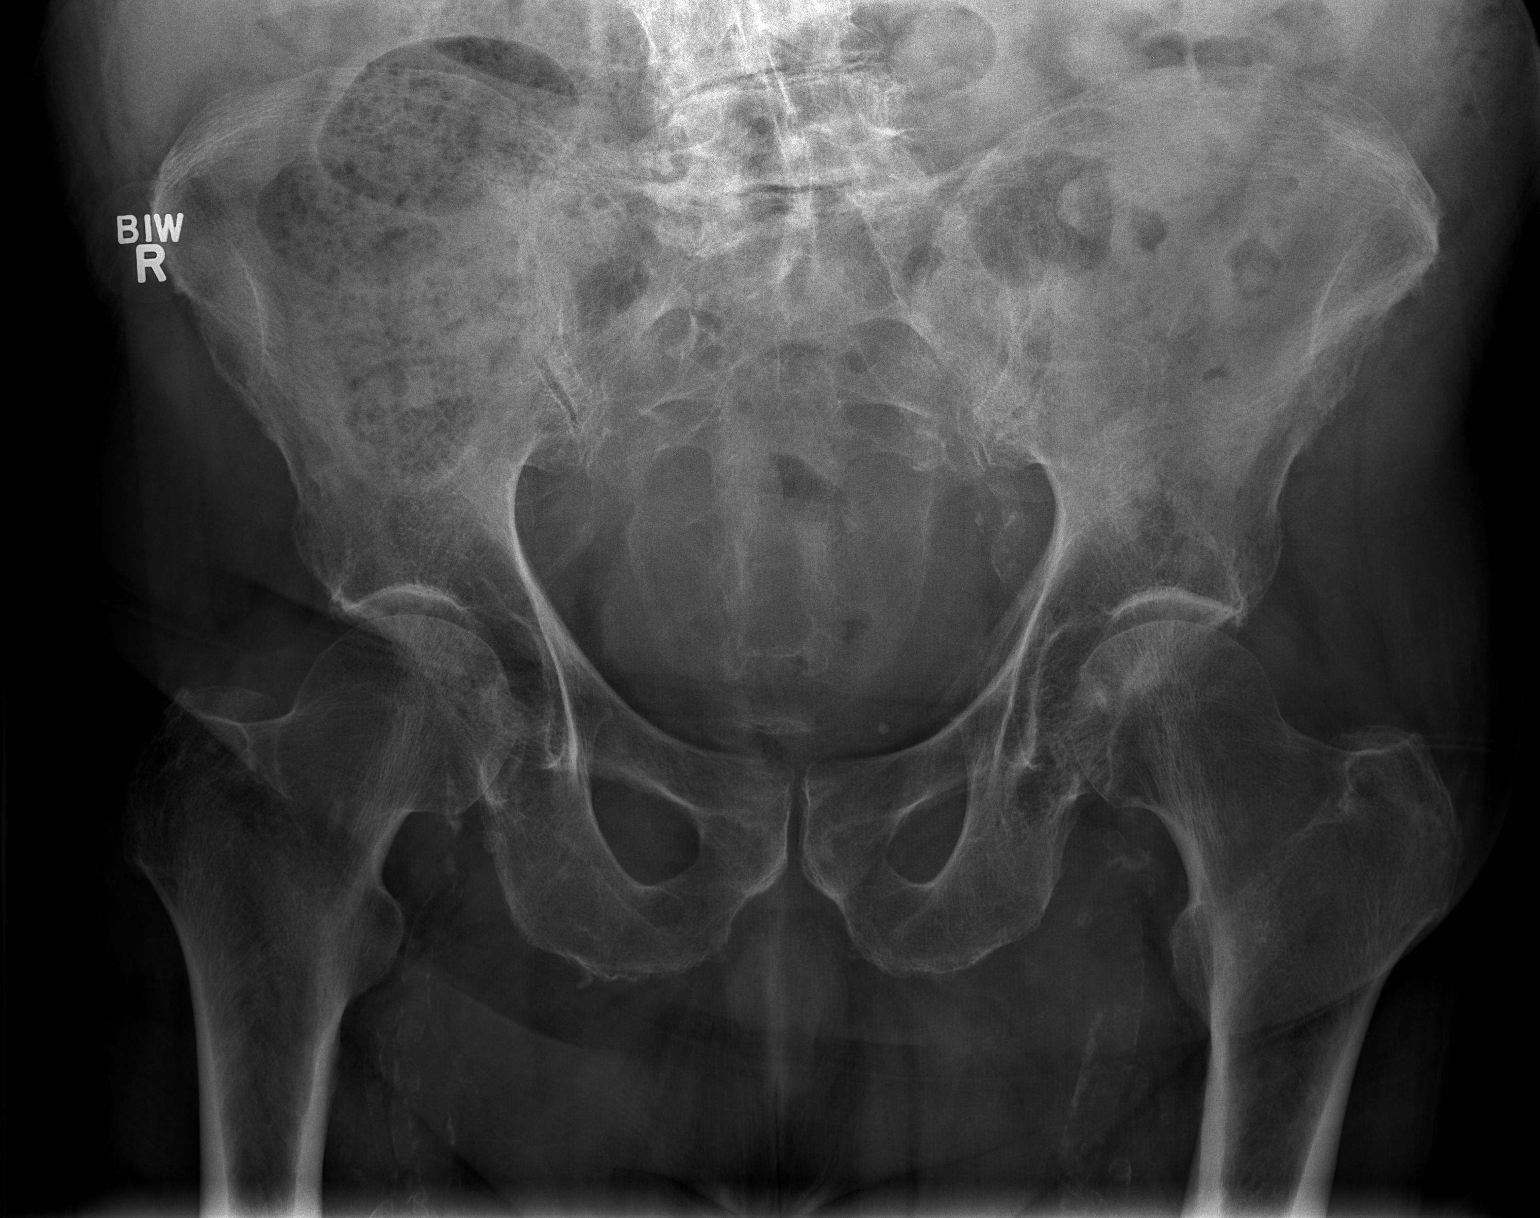

[t hip ap right]
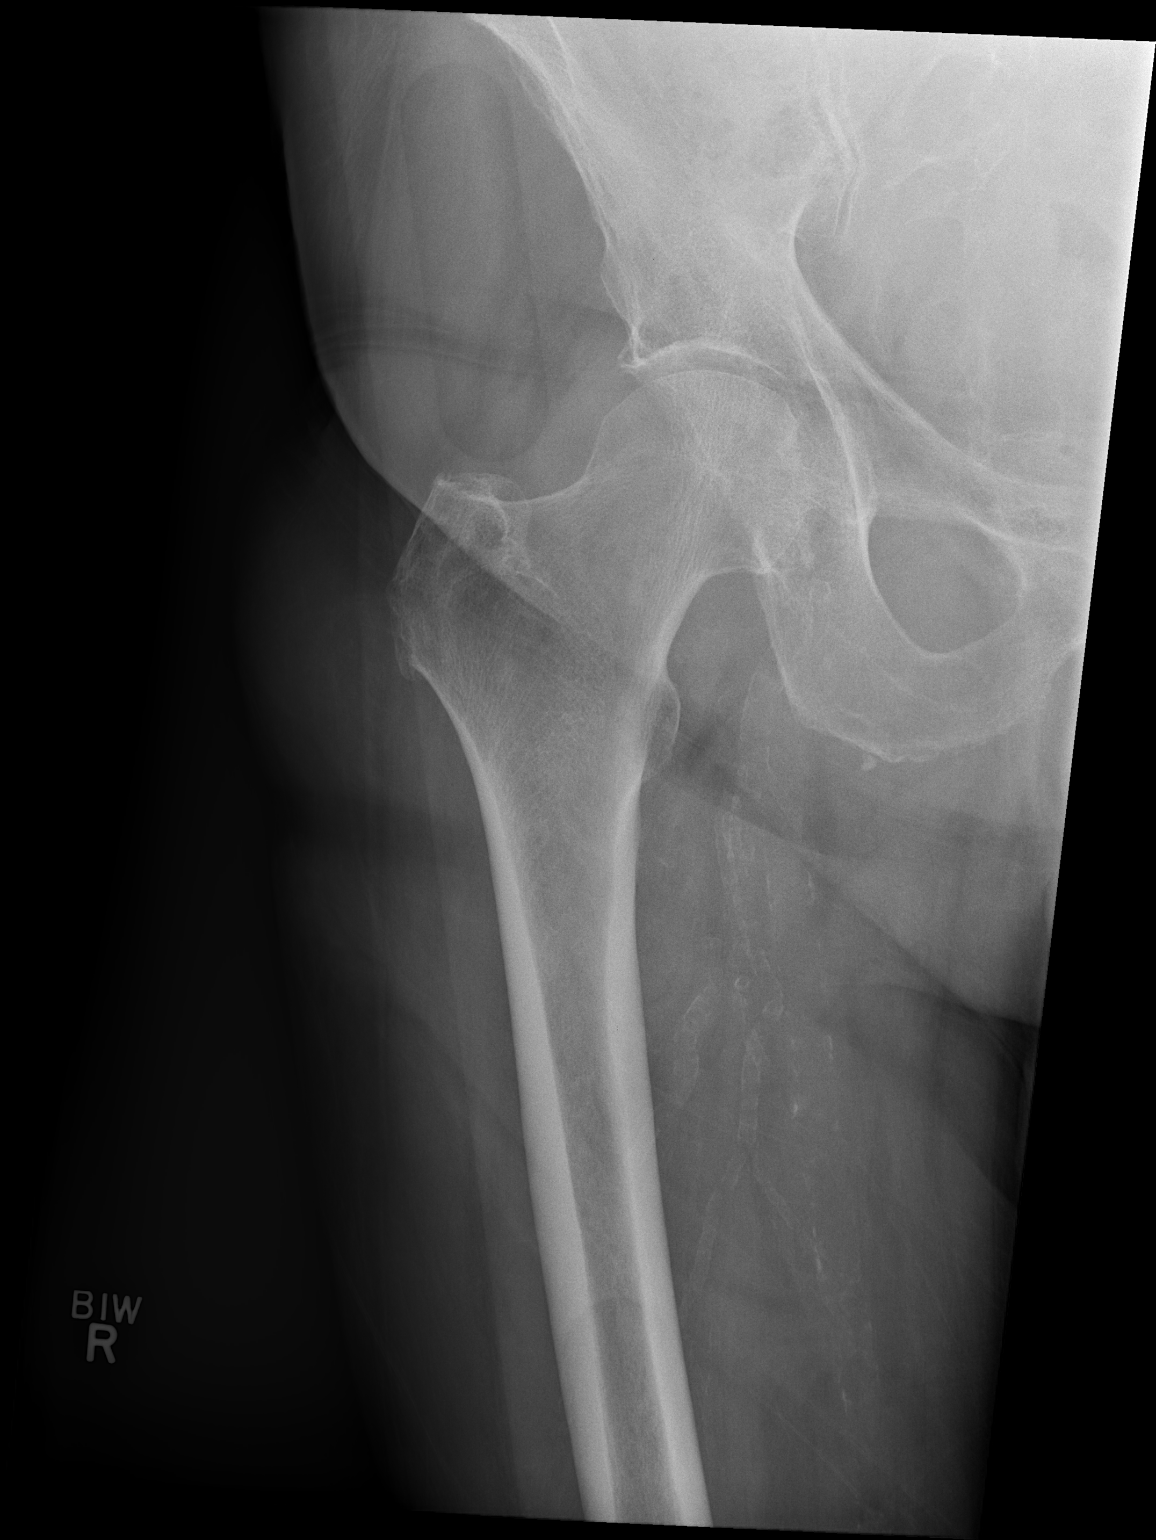

[t hip frog leg right]
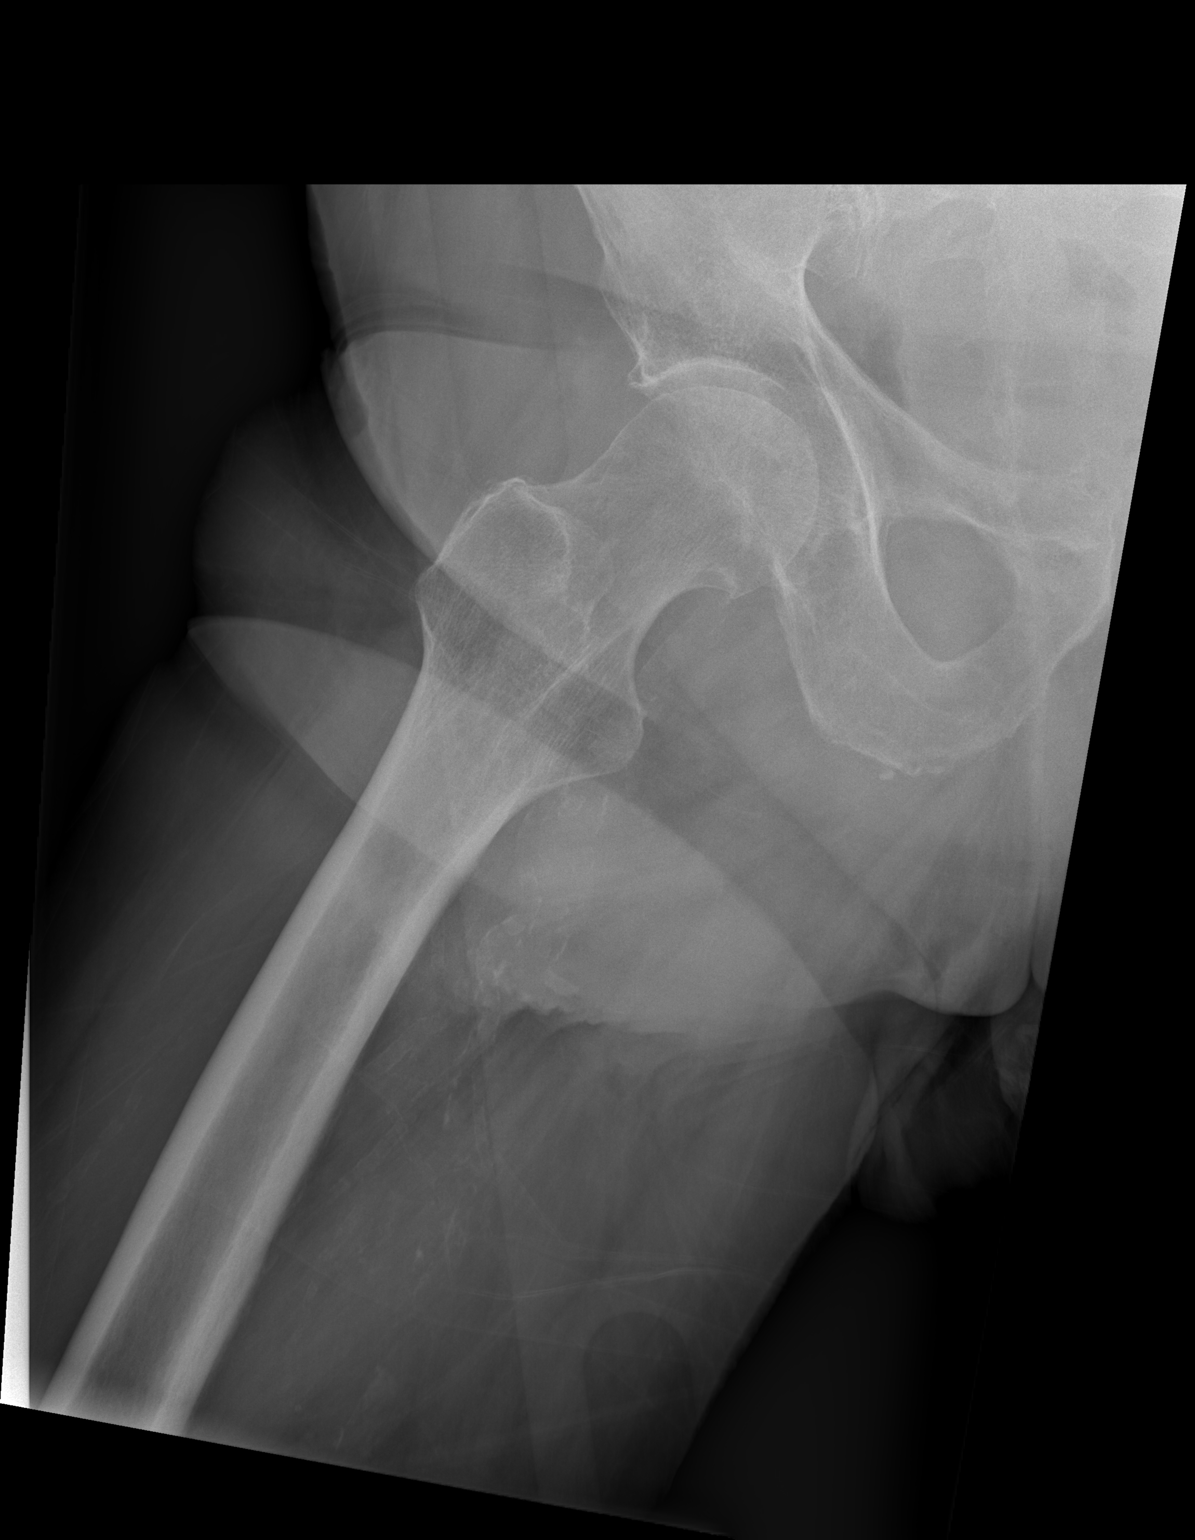

[3 of 3 positions shown; findings below may reference images not displayed]

FINDINGS: There is no evidence of fracture or dislocation. Both femoral heads
are seated normally within their respective acetabula. The proximal
right femur appears intact. Degenerative change is noted at the
lower lumbar spine. The sacroiliac joints are unremarkable in
appearance.

The visualized bowel gas pattern is grossly unremarkable in
appearance. Diffuse vascular calcifications are seen.
IMPRESSION: 1. No evidence of fracture or dislocation.
2. Degenerative change at the lower lumbar spine.
3. Diffuse vascular calcifications seen.

## 2015-03-11 MED ORDER — SODIUM CHLORIDE 0.9 % IV SOLN
Freq: Once | INTRAVENOUS | Status: AC
  Administered 2015-03-11: 22:00:00 via INTRAVENOUS

## 2015-03-11 MED ORDER — ONDANSETRON HCL 4 MG PO TABS: 4.0000 mg | ORAL_TABLET | Freq: Four times a day (QID) | ORAL | Status: DC | PRN

## 2015-03-11 MED ORDER — SODIUM CHLORIDE 0.9 % IV BOLUS (SEPSIS)
500.0000 mL | Freq: Once | INTRAVENOUS | Status: AC
  Administered 2015-03-11: 500 mL via INTRAVENOUS

## 2015-03-11 MED ORDER — MORPHINE SULFATE 4 MG/ML IJ SOLN
4.0000 mg | Freq: Once | INTRAMUSCULAR | Status: AC
  Administered 2015-03-11: 4 mg via INTRAVENOUS
  Filled 2015-03-11: qty 1

## 2015-03-11 MED ORDER — DEXTROSE 5 % IV SOLN
1.0000 g | Freq: Once | INTRAVENOUS | Status: AC
  Administered 2015-03-11: 1 g via INTRAVENOUS
  Filled 2015-03-11: qty 10

## 2015-03-11 MED ORDER — ONDANSETRON HCL 4 MG/2ML IJ SOLN
4.0000 mg | Freq: Four times a day (QID) | INTRAMUSCULAR | Status: DC | PRN
  Administered 2015-03-12 (×2): 4 mg via INTRAVENOUS
  Filled 2015-03-11 (×2): qty 2

## 2015-03-11 MED ORDER — DEXTROSE 5 % IV SOLN
1.0000 g | INTRAVENOUS | Status: DC
  Administered 2015-03-12 – 2015-03-15 (×4): 1 g via INTRAVENOUS
  Filled 2015-03-11 (×5): qty 10

## 2015-03-11 MED ORDER — ALUM & MAG HYDROXIDE-SIMETH 200-200-20 MG/5ML PO SUSP
30.0000 mL | Freq: Four times a day (QID) | ORAL | Status: DC | PRN
  Administered 2015-03-15: 30 mL via ORAL
  Filled 2015-03-11 (×2): qty 30

## 2015-03-11 NOTE — H&P (Signed)
[facility] Admission History and Physical       [name] GHW:[id number] DOB: [birth year] DOA: 03/11/2015  Referring physician: EDP PCP: Thomas Seashore, MD  Specialists:   Chief Complaint:  Weakness  HPI: [name] is a 81 y.o. male with a history of A.Fib on Coumadin Rx who presents to the ED with complaints of increased weakness in both legs and being unable to walk.   He also has increased pain in his right hip since a fall 2 days ago, he was evaluated in the ED and had x-rays which were negative and a ct scan of the head which was also negative for acute findings except for a right frontal scalp hematoma.    He was evaluated in the ED and was found to have hyponatremia, he denies any nausea or vomiting or diarrhea, and he is not on any diuretic rx.    He also denies any chest pain.       Patient had a episode of hematemesis of 200 cc of coffee Ground emesis, and he was re-evaluated, and he denies any ABD pain.  He was upgraded to [facility], and placed on a IV Protonix Drip and his coumadin is being reversed.       Review of Systems:  Constitutional: No Weight Loss, No Weight Gain, Night Sweats, Fevers, Chills, +Dizziness, Light Headedness, Fatigue, +Generalized Weakness HEENT: No Headaches, Difficulty Swallowing,Tooth/Dental Problems,Sore Throat,  No Sneezing, Rhinitis, Ear Ache, Nasal Congestion, or Post Nasal Drip,  Cardio-vascular:  No Chest pain, Orthopnea, PND, Edema in Lower Extremities, Anasarca, Dizziness, Palpitations  Resp: No Dyspnea, No DOE, No Productive Cough, No Non-Productive Cough, No Hemoptysis, No Wheezing.    GI: No Heartburn, Indigestion, Abdominal Pain, Nausea, Vomiting, Diarrhea, Constipation, Hematemesis, Hematochezia, Melena, Change in Bowel Habits,  Loss of Appetite  GU: No Dysuria, No Change in Color of Urine, No Urgency or Urinary Frequency, No Flank pain.  Musculoskeletal: No Joint Pain or Swelling, No Decreased Range of Motion, No Back  Pain.  Neurologic: No Syncope, No Seizures, Muscle Weakness, Paresthesia, Vision Disturbance or Loss, No Diplopia, No Vertigo, No Difficulty Walking,  Skin: No Rash or Lesions. Psych: No Change in Mood or Affect, No Depression or Anxiety, No Memory loss, No Confusion, or Hallucinations   Past Medical History  Diagnosis Date  . A-fib   . RA (rheumatoid arthritis)      Past Surgical History  Procedure Laterality Date  . Cataract extraction        Prior to Admission medications   Medication Sig Start Date End Date Taking? Authorizing Provider  cephALEXin (KEFLEX) 500 MG capsule Take 1 capsule (500 mg total) by mouth 3 (three) times daily. Patient taking differently: Take 500 mg by mouth 3 (three) times daily. Started on 03-09-15 03/09/15  Yes Abigail Harris, PA-C  diltiazem (TIAZAC) 360 MG 24 hr capsule Take 360 mg by mouth daily. 02/20/15  Yes Historical Provider, MD  metoprolol succinate (TOPROL-XL) 50 MG 24 hr tablet Take 50 mg by mouth 2 (two) times daily. 02/12/15  Yes Historical Provider, MD  Multiple Vitamins-Minerals (PRESERVISION AREDS) CAPS Take 1 capsule by mouth 2 (two) times daily.   Yes Historical Provider, MD  Omega-3 Fatty Acids (FISH OIL) 1000 MG CAPS Take 2,000 capsules by mouth daily.   Yes Historical Provider, MD  warfarin (COUMADIN) 2 MG tablet Take 2-4 mg by mouth See admin instructions. 2mg  everyday except for thursdays - takes 4mg  on thursdays 02/08/15  Yes Historical Provider, MD  Allergies  Allergen Reactions  . Shellfish Allergy Anaphylaxis  . Sulfa Antibiotics Anaphylaxis    Social History:  reports that he has been smoking Cigars.  He has never used smokeless tobacco. He reports that he does not drink alcohol or use illicit drugs.    No family history on file.     Physical Exam:  GEN:  Pleasant Elderly Well Nourished and Well Developed  81 y.o. Caucasian male examined and in no acute distress; cooperative with exam Filed Vitals:   03/11/15 2015  03/11/15 2030 03/11/15 2045 03/11/15 2100  BP: 138/80 116/71 124/79 133/88  Pulse: 109 94 90 100  Temp: 98.6 F (37 C)     TempSrc: Oral     SpO2: 95% 94% 95% 96%   Blood pressure 133/88, pulse 100, temperature 98.6 F (37 C), temperature source Oral, SpO2 96 %. PSYCH: He is alert and oriented x4; does not appear anxious does not appear depressed; affect is normal HEENT: Normocephalic and + Right Frontal hematoma, and Periorbital Ecchymosis,  Mucous membranes pink; PERRLA; EOM intact; Fundi:  Benign;  No scleral icterus, Nares: Patent, Oropharynx: Clear, Edentulous with Dentures,    Neck:  FROM, No Cervical Lymphadenopathy nor Thyromegaly or Carotid Bruit; No JVD; Breasts:: Not examined CHEST WALL: No tenderness CHEST: Normal respiration, clear to auscultation bilaterally HEART: Irregular rhythm; no murmurs rubs or gallops BACK: No kyphosis or scoliosis; No CVA tenderness ABDOMEN: Positive Bowel Sounds, Soft Non-Tender, No Rebound or Guarding; No Masses, No Organomegaly,  Rectal Exam: Not done EXTREMITIES: No  Cyanosis, Clubbing, or Edema; No Ulcerations. Genitalia: not examined PULSES: 2+ and symmetric SKIN: Normal hydration no rash or ulceration  CNS:  Alert and Oriented x 4, No Focal Deficits Vascular: pulses palpable throughout    Labs on Admission:  Basic Metabolic Panel:  Recent Labs Lab 03/09/15 1018 03/09/15 1030 03/11/15 2013  NA 133* 135 113*  K 4.4 4.3 4.3  CL 102 101 84*  CO2 24  --  20  GLUCOSE 123* 124* 137*  BUN 18 20 15   CREATININE 1.19 1.10 0.91  CALCIUM 9.1  --  8.4   Liver Function Tests: No results for input(s): AST, ALT, ALKPHOS, BILITOT, PROT, ALBUMIN in the last 168 hours. No results for input(s): LIPASE, AMYLASE in the last 168 hours. No results for input(s): AMMONIA in the last 168 hours. CBC:  Recent Labs Lab 03/09/15 1018 03/09/15 1030 03/11/15 2013  WBC 11.3*  --  23.8*  NEUTROABS 9.3*  --  21.2*  HGB 12.8* 14.6 11.6*  HCT 37.0*  43.0 31.7*  MCV 96.1  --  92.4  PLT 210  --  188   Cardiac Enzymes: No results for input(s): CKTOTAL, CKMB, CKMBINDEX, TROPONINI in the last 168 hours.  BNP (last 3 results) No results for input(s): BNP in the last 8760 hours.  ProBNP (last 3 results) No results for input(s): PROBNP in the last 8760 hours.  CBG: No results for input(s): GLUCAP in the last 168 hours.  Radiological Exams on Admission: Dg Hip Unilat With Pelvis 2-3 Views Right  03/11/2015   CLINICAL DATA:  Status post fall. Hit head on bed frame. Right hip pain and weakness. Initial encounter.  EXAM: RIGHT HIP (WITH PELVIS) 2-3 VIEWS  COMPARISON:  None.  FINDINGS: There is no evidence of fracture or dislocation. Both femoral heads are seated normally within their respective acetabula. The proximal right femur appears intact. Degenerative change is noted at the lower lumbar spine. The sacroiliac joints are  unremarkable in appearance.  The visualized bowel gas pattern is grossly unremarkable in appearance. Diffuse vascular calcifications are seen.  IMPRESSION: 1. No evidence of fracture or dislocation. 2. Degenerative change at the lower lumbar spine. 3. Diffuse vascular calcifications seen.   Electronically Signed   By: Garald Balding M.D.   On: 03/11/2015 21:45     EKG: Independently reviewed. 04/28  Afib/Flutter at 10   Assessment/Plan:   81 y.o. male with   Principal Problem:   1.   Hyponatremia   Cardiac Monitoring   Send Urine Osm and Electrolytes to ev al for SIADH   IVFs with NSS   Monitor Sodium Levels  Active Problems:   2.    GI Bleeding- Consistent with Upper GI Bleed,   IV Protonix Drip   Discontinue Coumadin   Reverse Coumadin, Vitamin K     Type and Screen Sent   2 Units PBCs on hold   GI Consult in AM     3.    Anemia- Due to #2   Send Anemia Panel   Monitor  H/Hs   Transfuse PRN     4.   Weakness- Due to #1   Fall Precautions   May Need PT evaluation     5.   UTI (lower urinary tract  infection)   Urine C+S   IV Rocephin     6.   A-fib   Continue Diltiazem for rate Control   Coumadin discontinued due to #2       7.   Scalp hematoma- from Fall 2 days Ago, had CT scan and X-rays   Pain Control PRN     8.  Warfarin-induced coagulopathy   Reversing Coumadin Due to GI Bleed        9.  DVT Prophylaxis    SCDs     Code Status:   DO NOT RESUSCITATE (DNR)      Family Communication:   Family ( Wife and Son) at Bedside    Disposition Plan:    Inpatient Status        Time spent:  [facility] Pager [phone]   If [place] Please Contact the Day Rounding Team MD for [facility]  If 7PM-7AM, Please Contact Night-Floor Coverage  [email or web address] Password [facility] 03/11/2015, 11:46 PM     ADDENDUM:   Patient was seen and examined on 03/11/2015

## 2015-03-11 NOTE — ED Notes (Signed)
Patient is from. Was seen Two days ago for fall patient now complains of weakness to Rt leg. States pain with movement. Patient denies any pain on arrival. A& O x4 arrivial Negative stroke scale. Able to follow commands. Patient about to move extremity. Patient is ambulatory.

## 2015-03-11 NOTE — ED Provider Notes (Signed)
CSN: [id number]     Arrival date & time 03/11/15  1934 History   First MD Initiated Contact with Patient 03/11/15 1936     Chief Complaint  Patient presents with  . Leg Pain  . Weakness    HPI Patient presents to the emergency room with complaints of right leg pain and weakness. The patient had a fall 2 days ago.  The patient was walking to go to the bathroom the other day when he lost his balance and fell. Patient hit the side of his head on the bed.  He also hit his left side on the floor. The patient was evaluated in the emergency room. His x-rays were negative (including head CT). He was found to have a urinary tract infection. Patient was able to get up and walk and was discharged. Patient states he had been doing well for the last couple of days. He was up and active and had been walking outside. Patient took a nap today and when he woke up he was having pain in his right hip and was not able to get up and stand. He denies any numbness weakness. No nausea or vomiting. Past Medical History  Diagnosis Date  . A-fib   . RA (rheumatoid arthritis)    Past Surgical History  Procedure Laterality Date  . Cataract extraction     No family history on file. History  Substance Use Topics  . Smoking status: Light Tobacco Smoker    Types: Cigars  . Smokeless tobacco: Never Used  . Alcohol Use: No     Comment: April 21, 2005 last drink    Review of Systems  All other systems reviewed and are negative.     Allergies  Shellfish allergy and Sulfa antibiotics  Home Medications   Prior to Admission medications   Medication Sig Start Date End Date Taking? Authorizing Provider  cephALEXin (KEFLEX) 500 MG capsule Take 1 capsule (500 mg total) by mouth 3 (three) times daily. Patient taking differently: Take 500 mg by mouth 3 (three) times daily. Started on 03-09-15 03/09/15  Yes Abigail Harris, PA-C  diltiazem (TIAZAC) 360 MG 24 hr capsule Take 360 mg by mouth daily. 02/20/15  Yes Historical  Provider, MD  metoprolol succinate (TOPROL-XL) 50 MG 24 hr tablet Take 50 mg by mouth 2 (two) times daily. 02/12/15  Yes Historical Provider, MD  Multiple Vitamins-Minerals (PRESERVISION AREDS) CAPS Take 1 capsule by mouth 2 (two) times daily.   Yes Historical Provider, MD  Omega-3 Fatty Acids (FISH OIL) 1000 MG CAPS Take 2,000 capsules by mouth daily.   Yes Historical Provider, MD  warfarin (COUMADIN) 2 MG tablet Take 2-4 mg by mouth See admin instructions. 2mg  everyday except for thursdays - takes 4mg  on thursdays 02/08/15  Yes Historical Provider, MD   BP 133/88 mmHg  Pulse 100  Temp(Src) 98.6 F (37 C) (Oral)  SpO2 96% Physical Exam  Constitutional: No distress.  HENT:  Head: Normocephalic.  Right Ear: External ear normal.  Left Ear: External ear normal.  Bruising and hematoma right side of the head and face  Eyes: Conjunctivae are normal. Right eye exhibits no discharge. Left eye exhibits no discharge. No scleral icterus.  Neck: Neck supple. No tracheal deviation present.  Cardiovascular: Normal rate, regular rhythm and intact distal pulses.   Pulmonary/Chest: Effort normal and breath sounds normal. No stridor. No respiratory distress. He has no wheezes. He has no rales.  Abdominal: Soft. Bowel sounds are normal. He exhibits no distension. There  is no tenderness. There is no rebound and no guarding.  Musculoskeletal: He exhibits no edema.       Right hip: He exhibits tenderness and bony tenderness. He exhibits no swelling.  Neurological: He is alert. He has normal strength. No cranial nerve deficit (no facial droop, extraocular movements intact, no slurred speech) or sensory deficit. He exhibits normal muscle tone. He displays no seizure activity. Coordination normal.  Skin: Skin is warm and dry. No rash noted. He is not diaphoretic.  Psychiatric: He has a normal mood and affect.  Nursing note and vitals reviewed.   ED Course  Procedures (including critical care time) Labs  Review Labs Reviewed  CBC WITH DIFFERENTIAL/PLATELET - Abnormal; Notable for the following:    WBC 23.8 (*)    RBC 3.43 (*)    Hemoglobin 11.6 (*)    HCT 31.7 (*)    MCHC 36.6 (*)    Neutrophils Relative % 89 (*)    Neutro Abs 21.2 (*)    Lymphocytes Relative 3 (*)    Lymphs Abs 0.6 (*)    Monocytes Absolute 1.9 (*)    All other components within normal limits  BASIC METABOLIC PANEL - Abnormal; Notable for the following:    Sodium 113 (*)    Chloride 84 (*)    Glucose, Bld 137 (*)    GFR calc non Af Amer 73 (*)    GFR calc Af Amer 85 (*)    All other components within normal limits  PROTIME-INR - Abnormal; Notable for the following:    Prothrombin Time 31.0 (*)    INR 2.95 (*)    All other components within normal limits  I-STAT CG4 LACTIC ACID, ED    Imaging Review Dg Hip Unilat With Pelvis 2-3 Views Right  03/11/2015   CLINICAL DATA:  Status post fall. Hit head on bed frame. Right hip pain and weakness. Initial encounter.  EXAM: RIGHT HIP (WITH PELVIS) 2-3 VIEWS  COMPARISON:  None.  FINDINGS: There is no evidence of fracture or dislocation. Both femoral heads are seated normally within their respective acetabula. The proximal right femur appears intact. Degenerative change is noted at the lower lumbar spine. The sacroiliac joints are unremarkable in appearance.  The visualized bowel gas pattern is grossly unremarkable in appearance. Diffuse vascular calcifications are seen.  IMPRESSION: 1. No evidence of fracture or dislocation. 2. Degenerative change at the lower lumbar spine. 3. Diffuse vascular calcifications seen.   Electronically Signed   By: Garald Balding M.D.   On: 03/11/2015 21:45    Medications  sodium chloride 0.9 % bolus 500 mL (500 mLs Intravenous New Bag/Given 03/11/15 2021)  morphine 4 MG/ML injection 4 mg (4 mg Intravenous Given 03/11/15 2021)  cefTRIAXone (ROCEPHIN) 1 g in dextrose 5 % 50 mL IVPB (1 g Intravenous New Bag/Given 03/11/15 2149)  0.9 %  sodium  chloride infusion ( Intravenous New Bag/Given 03/11/15 2144)    MDM   Final diagnoses:  Pain  Hyponatremia  UTI (lower urinary tract infection)     The patient has hyponatremia which is new compared to left heart test performed 2 days ago.  Etiology is unclear.  Patient also has leukocytosis. Previously he was diagnosed with a urinary tract infection. Limited culture results suggest staph coag-negative species.  more suggestive of contaminate.  Will continue abx tx.. I will consult the medical service for admission and further treatment.   Dorie Rank, MD 03/12/15 [phone]

## 2015-03-11 NOTE — Progress Notes (Signed)
Report received from [place], [place]. Pt is to be admitted on [place]. Awaiting pt's arrival.

## 2015-03-12 ENCOUNTER — Encounter (HOSPITAL_COMMUNITY)
Admission: EM | Disposition: A | Discharge status: Skilled Nursing Facility | Payer: Self-pay | Source: Home / Self Care | Attending: Internal Medicine

## 2015-03-12 ENCOUNTER — Encounter (HOSPITAL_COMMUNITY): Payer: Self-pay

## 2015-03-12 DIAGNOSIS — K922 Gastrointestinal hemorrhage, unspecified: Principal | ICD-10-CM | POA: Diagnosis present

## 2015-03-12 DIAGNOSIS — D649 Anemia, unspecified: Secondary | ICD-10-CM

## 2015-03-12 DIAGNOSIS — T45515A Adverse effect of anticoagulants, initial encounter: Secondary | ICD-10-CM

## 2015-03-12 DIAGNOSIS — I482 Chronic atrial fibrillation: Secondary | ICD-10-CM

## 2015-03-12 DIAGNOSIS — S0003XA Contusion of scalp, initial encounter: Secondary | ICD-10-CM

## 2015-03-12 DIAGNOSIS — D699 Hemorrhagic condition, unspecified: Secondary | ICD-10-CM

## 2015-03-12 DIAGNOSIS — K920 Hematemesis: Secondary | ICD-10-CM | POA: Diagnosis present

## 2015-03-12 DIAGNOSIS — N390 Urinary tract infection, site not specified: Secondary | ICD-10-CM

## 2015-03-12 DIAGNOSIS — S2232XA Fracture of one rib, left side, initial encounter for closed fracture: Secondary | ICD-10-CM | POA: Diagnosis present

## 2015-03-12 HISTORY — PX: ESOPHAGOGASTRODUODENOSCOPY: SHX5428

## 2015-03-12 LAB — BASIC METABOLIC PANEL
ANION GAP: 10 (ref 5–15)
Anion gap: 10 (ref 5–15)
BUN: 19 mg/dL (ref 6–23)
BUN: 22 mg/dL (ref 6–23)
CHLORIDE: 92 mmol/L — AB (ref 96–112)
CO2: 20 mmol/L (ref 19–32)
CO2: 16 mmol/L — ABNORMAL LOW (ref 19–32)
CREATININE: 0.84 mg/dL (ref 0.50–1.35)
Calcium: 8.2 mg/dL — ABNORMAL LOW (ref 8.4–10.5)
Calcium: 7.8 mg/dL — ABNORMAL LOW (ref 8.4–10.5)
Chloride: 89 mmol/L — ABNORMAL LOW (ref 96–112)
Creatinine, Ser: 0.93 mg/dL (ref 0.50–1.35)
GFR calc non Af Amer: 72 mL/min — ABNORMAL LOW (ref >90)
GFR calc non Af Amer: 75 mL/min — ABNORMAL LOW (ref >90)
GFR, EST AFRICAN AMERICAN: 84 mL/min — AB (ref >90)
GFR, EST AFRICAN AMERICAN: 87 mL/min — AB (ref >90)
Glucose, Bld: 113 mg/dL — ABNORMAL HIGH (ref 70–99)
Glucose, Bld: 93 mg/dL (ref 70–99)
POTASSIUM: 4.5 mmol/L (ref 3.5–5.1)
Potassium: 4.1 mmol/L (ref 3.5–5.1)
Sodium: 119 mmol/L — CL (ref 135–145)
Sodium: 118 mmol/L — CL (ref 135–145)

## 2015-03-12 LAB — HEMOGLOBIN AND HEMATOCRIT, BLOOD
HCT: 30.4 % — ABNORMAL LOW (ref 39.0–52.0)
HCT: 29.4 % — ABNORMAL LOW (ref 39.0–52.0)
HCT: 29.9 % — ABNORMAL LOW (ref 39.0–52.0)
Hemoglobin: 11 g/dL — ABNORMAL LOW (ref 13.0–17.0)
Hemoglobin: 10.7 g/dL — ABNORMAL LOW (ref 13.0–17.0)
Hemoglobin: 10.8 g/dL — ABNORMAL LOW (ref 13.0–17.0)

## 2015-03-12 LAB — CBC
HCT: 29.6 % — ABNORMAL LOW (ref 39.0–52.0)
HEMOGLOBIN: 10.8 g/dL — AB (ref 13.0–17.0)
MCH: 33.4 pg (ref 26.0–34.0)
MCHC: 36.5 g/dL — ABNORMAL HIGH (ref 30.0–36.0)
MCV: 91.6 fL (ref 78.0–100.0)
Platelets: 187 10*3/uL (ref 150–400)
RBC: 3.23 MIL/uL — AB (ref 4.22–5.81)
RDW: 13.5 % (ref 11.5–15.5)
WBC: 19.5 10*3/uL — AB (ref 4.0–10.5)

## 2015-03-12 LAB — NA AND K (SODIUM & POTASSIUM), RAND UR
POTASSIUM UR: 41 mmol/L
SODIUM UR: 17 mmol/L

## 2015-03-12 LAB — URINE CULTURE

## 2015-03-12 LAB — OSMOLALITY, URINE: Osmolality, Ur: 339 mOsm/kg — ABNORMAL LOW (ref 390–1090)

## 2015-03-12 LAB — SODIUM
SODIUM: 121 mmol/L — AB (ref 135–145)
Sodium: 122 mmol/L — ABNORMAL LOW (ref 135–145)

## 2015-03-12 LAB — ABO/RH: ABO/RH(D): O NEG

## 2015-03-12 LAB — MRSA PCR SCREENING: MRSA BY PCR: NEGATIVE

## 2015-03-12 LAB — URIC ACID: Uric Acid, Serum: 3.7 mg/dL — ABNORMAL LOW (ref 4.0–7.8)

## 2015-03-12 LAB — PROTIME-INR
INR: 1.99 — ABNORMAL HIGH (ref 0.00–1.49)
Prothrombin Time: 22.8 seconds — ABNORMAL HIGH (ref 11.6–15.2)

## 2015-03-12 LAB — PREPARE RBC (CROSSMATCH)

## 2015-03-12 LAB — TSH: TSH: 1.242 u[IU]/mL (ref 0.350–4.500)

## 2015-03-12 LAB — OSMOLALITY: OSMOLALITY: 254 mosm/kg — AB (ref 275–300)

## 2015-03-12 SURGERY — EGD (ESOPHAGOGASTRODUODENOSCOPY)
Anesthesia: Moderate Sedation

## 2015-03-12 MED ORDER — PROSIGHT PO TABS
1.0000 | ORAL_TABLET | Freq: Two times a day (BID) | ORAL | Status: DC
  Administered 2015-03-12 – 2015-03-16 (×9): 1 via ORAL
  Filled 2015-03-12 (×12): qty 1

## 2015-03-12 MED ORDER — DILTIAZEM HCL ER BEADS 240 MG PO CP24
360.0000 mg | ORAL_CAPSULE | Freq: Every day | ORAL | Status: DC
  Filled 2015-03-12: qty 1

## 2015-03-12 MED ORDER — FENTANYL CITRATE (PF) 100 MCG/2ML IJ SOLN
INTRAMUSCULAR | Status: DC | PRN
  Administered 2015-03-12 (×2): 25 ug via INTRAVENOUS

## 2015-03-12 MED ORDER — MIDAZOLAM HCL 10 MG/2ML IJ SOLN
INTRAMUSCULAR | Status: DC | PRN
  Administered 2015-03-12 (×2): 2 mg via INTRAVENOUS

## 2015-03-12 MED ORDER — FENTANYL CITRATE (PF) 100 MCG/2ML IJ SOLN
INTRAMUSCULAR | Status: AC
  Filled 2015-03-12: qty 2

## 2015-03-12 MED ORDER — DILTIAZEM HCL ER COATED BEADS 360 MG PO CP24
360.0000 mg | ORAL_CAPSULE | Freq: Every day | ORAL | Status: DC
  Administered 2015-03-12 – 2015-03-16 (×5): 360 mg via ORAL
  Filled 2015-03-12 (×5): qty 1

## 2015-03-12 MED ORDER — METOPROLOL SUCCINATE ER 50 MG PO TB24
50.0000 mg | ORAL_TABLET | Freq: Two times a day (BID) | ORAL | Status: DC
  Administered 2015-03-12 – 2015-03-13 (×4): 50 mg via ORAL
  Filled 2015-03-12 (×6): qty 1

## 2015-03-12 MED ORDER — ACETAMINOPHEN 650 MG RE SUPP
650.0000 mg | Freq: Four times a day (QID) | RECTAL | Status: DC | PRN
Start: 2015-03-12 — End: 2015-03-16

## 2015-03-12 MED ORDER — SODIUM CHLORIDE 0.9 % IV SOLN
80.0000 mg | Freq: Once | INTRAVENOUS | Status: AC
  Administered 2015-03-12: 80 mg via INTRAVENOUS
  Filled 2015-03-12: qty 80

## 2015-03-12 MED ORDER — OXYCODONE HCL 5 MG PO TABS
5.0000 mg | ORAL_TABLET | ORAL | Status: DC | PRN
  Administered 2015-03-14: 5 mg via ORAL
  Filled 2015-03-12 (×2): qty 1

## 2015-03-12 MED ORDER — SODIUM CHLORIDE 0.9 % IV SOLN: Freq: Once | INTRAVENOUS | Status: AC

## 2015-03-12 MED ORDER — SODIUM CHLORIDE 0.9 % IV SOLN
INTRAVENOUS | Status: DC
  Administered 2015-03-12: 05:00:00 via INTRAVENOUS

## 2015-03-12 MED ORDER — ACETAMINOPHEN 325 MG PO TABS
650.0000 mg | ORAL_TABLET | Freq: Four times a day (QID) | ORAL | Status: DC | PRN
  Administered 2015-03-12 – 2015-03-15 (×4): 650 mg via ORAL
  Filled 2015-03-12 (×4): qty 2

## 2015-03-12 MED ORDER — VITAMIN K1 10 MG/ML IJ SOLN
5.0000 mg | Freq: Once | INTRAVENOUS | Status: AC
  Administered 2015-03-12: 5 mg via INTRAVENOUS
  Filled 2015-03-12: qty 0.5

## 2015-03-12 MED ORDER — SODIUM CHLORIDE 0.9 % IJ SOLN
3.0000 mL | Freq: Two times a day (BID) | INTRAMUSCULAR | Status: DC
  Administered 2015-03-12 – 2015-03-16 (×8): 3 mL via INTRAVENOUS

## 2015-03-12 MED ORDER — OMEGA-3-ACID ETHYL ESTERS 1 G PO CAPS
2.0000 g | ORAL_CAPSULE | Freq: Every day | ORAL | Status: DC
  Administered 2015-03-12 – 2015-03-16 (×5): 2 g via ORAL
  Filled 2015-03-12 (×5): qty 2

## 2015-03-12 MED ORDER — SODIUM CHLORIDE 0.9 % IV SOLN
8.0000 mg/h | INTRAVENOUS | Status: DC
  Administered 2015-03-12 – 2015-03-13 (×4): 8 mg/h via INTRAVENOUS
  Filled 2015-03-12 (×14): qty 80

## 2015-03-12 MED ORDER — MIDAZOLAM HCL 5 MG/ML IJ SOLN
INTRAMUSCULAR | Status: AC
  Filled 2015-03-12: qty 2

## 2015-03-12 MED ORDER — HYDROMORPHONE HCL 1 MG/ML IJ SOLN: 0.5000 mg | INTRAMUSCULAR | Status: DC | PRN

## 2015-03-12 MED ORDER — PANTOPRAZOLE SODIUM 40 MG IV SOLR: 40.0000 mg | Freq: Two times a day (BID) | INTRAVENOUS | Status: DC

## 2015-03-12 MED ORDER — BUTAMBEN-TETRACAINE-BENZOCAINE 2-2-14 % EX AERO
INHALATION_SPRAY | CUTANEOUS | Status: DC | PRN
  Administered 2015-03-12: 2 via TOPICAL

## 2015-03-12 NOTE — H&P (View-Only) (Signed)
Referring Provider: Dr. Tana Coast Primary Care Physician:  Merrilee Seashore, MD Primary Gastroenterologist:  Althia Forts  Reason for Consultation:  Hematemesis  HPI: [name] is a 81 y.o. male seen for a consult due to the acute onset of vomiting dark red blood and black colored fluid in the ER while being evaluated for hyponatremia and weakness. He was reportedly close to being discharged home when he vomited up about 200 cc of dark red and coffee grounds emesis. Reports having a lot of belching prior to the vomiting but denies retching or dry heaving. Denies N/V prior to this episode of hematemesis. Denies dizziness, abdominal pain, melena, hematochezia, or diarrhea. He fell earlier this week and has trauma to his face with a right frontal scalp hematoma. On Coumadin for Afib and INR 2.95 that was corrected to 1.99. Denies NSAIDs. Has intermittent dysphagia to food for years. History of acid reflux. Remote history of alcohol use. No known history of ulcers. Wife reports that he had a colonoscopy years ago but results not known and she is unsure if he ever had an EGD. Wife and son present.     Past Medical History  Diagnosis Date  . A-fib   . RA (rheumatoid arthritis)     Past Surgical History  Procedure Laterality Date  . Cataract extraction      Prior to Admission medications   Medication Sig Start Date End Date Taking? Authorizing Provider  cephALEXin (KEFLEX) 500 MG capsule Take 1 capsule (500 mg total) by mouth 3 (three) times daily. Patient taking differently: Take 500 mg by mouth 3 (three) times daily. Started on 03-09-15 03/09/15  Yes Abigail Harris, PA-C  diltiazem (TIAZAC) 360 MG 24 hr capsule Take 360 mg by mouth daily. 02/20/15  Yes Historical Provider, MD  metoprolol succinate (TOPROL-XL) 50 MG 24 hr tablet Take 50 mg by mouth 2 (two) times daily. 02/12/15  Yes Historical Provider, MD  Multiple Vitamins-Minerals (PRESERVISION AREDS) CAPS Take 1 capsule by mouth 2 (two) times  daily.   Yes Historical Provider, MD  Omega-3 Fatty Acids (FISH OIL) 1000 MG CAPS Take 2,000 capsules by mouth daily.   Yes Historical Provider, MD  warfarin (COUMADIN) 2 MG tablet Take 2-4 mg by mouth See admin instructions. 2mg  everyday except for thursdays - takes 4mg  on thursdays 02/08/15  Yes Historical Provider, MD    Scheduled Meds: . cefTRIAXone (ROCEPHIN)  IV  1 g Intravenous Q24H  . diltiazem  360 mg Oral Daily  . metoprolol succinate  50 mg Oral BID  . multivitamin  1 tablet Oral BID  . omega-3 acid ethyl esters  2 g Oral Daily  . [START ON 03/15/2015] pantoprazole (PROTONIX) IV  40 mg Intravenous Q12H  . sodium chloride  3 mL Intravenous Q12H   Continuous Infusions: . sodium chloride 75 mL/hr at 03/12/15 0458  . pantoprozole (PROTONIX) infusion 8 mg/hr (03/12/15 0931)   PRN Meds:.acetaminophen **OR** acetaminophen, alum & mag hydroxide-simeth, HYDROmorphone (DILAUDID) injection, ondansetron **OR** ondansetron (ZOFRAN) IV, oxyCODONE  Allergies as of 03/11/2015 - Review Complete 03/11/2015  Allergen Reaction Noted  . Shellfish allergy Anaphylaxis 03/09/2015  . Sulfa antibiotics Anaphylaxis 03/09/2015    No family history on file.  History   Social History  . Marital Status: Married    Spouse Name: N/A  . Number of Children: N/A  . Years of Education: N/A   Occupational History  . Not on file.   Social History Main Topics  . Smoking status: Light Tobacco Smoker  Types: Cigars  . Smokeless tobacco: Never Used  . Alcohol Use: No     Comment: April 21, 2005 last drink  . Drug Use: No  . Sexual Activity: Not on file   Other Topics Concern  . Not on file   Social History Narrative    Review of Systems: Negative except as stated above in HPI   Physical Exam: Vital signs in last 24 hours: Temp:  [98.2 F (36.8 C)-98.9 F (37.2 C)] 98.9 F (37.2 C) (04/30 0752) Pulse Rate:  [90-115] 115 (04/30 0921) Resp:  [17-23] 19 (04/30 0420) BP: (116-138)/(71-91)  124/74 mmHg (04/30 1046) SpO2:  [94 %-96 %] 96 % (04/30 0420) Weight:  [87.6 kg (193 lb 2 oz)] 87.6 kg (193 lb 2 oz) (04/30 0420)   General:   Lethargic, elderly, well-nourished Head:  Right facial bruise and periorbital bruise Eyes:  Anicteric sclerae  ENT:  No deformity or lesions.  Oropharynx pink & moist. Neck:  Supple; no masses or thyromegaly. Lungs:  Clear throughout to auscultation.   No wheezes, crackles, or rhonchi. No acute distress. Heart:  Regular rate and rhythm; no murmurs, clicks, rubs,  or gallops. Abdomen:  Soft, nontender and nondistended. +BS   Rectal:  Deferred Msk:  Symmetrical without gross deformities. Normal posture. Pulses:  Normal pulses noted. Extremities:  Without clubbing or edema. Neurologic:  Alert and  oriented x4;  grossly normal neurologically. Skin:  Intact without significant lesions or rashes. Psych:  Alert and cooperative. Normal mood and affect.   Lab Results:  Recent Labs  03/11/15 2013 03/12/15 0100 03/12/15 0523 03/12/15 1000  WBC 23.8*  --  19.5*  --   HGB 11.6* 11.0* 10.8* 10.7*  HCT 31.7* 30.4* 29.6* 29.4*  PLT 188  --  187  --    BMET  Recent Labs  03/11/15 2013 03/12/15 0523  NA 113* 119*  K 4.3 4.5  CL 84* 89*  CO2 20 20  GLUCOSE 137* 113*  BUN 15 19  CREATININE 0.91 0.93  CALCIUM 8.4 8.2*    No results for input(s): PROT, ALBUMIN, AST, ALT, ALKPHOS, BILITOT, BILIDIR, IBILI in the last 72 hours. PT/INR  Recent Labs  03/11/15 2013 03/12/15 0523  LABPROT 31.0* 22.8*  INR 2.95* 1.99*     Studies/Results: Dg Hip Unilat With Pelvis 2-3 Views Right  03/11/2015   CLINICAL DATA:  Status post fall. Hit head on bed frame. Right hip pain and weakness. Initial encounter.  EXAM: RIGHT HIP (WITH PELVIS) 2-3 VIEWS  COMPARISON:  None.  FINDINGS: There is no evidence of fracture or dislocation. Both femoral heads are seated normally within their respective acetabula. The proximal right femur appears intact. Degenerative  change is noted at the lower lumbar spine. The sacroiliac joints are unremarkable in appearance.  The visualized bowel gas pattern is grossly unremarkable in appearance. Diffuse vascular calcifications are seen.  IMPRESSION: 1. No evidence of fracture or dislocation. 2. Degenerative change at the lower lumbar spine. 3. Diffuse vascular calcifications seen.   Electronically Signed   By: Garald Balding M.D.   On: 03/11/2015 21:45    Impression/Plan: 81 yo with an episode of mainly coffee grounds emesis concerning for an upper GI bleed such as a Mallory Weiss tear vs peptic ulcer vs esophagitis. Doubt malignancy or AVMs. EGD now. Continue Protonix drip. Supportive care.    LOS: 1 day   [place] C.  03/12/2015, 11:57 AM

## 2015-03-12 NOTE — Interval H&P Note (Signed)
History and Physical Interval Note:  03/12/2015 12:19 PM  [name]  has presented today for surgery, with the diagnosis of GI bleed  The various methods of treatment have been discussed with the patient and family. After consideration of risks, benefits and other options for treatment, the patient has consented to  Procedure(s): ESOPHAGOGASTRODUODENOSCOPY (EGD) (N/A) as a surgical intervention .  The patient's history has been reviewed, patient examined, no change in status, stable for surgery.  I have reviewed the patient's chart and labs.  Questions were answered to the patient's satisfaction.     Letona C.

## 2015-03-12 NOTE — Progress Notes (Addendum)
[facility]                                                                              Patient Demographics  [name], is a 81 y.o. male, DOB - 1922/11/15, PYK:[id number]  Admit date - 03/11/2015   Admitting Physician Theressa Millard, MD   Referring physician: Dr. Thurnell Garbe  Outpatient Primary MD for the patient is Merrilee Seashore, MD   Primary cardiologist: Dr Wynonia Lawman  LOS - 1   Chief Complaint  Patient presents with  . Leg Pain  . Weakness       Brief HPI   Patient is a 81 year old male with A.Fib on Coumadin Rx presented to ED with generalized weakness, and in lower extremities, unable to walk. He was also having increased pain in his right hip since his fall on Wednesday 4/27. He was evaluated in the ED and had x-rays which were negative and a ct scan of the head which was also negative for acute findings except for a right frontal scalp hematoma.He was evaluated in the ED and was found to have hyponatremia, he denied any nausea, vomiting or diarrhea although he has not been eating too well in the last few days. No diuretic therapy on. In the ED, patient had an episode of hematemesis of 200 cc of coffee Ground emesis, and he was re-evaluated, he denied any abdominal pain or melena or hematochezia. He was a admitted to stepdown unit and placed on IV PPI drip. Coumadin was reversed.  Assessment & Plan    Principal Problem:   Hyponatremia: Possibly due to poor by mouth intake in the last few days, however appears to have SIADH, no hx of diuretics. No diarrhea.   - Sodium was 113 at the time of admission, Related serum osmolarity 249, urine osmolarity 339, also has UTI - Urine sodium 17 - As sodium is improving, 119 this morning, for now will continue gentle fluid challenge to compensate for dehydration and poor by mouth intake.   - Addendum: Repeat Na 118, called renal consult, d/w Dr Joelyn Oms, for now will cont IVF. Patient has to be NPO due to  esophageal stricture. Dr Joelyn Oms will see patient for further recommendations.    Active Problems: Anemia with Upper GI bleed - Continue PPI, no further gross bleeding, hemoglobin currently stable, keep nothing by mouth, GI consult obtained, discussed with Dr. Michail Sermon - Obtain a stool occult test  - GI, EGD done: distal erosive esophagitis likely cause of hematemesis, distal benign appearing stricture. Rec'd Ice chips and sips of water only today.     UTI (lower urinary tract infection) - Follow urine culture and sensitivities, continue IV Rocephin    A-fib - Continue Cardizem, rate controlled - Hold Coumadin due to upper GI bleed.   Generalized  Weakness -PTOT evaluation     Left rib fracture- 5th, 6th, 7thSecondary to fall with a scalp hematoma - PTOT evaluation, currently no chest pain   Code Status: DO NOT RESUSCITATE  Family Communication: Discussed in detail with the patient, all imaging results, lab results explained to the patient    Disposition Plan: Remain in stepdown unit today  Time Spent in minutes  25 mins  Procedures  None  Consults   Gastroenterology    DVT Prophylaxis  SCDs   Medications  Scheduled Meds: . cefTRIAXone (ROCEPHIN)  IV  1 g Intravenous Q24H  . diltiazem  360 mg Oral Daily  . metoprolol succinate  50 mg Oral BID  . multivitamin  1 tablet Oral BID  . omega-3 acid ethyl esters  2 g Oral Daily  . [START ON 03/15/2015] pantoprazole (PROTONIX) IV  40 mg Intravenous Q12H  . sodium chloride  3 mL Intravenous Q12H   Continuous Infusions: . sodium chloride 75 mL/hr at 03/12/15 0458  . pantoprozole (PROTONIX) infusion 8 mg/hr (03/12/15 0931)   PRN Meds:.acetaminophen **OR** acetaminophen, alum & mag hydroxide-simeth, HYDROmorphone (DILAUDID) injection, ondansetron **OR** ondansetron (ZOFRAN) IV, oxyCODONE   Antibiotics   Anti-infectives    Start     Dose/Rate Route Frequency Ordered Stop   03/12/15 2115  cefTRIAXone (ROCEPHIN) 1 g in  dextrose 5 % 50 mL IVPB     1 g 100 mL/hr over 30 Minutes Intravenous Every 24 hours 03/11/15 2342     03/11/15 2115  cefTRIAXone (ROCEPHIN) 1 g in dextrose 5 % 50 mL IVPB     1 g 100 mL/hr over 30 Minutes Intravenous  Once 03/11/15 2108 03/11/15 2345        Subjective:   [name] was seen and examined today.  Patient denies dizziness, chest pain, shortness of breath, abdominal pain, N/V/D/C, new weakness, numbess, tingling. No acute events overnight.  States feeling overall better today  Objective:   Blood pressure 120/83, pulse 115, temperature 98.9 F (37.2 C), temperature source Oral, resp. rate 19, height 5\' 10"  (1.778 m), weight 87.6 kg (193 lb 2 oz), SpO2 96 %.  Wt Readings from Last 3 Encounters:  03/12/15 87.6 kg (193 lb 2 oz)     Intake/Output Summary (Last 24 hours) at 03/12/15 1032 Last data filed at 03/12/15 0922  Gross per 24 hour  Intake  115.5 ml  Output    170 ml  Net  -54.5 ml    Exam  General: Alert and oriented x 3, NAD, right frontal hematoma, periorbital ecchymosis   HEENT:  PERRLA, EOMI, Anicteic Sclera, mucous membranes moist.   Neck: Supple, no JVD, no masses  CVS: S1 S2 auscultated, no rubs, murmurs or gallops. Regular rate and rhythm.  Respiratory: Clear to auscultation bilaterally, no wheezing, rales or rhonchi  Abdomen: Soft, nontender, nondistended, + bowel sounds  Ext: no cyanosis clubbing or edema  Neuro: AAOx3, Cr N's II- XII. Strength 5/5 upper and lower extremities bilaterally  Skin: No rashes  Psych: Normal affect and demeanor, alert and oriented x3    Data Review   Micro Results Recent Results (from the past 240 hour(s))  Urine culture     Status: None (Preliminary result)   Collection Time: 03/09/15 10:00 AM  Result Value Ref Range Status   Specimen Description URINE, CLEAN CATCH  Final   Special Requests NONE  Final   Colony Count   Final    >=100,000 COLONIES/ML Performed at [facility]     Culture   Final    STAPHYLOCOCCUS SPECIES (COAGULASE NEGATIVE) Note: RIFAMPIN AND GENTAMICIN SHOULD NOT BE USED AS SINGLE DRUGS FOR TREATMENT OF STAPH INFECTIONS. Performed at [facility]    Report Status PENDING  Incomplete  MRSA PCR Screening     Status: None   Collection Time: 03/12/15  4:32 AM  Result  Value Ref Range Status   MRSA by PCR NEGATIVE NEGATIVE Final    Comment:        The GeneXpert MRSA Assay (FDA approved for NASAL specimens only), is one component of a comprehensive MRSA colonization surveillance program. It is not intended to diagnose MRSA infection nor to guide or monitor treatment for MRSA infections.     Radiology Reports Dg Ribs Unilateral W/chest Left  03/09/2015   CLINICAL DATA:  Status post fall today.  Anterior left rib pain.  EXAM: LEFT RIBS AND CHEST - 3+ VIEW  COMPARISON:  Single view of the chest 05/03/2011. CT chest 05/04/2011.  FINDINGS: Subsegmental atelectasis is seen in the left lung base. The lungs are otherwise clear. There is no pneumothorax or pleural effusion. Acute, nondisplaced fractures are seen in the left fifth through seventh ribs. No other fracture is identified.  IMPRESSION: Acute left fifth through seventh rib fractures. Negative for pneumothorax.  Cardiomegaly without edema.  Subsegmental atelectasis left lung base.   Electronically Signed   By: Inge Rise M.D.   On: 03/09/2015 11:13   Ct Head Wo Contrast  03/09/2015   CLINICAL DATA:  Patient became dizzy and fell, hitting head on bed frame. Complaining of head and neck pain  EXAM: CT HEAD WITHOUT CONTRAST  CT CERVICAL SPINE WITHOUT CONTRAST  TECHNIQUE: Multidetector CT imaging of the head and cervical spine was performed following the standard protocol without intravenous contrast. Multiplanar CT image reconstructions of the cervical spine were also generated.  COMPARISON:  None.  FINDINGS: CT HEAD FINDINGS  There is moderate diffuse atrophy. There is no intracranial mass,  hemorrhage, extra-axial fluid collection, or midline shift. There is mild small vessel disease in the centra semiovale bilaterally. No acute infarct apparent. Bony calvarium appears intact. The mastoid air cells are clear. There is a right frontal scalp hematoma. There is mild mucosal thickening in the inferior right maxillary antrum.  CT CERVICAL SPINE FINDINGS  There is no apparent fracture. There is 3 mm of anterolisthesis of C2 on C3. There is 5 mm of anterolisthesis of C3 on C4. There is 1 mm of retrolisthesis of C4 on C5. There is 1 mm of retrolisthesis of C5 on C6. There is 2 mm of anterolisthesis of C7 on T1. These areas of spondylolisthesis are felt to be secondary to underlying spondylosis. The prevertebral soft tissues and predental space regions are normal. There is severe disc space narrowing at C4-5, C5-6, and C6-7. There is moderate narrowing at all other levels. There is facet hypertrophy to varying degrees at all levels bilaterally. There is no frank disc extrusion or stenosis. There is exit foraminal narrowing due to bony hypertrophy at multiple levels, most marked at C5-6 on the left.  IMPRESSION: CT head: Atrophy with mild periventricular small vessel disease. No intracranial mass, hemorrhage, or subdural/epidural hematoma. No acute infarct apparent. Right frontal scalp hematoma.  CT cervical spine: Extensive spondylosis. Mild spondylolisthesis at most levels, felt to be due to spondylosis. No fracture apparent.   Electronically Signed   By: Lowella Grip III M.D.   On: 03/09/2015 10:47   Ct Cervical Spine Wo Contrast  03/09/2015   CLINICAL DATA:  Patient became dizzy and fell, hitting head on bed frame. Complaining of head and neck pain  EXAM: CT HEAD WITHOUT CONTRAST  CT CERVICAL SPINE WITHOUT CONTRAST  TECHNIQUE: Multidetector CT imaging of the head and cervical spine was performed following the standard protocol without intravenous contrast. Multiplanar CT image reconstructions of the  cervical spine were also generated.  COMPARISON:  None.  FINDINGS: CT HEAD FINDINGS  There is moderate diffuse atrophy. There is no intracranial mass, hemorrhage, extra-axial fluid collection, or midline shift. There is mild small vessel disease in the centra semiovale bilaterally. No acute infarct apparent. Bony calvarium appears intact. The mastoid air cells are clear. There is a right frontal scalp hematoma. There is mild mucosal thickening in the inferior right maxillary antrum.  CT CERVICAL SPINE FINDINGS  There is no apparent fracture. There is 3 mm of anterolisthesis of C2 on C3. There is 5 mm of anterolisthesis of C3 on C4. There is 1 mm of retrolisthesis of C4 on C5. There is 1 mm of retrolisthesis of C5 on C6. There is 2 mm of anterolisthesis of C7 on T1. These areas of spondylolisthesis are felt to be secondary to underlying spondylosis. The prevertebral soft tissues and predental space regions are normal. There is severe disc space narrowing at C4-5, C5-6, and C6-7. There is moderate narrowing at all other levels. There is facet hypertrophy to varying degrees at all levels bilaterally. There is no frank disc extrusion or stenosis. There is exit foraminal narrowing due to bony hypertrophy at multiple levels, most marked at C5-6 on the left.  IMPRESSION: CT head: Atrophy with mild periventricular small vessel disease. No intracranial mass, hemorrhage, or subdural/epidural hematoma. No acute infarct apparent. Right frontal scalp hematoma.  CT cervical spine: Extensive spondylosis. Mild spondylolisthesis at most levels, felt to be due to spondylosis. No fracture apparent.   Electronically Signed   By: Lowella Grip III M.D.   On: 03/09/2015 10:47   Dg Hip Unilat With Pelvis 2-3 Views Right  03/11/2015   CLINICAL DATA:  Status post fall. Hit head on bed frame. Right hip pain and weakness. Initial encounter.  EXAM: RIGHT HIP (WITH PELVIS) 2-3 VIEWS  COMPARISON:  None.  FINDINGS: There is no evidence of  fracture or dislocation. Both femoral heads are seated normally within their respective acetabula. The proximal right femur appears intact. Degenerative change is noted at the lower lumbar spine. The sacroiliac joints are unremarkable in appearance.  The visualized bowel gas pattern is grossly unremarkable in appearance. Diffuse vascular calcifications are seen.  IMPRESSION: 1. No evidence of fracture or dislocation. 2. Degenerative change at the lower lumbar spine. 3. Diffuse vascular calcifications seen.   Electronically Signed   By: Garald Balding M.D.   On: 03/11/2015 21:45    CBC  Recent Labs Lab 03/09/15 1018 03/09/15 1030 03/11/15 2013 03/12/15 0100 03/12/15 0523  WBC 11.3*  --  23.8*  --  19.5*  HGB 12.8* 14.6 11.6* 11.0* 10.8*  HCT 37.0* 43.0 31.7* 30.4* 29.6*  PLT 210  --  188  --  187  MCV 96.1  --  92.4  --  91.6  MCH 33.2  --  33.8  --  33.4  MCHC 34.6  --  36.6*  --  36.5*  RDW 14.3  --  13.5  --  13.5  LYMPHSABS 0.9  --  0.6*  --   --   MONOABS 1.0  --  1.9*  --   --   EOSABS 0.2  --  0.0  --   --   BASOSABS 0.0  --  0.0  --   --     Chemistries   Recent Labs Lab 03/09/15 1018 03/09/15 1030 03/11/15 2013 03/12/15 0523  NA 133* 135 113* 119*  K 4.4 4.3 4.3 4.5  CL  102 101 84* 89*  CO2 24  --  20 20  GLUCOSE 123* 124* 137* 113*  BUN 18 20 15 19   CREATININE 1.19 1.10 0.91 0.93  CALCIUM 9.1  --  8.4 8.2*   ------------------------------------------------------------------------------------------------------------------ estimated creatinine clearance is 60 mL/min (by C-G formula based on Cr of 0.93). ------------------------------------------------------------------------------------------------------------------ No results for input(s): HGBA1C in the last 72 hours. ------------------------------------------------------------------------------------------------------------------ No results for input(s): CHOL, HDL, LDLCALC, TRIG, CHOLHDL, LDLDIRECT in the last  72 hours. ------------------------------------------------------------------------------------------------------------------ No results for input(s): TSH, T4TOTAL, T3FREE, THYROIDAB in the last 72 hours.  Invalid input(s): FREET3 ------------------------------------------------------------------------------------------------------------------ No results for input(s): VITAMINB12, FOLATE, FERRITIN, TIBC, IRON, RETICCTPCT in the last 72 hours.  Coagulation profile  Recent Labs Lab 03/09/15 1018 03/11/15 2013 03/12/15 0523  INR 2.17* 2.95* 1.99*    No results for input(s): DDIMER in the last 72 hours.  Cardiac Enzymes No results for input(s): CKMB, TROPONINI, MYOGLOBIN in the last 168 hours.  Invalid input(s): CK ------------------------------------------------------------------------------------------------------------------ Invalid input(s): POCBNP  No results for input(s): GLUCAP in the last 72 hours.   RAI,RIPUDEEP M.D. [facility] 03/12/2015, 10:32 AM  Pager: [phone]   Between 7am to 7pm - call Pager - [phone]  After 7pm go to [email or web address] - password [email or web address]  Call night coverage person covering after 7pm

## 2015-03-12 NOTE — Consult Note (Signed)
Referring Provider: Dr. Tana Coast Primary Care Physician:  Merrilee Seashore, MD Primary Gastroenterologist:  Althia Forts  Reason for Consultation:  Hematemesis  HPI: [name] is a 81 y.o. male seen for a consult due to the acute onset of vomiting dark red blood and black colored fluid in the ER while being evaluated for hyponatremia and weakness. He was reportedly close to being discharged home when he vomited up about 200 cc of dark red and coffee grounds emesis. Reports having a lot of belching prior to the vomiting but denies retching or dry heaving. Denies N/V prior to this episode of hematemesis. Denies dizziness, abdominal pain, melena, hematochezia, or diarrhea. He fell earlier this week and has trauma to his face with a right frontal scalp hematoma. On Coumadin for Afib and INR 2.95 that was corrected to 1.99. Denies NSAIDs. Has intermittent dysphagia to food for years. History of acid reflux. Remote history of alcohol use. No known history of ulcers. Wife reports that he had a colonoscopy years ago but results not known and she is unsure if he ever had an EGD. Wife and son present.     Past Medical History  Diagnosis Date  . A-fib   . RA (rheumatoid arthritis)     Past Surgical History  Procedure Laterality Date  . Cataract extraction      Prior to Admission medications   Medication Sig Start Date End Date Taking? Authorizing Provider  cephALEXin (KEFLEX) 500 MG capsule Take 1 capsule (500 mg total) by mouth 3 (three) times daily. Patient taking differently: Take 500 mg by mouth 3 (three) times daily. Started on 03-09-15 03/09/15  Yes Abigail Harris, PA-C  diltiazem (TIAZAC) 360 MG 24 hr capsule Take 360 mg by mouth daily. 02/20/15  Yes Historical Provider, MD  metoprolol succinate (TOPROL-XL) 50 MG 24 hr tablet Take 50 mg by mouth 2 (two) times daily. 02/12/15  Yes Historical Provider, MD  Multiple Vitamins-Minerals (PRESERVISION AREDS) CAPS Take 1 capsule by mouth 2 (two) times  daily.   Yes Historical Provider, MD  Omega-3 Fatty Acids (FISH OIL) 1000 MG CAPS Take 2,000 capsules by mouth daily.   Yes Historical Provider, MD  warfarin (COUMADIN) 2 MG tablet Take 2-4 mg by mouth See admin instructions. 2mg  everyday except for thursdays - takes 4mg  on thursdays 02/08/15  Yes Historical Provider, MD    Scheduled Meds: . cefTRIAXone (ROCEPHIN)  IV  1 g Intravenous Q24H  . diltiazem  360 mg Oral Daily  . metoprolol succinate  50 mg Oral BID  . multivitamin  1 tablet Oral BID  . omega-3 acid ethyl esters  2 g Oral Daily  . [START ON 03/15/2015] pantoprazole (PROTONIX) IV  40 mg Intravenous Q12H  . sodium chloride  3 mL Intravenous Q12H   Continuous Infusions: . sodium chloride 75 mL/hr at 03/12/15 0458  . pantoprozole (PROTONIX) infusion 8 mg/hr (03/12/15 0931)   PRN Meds:.acetaminophen **OR** acetaminophen, alum & mag hydroxide-simeth, HYDROmorphone (DILAUDID) injection, ondansetron **OR** ondansetron (ZOFRAN) IV, oxyCODONE  Allergies as of 03/11/2015 - Review Complete 03/11/2015  Allergen Reaction Noted  . Shellfish allergy Anaphylaxis 03/09/2015  . Sulfa antibiotics Anaphylaxis 03/09/2015    No family history on file.  History   Social History  . Marital Status: Married    Spouse Name: N/A  . Number of Children: N/A  . Years of Education: N/A   Occupational History  . Not on file.   Social History Main Topics  . Smoking status: Light Tobacco Smoker  Types: Cigars  . Smokeless tobacco: Never Used  . Alcohol Use: No     Comment: April 21, 2005 last drink  . Drug Use: No  . Sexual Activity: Not on file   Other Topics Concern  . Not on file   Social History Narrative    Review of Systems: Negative except as stated above in HPI   Physical Exam: Vital signs in last 24 hours: Temp:  [98.2 F (36.8 C)-98.9 F (37.2 C)] 98.9 F (37.2 C) (04/30 0752) Pulse Rate:  [90-115] 115 (04/30 0921) Resp:  [17-23] 19 (04/30 0420) BP: (116-138)/(71-91)  124/74 mmHg (04/30 1046) SpO2:  [94 %-96 %] 96 % (04/30 0420) Weight:  [87.6 kg (193 lb 2 oz)] 87.6 kg (193 lb 2 oz) (04/30 0420)   General:   Lethargic, elderly, well-nourished Head:  Right facial bruise and periorbital bruise Eyes:  Anicteric sclerae  ENT:  No deformity or lesions.  Oropharynx pink & moist. Neck:  Supple; no masses or thyromegaly. Lungs:  Clear throughout to auscultation.   No wheezes, crackles, or rhonchi. No acute distress. Heart:  Regular rate and rhythm; no murmurs, clicks, rubs,  or gallops. Abdomen:  Soft, nontender and nondistended. +BS   Rectal:  Deferred Msk:  Symmetrical without gross deformities. Normal posture. Pulses:  Normal pulses noted. Extremities:  Without clubbing or edema. Neurologic:  Alert and  oriented x4;  grossly normal neurologically. Skin:  Intact without significant lesions or rashes. Psych:  Alert and cooperative. Normal mood and affect.   Lab Results:  Recent Labs  03/11/15 2013 03/12/15 0100 03/12/15 0523 03/12/15 1000  WBC 23.8*  --  19.5*  --   HGB 11.6* 11.0* 10.8* 10.7*  HCT 31.7* 30.4* 29.6* 29.4*  PLT 188  --  187  --    BMET  Recent Labs  03/11/15 2013 03/12/15 0523  NA 113* 119*  K 4.3 4.5  CL 84* 89*  CO2 20 20  GLUCOSE 137* 113*  BUN 15 19  CREATININE 0.91 0.93  CALCIUM 8.4 8.2*    No results for input(s): PROT, ALBUMIN, AST, ALT, ALKPHOS, BILITOT, BILIDIR, IBILI in the last 72 hours. PT/INR  Recent Labs  03/11/15 2013 03/12/15 0523  LABPROT 31.0* 22.8*  INR 2.95* 1.99*     Studies/Results: Dg Hip Unilat With Pelvis 2-3 Views Right  03/11/2015   CLINICAL DATA:  Status post fall. Hit head on bed frame. Right hip pain and weakness. Initial encounter.  EXAM: RIGHT HIP (WITH PELVIS) 2-3 VIEWS  COMPARISON:  None.  FINDINGS: There is no evidence of fracture or dislocation. Both femoral heads are seated normally within their respective acetabula. The proximal right femur appears intact. Degenerative  change is noted at the lower lumbar spine. The sacroiliac joints are unremarkable in appearance.  The visualized bowel gas pattern is grossly unremarkable in appearance. Diffuse vascular calcifications are seen.  IMPRESSION: 1. No evidence of fracture or dislocation. 2. Degenerative change at the lower lumbar spine. 3. Diffuse vascular calcifications seen.   Electronically Signed   By: Garald Balding M.D.   On: 03/11/2015 21:45    Impression/Plan: 81 yo with an episode of mainly coffee grounds emesis concerning for an upper GI bleed such as a Mallory Weiss tear vs peptic ulcer vs esophagitis. Doubt malignancy or AVMs. EGD now. Continue Protonix drip. Supportive care.    LOS: 1 day   [place] C.  03/12/2015, 11:57 AM

## 2015-03-12 NOTE — ED Notes (Signed)
Report attempted 

## 2015-03-12 NOTE — Consult Note (Signed)
[name] Admit Date: 03/11/2015 03/12/2015 [name] Requesting Physician:  Rai MD  Reason for Consult:  Hyponatremia HPI:  81 year old male seen at the request of Dr. Tana Coast for the evaluation of hyponatremia. Patient seen in our emergency room on 03/09/15 after dizziness and a fall. He sustained 3 broken ribs on the left side. He had a periorbital hematoma on the right. He also was treated for a bladder infection based on pyuria and increased frequency with ceftriaxone in the emergency room followed by by mouth cephalexin.  Urine culture from that visit demonstrates coagulase-negative Staphylococcus. He returned to the ER yesterday with progressive weakness of the lower extremities.  Serum sodium at the first emergency room visit was 135. He has historically ranged from 130 to 135. Upon presentation on 03/11/15 it was 113.  In his room he is accompanied by his wife and son. They deny any change in cognitive status. He denies any nausea or vomiting. There's been no reported seizure activity. Though he did have a fall a deny that there is any change in his oral intake other than perhaps pushing fluids to accompany his oral antibiotic. He has been weak in the legs when standing but it is unclear if he has had true orthostasis. There's been no peripheral edema, orthopnea, PND, exertional dyspnea. No weight was obtained on 03/09/15. Currently is receiving normal saline at 75 mL/hr. he is able to dissipate and exam, answer questions, denies nausea, vomiting, confusion. Home medicines include no diuretics. His TSH was normal upon presentation. His serum osmolality is 254. His urine osmolality is 339 with a sodium of 17.  In the emergency room the patient developed coffee-ground emesis. He underwent EGD this afternoon demonstrating distal erosive esophagitis with ulcerations and partial stricture.  CREATININE, SER  Date Value  03/12/2015 0.84 mg/dL  03/12/2015 0.93 mg/dL  03/11/2015 0.91 mg/dL   03/09/2015 1.10 mg/dL  03/09/2015 1.19 mg/dL  05/03/2011 0.92 mg/dL  05/03/2011 1.08 mg/dL  02/08/2008 1.27  ] I/Os: I/O last 3 completed shifts: In: 112.5 [I.V.:112.5] Out: 170 [Urine:170]   ROS Balance of 12 systems is negative w/ exceptions as above  PMH  Past Medical History  Diagnosis Date  . A-fib   . RA (rheumatoid arthritis)    PSH  Past Surgical History  Procedure Laterality Date  . Cataract extraction     FH No family history on file. SH  reports that he has been smoking Cigars.  He has never used smokeless tobacco. He reports that he does not drink alcohol or use illicit drugs. Allergies  Allergies  Allergen Reactions  . Shellfish Allergy Anaphylaxis  . Sulfa Antibiotics Anaphylaxis   Home medications Prior to Admission medications   Medication Sig Start Date End Date Taking? Authorizing Provider  cephALEXin (KEFLEX) 500 MG capsule Take 1 capsule (500 mg total) by mouth 3 (three) times daily. Patient taking differently: Take 500 mg by mouth 3 (three) times daily. Started on 03-09-15 03/09/15  Yes Abigail Harris, PA-C  diltiazem (TIAZAC) 360 MG 24 hr capsule Take 360 mg by mouth daily. 02/20/15  Yes Historical Provider, MD  metoprolol succinate (TOPROL-XL) 50 MG 24 hr tablet Take 50 mg by mouth 2 (two) times daily. 02/12/15  Yes Historical Provider, MD  Multiple Vitamins-Minerals (PRESERVISION AREDS) CAPS Take 1 capsule by mouth 2 (two) times daily.   Yes Historical Provider, MD  Omega-3 Fatty Acids (FISH OIL) 1000 MG CAPS Take 2,000 capsules by mouth daily.   Yes Historical Provider, MD  warfarin (COUMADIN) 2 MG tablet Take 2-4 mg by mouth See admin instructions. 2mg  everyday except for thursdays - takes 4mg  on thursdays 02/08/15  Yes Historical Provider, MD    Current Medications Scheduled Meds: . cefTRIAXone (ROCEPHIN)  IV  1 g Intravenous Q24H  . diltiazem  360 mg Oral Daily  . metoprolol succinate  50 mg Oral BID  . multivitamin  1 tablet Oral BID  .  omega-3 acid ethyl esters  2 g Oral Daily  . [START ON 03/15/2015] pantoprazole (PROTONIX) IV  40 mg Intravenous Q12H  . sodium chloride  3 mL Intravenous Q12H   Continuous Infusions: . sodium chloride Stopped (03/12/15 1359)  . pantoprozole (PROTONIX) infusion 8 mg/hr (03/12/15 0931)   PRN Meds:.acetaminophen **OR** acetaminophen, alum & mag hydroxide-simeth, HYDROmorphone (DILAUDID) injection, ondansetron **OR** ondansetron (ZOFRAN) IV, oxyCODONE  CBC  Recent Labs Lab 03/09/15 1018  03/11/15 2013 03/12/15 0100 03/12/15 0523 03/12/15 1000  WBC 11.3*  --  23.8*  --  19.5*  --   NEUTROABS 9.3*  --  21.2*  --   --   --   HGB 12.8*  < > 11.6* 11.0* 10.8* 10.7*  HCT 37.0*  < > 31.7* 30.4* 29.6* 29.4*  MCV 96.1  --  92.4  --  91.6  --   PLT 210  --  188  --  187  --   < > = values in this interval not displayed. Basic Metabolic Panel  Recent Labs Lab 03/09/15 1018 03/09/15 1030 03/11/15 2013 03/12/15 0523 03/12/15 1200  NA 133* 135 113* 119* 118*  K 4.4 4.3 4.3 4.5 4.1  CL 102 101 84* 89* 92*  CO2 24  --  20 20 16*  GLUCOSE 123* 124* 137* 113* 93  BUN 18 20 15 19 22   CREATININE 1.19 1.10 0.91 0.93 0.84  CALCIUM 9.1  --  8.4 8.2* 7.8*    Physical Exam  Blood pressure 103/75, pulse 114, temperature 98.5 F (36.9 C), temperature source Oral, resp. rate 20, height 5\' 10"  (1.778 m), weight 87.6 kg (193 lb 2 oz), SpO2 94 %. GEN: No acute distress. Lying in hospital bed. ENT: Right periorbital hematoma noticed. EYES: EOMI. CV: Tachycardia, IR/IR. No rub. PULM: CTAB, nl wob ABD: s/nt/nd nabs SKIN: no rashes/lesions EXT:No LEE   Assessment/Plan 7M w/ hypotonic ?euvolemic hyponatremia in setting of UTI, rib fractures, UGIB 2/2 esophatigis/ulcer  1. Hypotonic Hyponatremia 1. Favor euvolemia over hypovolemia; clearly not hypervolemic 1. Not on diuretics 2. No clear reduction in PO intake preceding admission 3. No N/V/D 4. BUT, I do note that it rose today after rec some  IVFs... 2. Likely SIADH 2/2 UTI, Pain, Esophagitis in Elderly male 1. TSH WNL 3. Uric acid pending, expect it to be low 4. Would have expected higher U Na... 5. Will stop IVFs for now, follow serial serum Na as ordered already 6. Repeat UNa and UOsm in AM 7. No indication for Vaptan or hypertonic saline 8. Place 1.5L fluid restriction -- currently NPO 2. CNS UTI: On Ceftriaxone 3. UGIB 2/2 erosive esophagitis and esophageal ulcer: per GI 4. L Rib Fractures 5. AFib with RVR: coumadin reversed  Pearson Grippe MD [phone] pgr 03/12/2015, 3:44 PM

## 2015-03-12 NOTE — Op Note (Addendum)
[facility] [place], [id number]   ENDOSCOPY PROCEDURE REPORT  PATIENT: [name], [name]  MR#: [id number] BIRTHDATE: 10/19/1923 , 89  yrs. old GENDER: male ENDOSCOPIST: Wilford Corner, MD REFERRED BY: PROCEDURE DATE:  Mar 15, 2015 PROCEDURE:  EGD, diagnostic ASA CLASS:     Class III INDICATIONS:  hematemesis. MEDICATIONS: Fentanyl 50 mcg IV and Versed 4 mg IV TOPICAL ANESTHETIC: Cetacaine Spray X 2  DESCRIPTION OF PROCEDURE: After the risks benefits and alternatives of the procedure were thoroughly explained, informed consent was obtained.  The Pentax Gastroscope [id number] endoscope was introduced through the mouth and advanced to the second portion of the duodenum , Without limitations.  The instrument was slowly withdrawn as the mucosa was fully examined. Estimated blood loss is zero unless otherwise noted in this procedure report.    Circumferential linear ulcerations in the mid-distal esophagus consistent with moderate to severe erosive esophagitis with scattered pigmented material seen consistent with recent bleeding. A short ulcerated stricture was noted at the GEJ, which caused mild to moderate resistance to passage of a 9 mm endoscope through the stricture into the stomach. Moderate amount of black fluid and dark particles seen in the dependent portion of the stomach. Area of edema and whitish material overlying edematous mucosa seen in the fundus of the stomach. Distal stomach mildly edematous consistent with antral gastritis. Duodenal bulb and 2nd portion of the duodenum normal in appearance.        Retroflexed views revealed Complete view of the proximal stomach not possible due to the dark semi-liquid fluid present that could not be completely aspirated but no active bleeding seen.  Gastritis in fundus noted as stated above. During withdrawal back into the esophagus the distal esophageal stricture remained intact without any  visible fracturing of the mucosa by the endoscope.     The scope was then withdrawn from the patient and the procedure completed.  COMPLICATIONS: There were no immediate complications.  ENDOSCOPIC IMPRESSION:     Distal Erosive Esophagitis (source of recent hematemesis) - no active bleeding (see above) Distal benign-appearing distal esophageal stricture Gastritis   RECOMMENDATIONS:     Continue Protonix drip; Ice chips and sips of water today; Follow H/H; Will need repeat EGD in 4-6 weeks for dilation of the esophageal stricture. Avoid meats as an outpt until dilation has been done   eSigned:  Wilford Corner, MD 03/15/2015 1:35 PM Revised: Mar 15, 2015 1:35 PM   CC:  CPT CODES: ICD CODES:  The ICD and CPT codes recommended by this software are interpretations from the data that the clinical staff has captured with the software.  The verification of the translation of this report to the ICD and CPT codes and modifiers is the sole responsibility of the health care institution and practicing physician where this report was generated.  [employer] will not be held responsible for the validity of the ICD and CPT codes included on this report.  AMA assumes no liability for data contained or not contained herein. CPT is a Designer, television/film set of the [employer].  PATIENT NAME:  [name], [name] MR#: [id number]

## 2015-03-12 NOTE — Brief Op Note (Signed)
Moderate to severe distal erosive esophagitis and a moderately obstructing ulcerated distal esophageal stricture. See endopro for details/recs. Ice chips and sips of water ok today.

## 2015-03-12 NOTE — Progress Notes (Signed)
CRITICAL VALUE ALERT  Critical value received:  Na 119  Date of notification:  03/12/2015  Time of notification:  0636  Critical value read back:Yes.    Nurse who received alert:  Alben Spittle, RN  MD notified (1st page):  [facility] Pager  Time of first page:  [phone]  MD notified (2nd page):  Time of second page:  Responding MD:  Fredirick Maudlin, NP  Time MD responded:  [phone], no new orders received.

## 2015-03-12 NOTE — Progress Notes (Signed)
CRITICAL VALUE ALERT  Critical value received:  Sodium 118  Date of notification: 03/12/2015  Time of notification:  4332  Critical value read back:Yes.    Nurse who received alert:  Shannie Kontos  MD notified (1st page):  Dr. Tana Coast  Time of first page:  1350  MD notified (2nd page):  Time of second page:  Responding MD:  Dr Tana Coast  Time MD responded: 1400

## 2015-03-13 DIAGNOSIS — K920 Hematemesis: Secondary | ICD-10-CM

## 2015-03-13 DIAGNOSIS — I4891 Unspecified atrial fibrillation: Secondary | ICD-10-CM

## 2015-03-13 LAB — HEMOGLOBIN AND HEMATOCRIT, BLOOD
HCT: 28 % — ABNORMAL LOW (ref 39.0–52.0)
HCT: 28.9 % — ABNORMAL LOW (ref 39.0–52.0)
HCT: 32.3 % — ABNORMAL LOW (ref 39.0–52.0)
HEMOGLOBIN: 10.2 g/dL — AB (ref 13.0–17.0)
Hemoglobin: 10.2 g/dL — ABNORMAL LOW (ref 13.0–17.0)
Hemoglobin: 11.8 g/dL — ABNORMAL LOW (ref 13.0–17.0)

## 2015-03-13 LAB — OSMOLALITY, URINE: OSMOLALITY UR: 399 mosm/kg (ref 390–1090)

## 2015-03-13 LAB — BASIC METABOLIC PANEL
ANION GAP: 8 (ref 5–15)
BUN: 21 mg/dL — ABNORMAL HIGH (ref 6–20)
CHLORIDE: 94 mmol/L — AB (ref 101–111)
CO2: 18 mmol/L — ABNORMAL LOW (ref 22–32)
Calcium: 7.8 mg/dL — ABNORMAL LOW (ref 8.9–10.3)
Creatinine, Ser: 0.99 mg/dL (ref 0.61–1.24)
GFR calc non Af Amer: >60 mL/min (ref >60)
Glucose, Bld: 88 mg/dL (ref 70–99)
Potassium: 3.9 mmol/L (ref 3.5–5.1)
Sodium: 120 mmol/L — ABNORMAL LOW (ref 135–145)

## 2015-03-13 LAB — SODIUM
SODIUM: 123 mmol/L — AB (ref 135–145)
SODIUM: 119 mmol/L — AB (ref 135–145)
SODIUM: 124 mmol/L — AB (ref 135–145)
Sodium: 123 mmol/L — ABNORMAL LOW (ref 135–145)
Sodium: 122 mmol/L — ABNORMAL LOW (ref 135–145)

## 2015-03-13 LAB — MAGNESIUM: Magnesium: 1.9 mg/dL (ref 1.7–2.4)

## 2015-03-13 LAB — PROTIME-INR
INR: 1.45 (ref 0.00–1.49)
Prothrombin Time: 17.8 seconds — ABNORMAL HIGH (ref 11.6–15.2)

## 2015-03-13 LAB — SODIUM, URINE, RANDOM: Sodium, Ur: 13 mmol/L

## 2015-03-13 NOTE — Progress Notes (Signed)
Patient ID: [name], male   DOB: [birth year], 81 y.o.   MRN: [id number] [facility] Gastroenterology Progress Note  [name] 81 y.o. 09-18-1923   Subjective: Feels good. More alert. No BMs overnight. Wife and son in room.  Objective: Vital signs in last 24 hours: Filed Vitals:   03/13/15 1146  BP: 110/81  Pulse: 112  Temp: 97.8 F (36.6 C)  Resp: 23    Physical Exam: Gen: alert, elderly, no acute distress CV: RRR Chest: CTA B Abd: soft, nontender, nondistended, +BS  Lab Results:  Recent Labs  03/12/15 1200  03/13/15 0300 03/13/15 0333  NA 118*  < > 120* 124*  K 4.1  --  3.9  --   CL 92*  --  94*  --   CO2 16*  --  18*  --   GLUCOSE 93  --  88  --   BUN 22  --  21*  --   CREATININE 0.84  --  0.99  --   CALCIUM 7.8*  --  7.8*  --   < > = values in this interval not displayed. No results for input(s): AST, ALT, ALKPHOS, BILITOT, PROT, ALBUMIN in the last 72 hours.  Recent Labs  03/11/15 2013  03/12/15 0523  03/13/15 0125 03/13/15 0840  WBC 23.8*  --  19.5*  --   --   --   NEUTROABS 21.2*  --   --   --   --   --   HGB 11.6*  < > 10.8*  < > 10.2* 10.2*  HCT 31.7*  < > 29.6*  < > 28.0* 28.9*  MCV 92.4  --  91.6  --   --   --   PLT 188  --  187  --   --   --   < > = values in this interval not displayed.  Recent Labs  03/12/15 0523 03/13/15 0300  LABPROT 22.8* 17.8*  INR 1.99* 1.45      Assessment/Plan: 81 yo s/p upper GI bleed from erosive esophagitis who also has a long history of intermittent solid food dysphagia and has an ulcerated distal esophageal stricture seen on EGD yesterday that will need dilation as an outpatient. No further bleeding and Hgb stable at 10.2. Continue Protonix drip today and change to Protonix 40 mg IV Q12 hours tomorrow if continues to do ok without further bleeding. Start clear liquids for lunch and if tolerates advance to full liquids for dinner. If doing ok with full liquids, then can advance to pureed tomorrow morning and  then soft foods but would not advance beyond soft food until dilation done. Dr. Ronna Polio to see tomorrow.   Thomas C. 03/13/2015, 1:45 PM

## 2015-03-13 NOTE — Evaluation (Signed)
Physical Therapy Evaluation Patient Details Name: [name] MRN: [id number] DOB: Oct 11, 1923 Today's Date: 03/13/2015   History of Present Illness  Patient is a 81 year old male with A.Fib on Coumadin Rx presented to ED with generalized weakness, and in lower extremities, unable to walk. He was also having increased pain in his right hip since his fall on Wednesday 4/27. UGIB, rib fxs; also being treated for UTI   Clinical Impression  Pt admitted with above diagnosis. Pt currently with functional limitations due to the deficits listed below (see PT Problem List).  Pt will benefit from skilled PT to increase their independence and safety with mobility to allow discharge to the venue listed below.       Follow Up Recommendations CIR    Equipment Recommendations  Rolling walker with 5" wheels;3in1 (PT)    Recommendations for Other Services Rehab consult     Precautions / Restrictions Precautions Precautions: Fall      Mobility  Bed Mobility Overal bed mobility: Needs Assistance Bed Mobility: Rolling;Sidelying to Sit Rolling: Min guard Sidelying to sit: Mod assist       General bed mobility comments: good use of rail to roll; mod assist to push up to sit; used bed pad to square off hips  Transfers Overall transfer level: Needs assistance Equipment used: Rolling walker (2 wheeled) Transfers: Sit to/from Stand Sit to Stand: Mod assist         General transfer comment: Mod assist to power up and noted dependent on momentum; cues for proper hand placement; mod assisst to control descent to sit  Ambulation/Gait Ambulation/Gait assistance: +2 safety/equipment;Mod assist;Min assist Ambulation Distance (Feet): 30 Feet Assistive device: Rolling walker (2 wheeled) Gait Pattern/deviations: Shuffle;Decreased step length - right;Decreased step length - left;Trunk flexed Gait velocity: quite slow   General Gait Details: Cues for upright posture and RW proximity; second person  helpful for chair behind; noted decr stance stability RLE  Stairs            Wheelchair Mobility    Modified Rankin (Stroke Patients Only)       Balance Overall balance assessment: Needs assistance         Standing balance support: Bilateral upper extremity supported Standing balance-Leahy Scale: Poor Standing balance comment: Heavy reliance on UEs                             Pertinent Vitals/Pain Pain Assessment: Faces Faces Pain Scale: Hurts even more Pain Location: RLE with getting up, especially coming off the bed Pain Descriptors / Indicators: Aching;Grimacing Pain Intervention(s): Limited activity within patient's tolerance;Monitored during session    Home Living Family/patient expects to be discharged to:: Private residence Living Arrangements: Spouse/significant other Available Help at Discharge: Family;Available 24 hours/day Type of Home: House Home Access: Stairs to enter Entrance Stairs-Rails: Right Entrance Stairs-Number of Steps: 5 Home Layout: One level Home Equipment: None      Prior Function Level of Independence: Independent               Hand Dominance        Extremity/Trunk Assessment   Upper Extremity Assessment: Generalized weakness           Lower Extremity Assessment: Generalized weakness (RLE weaker than LLE)      Cervical / Trunk Assessment:  (stiff)  Communication   Communication: HOH  Cognition Arousal/Alertness: Awake/alert Behavior During Therapy: WFL for tasks assessed/performed Overall Cognitive Status: Within Functional Limits for tasks  assessed                      General Comments      Exercises General Exercises - Lower Extremity Long Arc Quad: AROM;Right;Left;10 reps (alternating)      Assessment/Plan    PT Assessment Patient needs continued PT services  PT Diagnosis Difficulty walking;Abnormality of gait;Generalized weakness   PT Problem List Decreased  strength;Decreased range of motion;Decreased activity tolerance;Decreased balance;Decreased mobility;Decreased coordination;Decreased knowledge of use of DME;Decreased knowledge of precautions;Pain  PT Treatment Interventions DME instruction;Gait training;Stair training;Functional mobility training;Therapeutic activities;Therapeutic exercise;Patient/family education   PT Goals (Current goals can be found in the Care Plan section) Acute Rehab PT Goals Patient Stated Goal: very motivated to exercise and get better PT Goal Formulation: With patient Time For Goal Achievement: 03/27/15 Potential to Achieve Goals: Good    Frequency Min 3X/week   Barriers to discharge        Co-evaluation               End of Session Equipment Utilized During Treatment: Gait belt Activity Tolerance: Patient tolerated treatment well Patient left: in chair;with call bell/phone within reach;with family/visitor present Nurse Communication: Mobility status;Other (comment) (needs incentive spirometer)         Time: 5852-7782 PT Time Calculation (min) (ACUTE ONLY): 35 min   Charges:   PT Evaluation $Initial PT Evaluation Tier I: 1 Procedure PT Treatments $Gait Training: 8-22 mins   PT G Codes:        Quin Hoop 03/13/2015, 4:46 PM  Roney [place], [place] Pager [phone] Office [phone]

## 2015-03-13 NOTE — Progress Notes (Signed)
Admit: 03/11/2015 LOS: 2  6M w/ hypotonic ?euvolemic hyponatremia in setting of UTI, rib fractures, UGIB 2/2 esophatigis/ulcer  Subjective:  No overnight events Minimal intake, nothing by mouth still No confusion, nausea, vomiting Sodium level increased to 124 this morning Urine osmolality 399 this morning, urine sodium 13 Uric acid yesterday 3.7 Hemoglobin stable  SODIUM (mmol/L)  Date Value  03/13/2015 124*  03/13/2015 120*  03/13/2015 123*  03/12/2015 119*  03/12/2015 121*  03/12/2015 122*  03/12/2015 118*  03/12/2015 119*  03/11/2015 113*  03/09/2015 135  ]  04/30 0701 - 05/01 0700 In: 628 [I.V.:578; IV Piggyback:50] Out: 550 [Urine:550]  Filed Weights   03/12/15 0420 03/13/15 0318  Weight: 87.6 kg (193 lb 2 oz) 88 kg (194 lb 0.1 oz)    Scheduled Meds: . cefTRIAXone (ROCEPHIN)  IV  1 g Intravenous Q24H  . diltiazem  360 mg Oral Daily  . metoprolol succinate  50 mg Oral BID  . multivitamin  1 tablet Oral BID  . omega-3 acid ethyl esters  2 g Oral Daily  . [START ON 03/15/2015] pantoprazole (PROTONIX) IV  40 mg Intravenous Q12H  . sodium chloride  3 mL Intravenous Q12H   Continuous Infusions: . pantoprozole (PROTONIX) infusion 8 mg/hr (03/12/15 2340)   PRN Meds:.acetaminophen **OR** acetaminophen, alum & mag hydroxide-simeth, HYDROmorphone (DILAUDID) injection, ondansetron **OR** ondansetron (ZOFRAN) IV, oxyCODONE  Current Labs: reviewed    Physical Exam:  Blood pressure 135/73, pulse 117, temperature 98.7 F (37.1 C), temperature source Oral, resp. rate 24, height 5\' 10"  (1.778 m), weight 88 kg (194 lb 0.1 oz), SpO2 95 %. GEN: No acute distress. Lying in hospital bed. ENT: Right periorbital hematoma noticed. EYES: EOMI. CV: Tachycardia, IR/IR. No rub. PULM: CTAB, nl wob ABD: s/nt/nd nabs SKIN: no rashes/lesions EXT:No LEE  A/P 1. Euvolemic Hypotonic Hyponatremia consistent with SIADH 1. Urine Osms (399) were inappropriate 2. Favor euvolemia over  hypovolemia; clearly not hypervolemic 1. Not on diuretics 2. No clear reduction in PO intake preceding admission 3. No N/V/D 3. Likely SIADH 2/2 UTI, Pain, Esophagitis in Elderly male 1. TSH WNL 4. Uric Acid 3.7 5. Would have expected higher U Na... 6. Cont to hold IVFs, when PO s tarted, 1.5L fluid restriction 7. No indication for Vaptan or hypertonic saline 8. Should cont to slowly trend up 9. With patient and family discussed benefit of high solute intake, especially protein 2. CNS UTI: On Ceftriaxone and was sensitive to oxacillin 3. UGIB 2/2 erosive esophagitis and esophageal ulcer: per GI 4. L Rib Fractures: Provide incentive spirometer 5. AFib with RVR: coumadin reversed, mild RVR this morning  Pearson Grippe MD 03/13/2015, 8:10 AM   Recent Labs Lab 03/12/15 0523 03/12/15 1200  03/13/15 0125 03/13/15 0300 03/13/15 0333  NA 119* 118*  < > 123* 120* 124*  K 4.5 4.1  --   --  3.9  --   CL 89* 92*  --   --  94*  --   CO2 20 16*  --   --  18*  --   GLUCOSE 113* 93  --   --  88  --   BUN 19 22  --   --  21*  --   CREATININE 0.93 0.84  --   --  0.99  --   CALCIUM 8.2* 7.8*  --   --  7.8*  --   < > = values in this interval not displayed.  Recent Labs Lab 03/09/15 1018  03/11/15 2013  03/12/15 0523 03/12/15 1000 03/12/15 1710 03/13/15 0125  WBC 11.3*  --  23.8*  --  19.5*  --   --   --   NEUTROABS 9.3*  --  21.2*  --   --   --   --   --   HGB 12.8*  < > 11.6*  < > 10.8* 10.7* 10.8* 10.2*  HCT 37.0*  < > 31.7*  < > 29.6* 29.4* 29.9* 28.0*  MCV 96.1  --  92.4  --  91.6  --   --   --   PLT 210  --  188  --  187  --   --   --   < > = values in this interval not displayed.

## 2015-03-13 NOTE — Consult Note (Signed)
Primary cardiologist: Dr Wynonia Lawman Consulting cardiologist: Dr Carlyle Dolly MD  Clinical Summary [name] is a 81 y.o.male hx of afib/aflutter on coumadin, rheumatoid arthritis, admitted with generalized weakness and difficulty walking. Found to be severely hyponatremic with Na of 113. Also during admit had upper GI bleed due to esophageal erosion, and is also being treated for a UTI. Cardiology is consulted for tachycardia.    INR 2.95, Na 113, K 4.3, Cr 0.91, WBC 23.8, Hgb 11.6, lactic acid 0.84, TSH 1.2,     Allergies  Allergen Reactions  . Shellfish Allergy Anaphylaxis  . Sulfa Antibiotics Anaphylaxis    Medications Scheduled Medications: . cefTRIAXone (ROCEPHIN)  IV  1 g Intravenous Q24H  . diltiazem  360 mg Oral Daily  . metoprolol succinate  50 mg Oral BID  . multivitamin  1 tablet Oral BID  . omega-3 acid ethyl esters  2 g Oral Daily  . [START ON 03/15/2015] pantoprazole (PROTONIX) IV  40 mg Intravenous Q12H  . sodium chloride  3 mL Intravenous Q12H     Infusions: . pantoprozole (PROTONIX) infusion 8 mg/hr (03/13/15 1300)     PRN Medications:  acetaminophen **OR** acetaminophen, alum & mag hydroxide-simeth, HYDROmorphone (DILAUDID) injection, ondansetron **OR** ondansetron (ZOFRAN) IV, oxyCODONE   Past Medical History  Diagnosis Date  . A-fib   . RA (rheumatoid arthritis)     Past Surgical History  Procedure Laterality Date  . Cataract extraction      No family history on file.  Social History [name] reports that he has been smoking Cigars.  He has never used smokeless tobacco. [name] reports that he does not drink alcohol.  Review of Systems CONSTITUTIONAL: No weight loss, fever, chills, weakness or fatigue.  HEENT: Eyes: No visual loss, blurred vision, double vision or yellow sclerae. No hearing loss, sneezing, congestion, runny nose or sore throat.  SKIN: No rash or itching.  CARDIOVASCULAR: No chest pain, chest pressure or chest  discomfort. No palpitations or edema.  RESPIRATORY: No shortness of breath, cough or sputum.  GASTROINTESTINAL: No anorexia, nausea, vomiting or diarrhea. No abdominal pain or blood.  GENITOURINARY: no polyuria, no dysuria NEUROLOGICAL: No headache, dizziness, syncope, paralysis, ataxia, numbness or tingling in the extremities. No change in bowel or bladder control.  MUSCULOSKELETAL: No muscle, back pain, joint pain or stiffness.  HEMATOLOGIC: No anemia, bleeding or bruising.  LYMPHATICS: No enlarged nodes. No history of splenectomy.  PSYCHIATRIC: No history of depression or anxiety.      Physical Examination Blood pressure 110/81, pulse 112, temperature 97.8 F (36.6 C), temperature source Oral, resp. rate 23, height 5\' 10"  (1.778 m), weight 194 lb 0.1 oz (88 kg), SpO2 95 %.  Intake/Output Summary (Last 24 hours) at 03/13/15 1457 Last data filed at 03/13/15 1200  Gross per 24 hour  Intake    625 ml  Output   1050 ml  Net   -425 ml    HEENT: sclera clear  Cardiovascular: irreg, no m/r/g, no JVD  Respiratory: CTAB  GI: abdomen soft, NT, ND  MSK: no LE edema  Neuro: no focal deficits  Psych: appropriate affect   Lab Results  Basic Metabolic Panel:  Recent Labs Lab 03/09/15 1018 03/09/15 1030 03/11/15 2013 03/12/15 0523 03/12/15 1200  03/12/15 2053 03/12/15 2337 03/13/15 0125 03/13/15 0300 03/13/15 0333  NA 133* 135 113* 119* 118*  < > 121* 119* 123* 120* 124*  K 4.4 4.3 4.3 4.5 4.1  --   --   --   --  3.9  --   CL 102 101 84* 89* 92*  --   --   --   --  94*  --   CO2 24  --  20 20 16*  --   --   --   --  18*  --   GLUCOSE 123* 124* 137* 113* 93  --   --   --   --  88  --   BUN 18 20 15 19 22   --   --   --   --  21*  --   CREATININE 1.19 1.10 0.91 0.93 0.84  --   --   --   --  0.99  --   CALCIUM 9.1  --  8.4 8.2* 7.8*  --   --   --   --  7.8*  --   < > = values in this interval not displayed.  Liver Function Tests: No results for input(s): AST, ALT,  ALKPHOS, BILITOT, PROT, ALBUMIN in the last 168 hours.  CBC:  Recent Labs Lab 03/09/15 1018  03/11/15 2013  03/12/15 0523 03/12/15 1000 03/12/15 1710 03/13/15 0125 03/13/15 0840  WBC 11.3*  --  23.8*  --  19.5*  --   --   --   --   NEUTROABS 9.3*  --  21.2*  --   --   --   --   --   --   HGB 12.8*  < > 11.6*  < > 10.8* 10.7* 10.8* 10.2* 10.2*  HCT 37.0*  < > 31.7*  < > 29.6* 29.4* 29.9* 28.0* 28.9*  MCV 96.1  --  92.4  --  91.6  --   --   --   --   PLT 210  --  188  --  187  --   --   --   --   < > = values in this interval not displayed.  Cardiac Enzymes: No results for input(s): CKTOTAL, CKMB, CKMBINDEX, TROPONINI in the last 168 hours.  BNP: Invalid input(s): POCBNP    Impression/Recommendations  1. Tachycardia/Afib/Aflutter - notes mention history of afib, old EKGs show aflutter as far back as 2012 - most recent EKG 03/10/15 shows aflutter with variable conduction. Will repeat. Tele reviews shows aflutter with variable rates - normal TSH, K normal. Will add on Mg.  - currently on dilt CD 360, Toprol XL 50mg  bid. Stable blood pressures.  - echo pending  - rates currently in the 80s.If becomes elevated again would increase Toprol XL to 75mg  bid. Should also improve once his multiple other medical issues improve.     2. Hyponatremia - admit Na 113, management per primary team. Has increased to 121. From notes Na trending up with fluid restriction, suspected SIADH.    3. Hematemesis - followed by medicine and GI - EGD this admit showed upper GI bleed due to erosive esophagitis with ulcerated distal esophageal stricture - transfused 2 units prbc's  4. UTI - abx per primary team  5. Afib - coumadin on hold in setting of GI bleed. Admit WBC 23.5  Zandra Abts MD

## 2015-03-13 NOTE — Progress Notes (Signed)
CRITICAL VALUE ALERT  Critical value received:  Sodium 119  Date of notification:  03/13/2015  Time of notification:  8875  Critical value read back:Yes.    Nurse who received alert:  Dorene Grebe, RN  MD notified (1st page):  [facility]  Time of first page:  0050  MD notified (2nd page):  Time of second page:  Responding MD:  Kathline Magic, NP  Time MD responded:  [id number], no new orders received.

## 2015-03-13 NOTE — Progress Notes (Signed)
[facility]                                                                              Patient Demographics  [name], is a 81 y.o. male, DOB - [birth year], YPP:[id number]  Admit date - 03/11/2015   Admitting Physician Theressa Millard, MD   Referring physician: Dr. Thurnell Garbe  Outpatient Primary MD for the patient is Merrilee Seashore, MD   Primary cardiologist: Dr Wynonia Lawman  LOS - 2   Chief Complaint  Patient presents with  . Leg Pain  . Weakness       Brief HPI   Patient is a 81 year old male with A.Fib on Coumadin Rx presented to ED with generalized weakness, and in lower extremities, unable to walk. He was also having increased pain in his right hip since his fall on Wednesday 4/27. He was evaluated in the ED and had x-rays and a ct scan of the head which was also negative for acute findings except for a right frontal scalp hematoma.He was discharged home from the ER on 4/27.   He was evaluated in the ED on 4/29 for severe generalized weakness and was found to have hyponatremia. He denied any nausea, vomiting or diarrhea but admitted that he had not been eating too well in the last few days. No diuretic therapy. In the ED, patient had an episode vomiting of 200 cc of coffee ground material- he denied any abdominal pain, melena or hematochezia. He was a admitted to stepdown unit and placed on IV PPI drip. INR was reversed.  Subjective:   Patient examined earlier today. He has yet to try to clear liquids that have been ordered for him. No c/o nausea, abdominal pain or diarrhea. Has mild left sided chest pain when coughing. Has not yet been out of bed.   Assessment & Plan    Principal Problem:   Hyponatremia: - Sodium was 113 at the time of admission, Serum osmolarity 249, urine osmolarity 339 - Urine sodium 17- repeated found to be 13 - As sodium did not improve significantly with IVF, Nephrology was consulted. Dr Joelyn Oms placed him on a 1.5 L fluid  restriction and IVF were discontinued - sodium is noted to be improving steadily-  he is suspecting SIADH in relation to pain, UTI, esophagitis   Active Problems: Anemia with Upper GI bleed - Continue Protonix infusion -  no further gross bleeding, hemoglobin currently stable - GI consulted- EGD done 4/30: distal erosive esophagitis likely cause of hematemesis, distal benign appearing stricture - advanced to clear liquids today by GI -advised patient to avoid NSAIDS    UTI (lower urinary tract infection) - Culture reveals coag neg staph- although asymptomatic, UA grossly positive - started on IV Rocephin- can stop after third dose today- have discussed with ID, high chance this is colonization rather than a UTI as he is asymptomatic    A-fib- currently junctional tachycardia? - HR in 100-120 for past 3 days- no P waves noted- have consulted Cardiology to evaluate- they have asked for an ECHO as no recent ECHO in Epic - This patients CHA2DS2-VASc Score at minimum 3-  - Continue  Cardizem and Metoprolol - Hold Coumadin due to upper GI bleed. INR  Improved to 1.45 - GI to advise when to resume Coumadin  Generalized  Weakness -PT/OT evaluation pending- he is willing to go to SNF if it is determined that he needs to     Left rib fracture- 5th, 6th, 7thSecondary to fall with a scalp hematoma -  chest pain is only mild and well controlled with PRN Tylenol   Code Status: DO NOT RESUSCITATE  Family Communication: Discussed in detail with son and wife at bedside   Disposition Plan: Remain in stepdown unit today - f/u on PT eval and tachycardia  Time Spent in minutes  25 mins  Procedures  None  Consults   Gastroenterology    DVT Prophylaxis  SCDs   Medications  Scheduled Meds: . cefTRIAXone (ROCEPHIN)  IV  1 g Intravenous Q24H  . diltiazem  360 mg Oral Daily  . metoprolol succinate  50 mg Oral BID  . multivitamin  1 tablet Oral BID  . omega-3 acid ethyl esters  2 g Oral  Daily  . [START ON 03/15/2015] pantoprazole (PROTONIX) IV  40 mg Intravenous Q12H  . sodium chloride  3 mL Intravenous Q12H   Continuous Infusions: . pantoprozole (PROTONIX) infusion 8 mg/hr (03/13/15 1300)   PRN Meds:.acetaminophen **OR** acetaminophen, alum & mag hydroxide-simeth, HYDROmorphone (DILAUDID) injection, ondansetron **OR** ondansetron (ZOFRAN) IV, oxyCODONE   Antibiotics   Anti-infectives    Start     Dose/Rate Route Frequency Ordered Stop   03/12/15 2115  cefTRIAXone (ROCEPHIN) 1 g in dextrose 5 % 50 mL IVPB     1 g 100 mL/hr over 30 Minutes Intravenous Every 24 hours 03/11/15 2342     03/11/15 2115  cefTRIAXone (ROCEPHIN) 1 g in dextrose 5 % 50 mL IVPB     1 g 100 mL/hr over 30 Minutes Intravenous  Once 03/11/15 2108 03/11/15 2345      Objective:   Blood pressure 110/81, pulse 112, temperature 97.8 F (36.6 C), temperature source Oral, resp. rate 23, height 5\' 10"  (1.778 m), weight 88 kg (194 lb 0.1 oz), SpO2 95 %.  Wt Readings from Last 3 Encounters:  03/13/15 88 kg (194 lb 0.1 oz)     Intake/Output Summary (Last 24 hours) at 03/13/15 1313 Last data filed at 03/13/15 1200  Gross per 24 hour  Intake    625 ml  Output   1050 ml  Net   -425 ml    Exam  General: Alert and oriented x 3, NAD, right frontal hematoma, right periorbital ecchymosis   HEENT:  PERRLA, EOMI, Anicteic Sclera, mucous membranes moist.   Neck: Supple, no JVD, no masses  CVS: S1 S2 auscultated, no rubs, murmurs or gallops. Regular rate and rhythm- tachycardia with HR in low 100s.  Respiratory: Clear to auscultation bilaterally, no wheezing, rales or rhonchi  Abdomen: Soft, nontender, nondistended, + bowel sounds  Ext: no cyanosis clubbing or edema  Neuro: AAOx3, Cr N's II- XII. Strength 5/5 upper and lower extremities bilaterally  Skin: No rashes  Psych: Normal affect and demeanor, alert and oriented x3    Data Review   Micro Results Recent Results (from the past 240  hour(s))  Urine culture     Status: None   Collection Time: 03/09/15 10:00 AM  Result Value Ref Range Status   Specimen Description URINE, CLEAN CATCH  Final   Special Requests NONE  Final   Colony Count   Final    >=  100,000 COLONIES/ML Performed at [facility]    Culture   Final    STAPHYLOCOCCUS SPECIES (COAGULASE NEGATIVE) Note: RIFAMPIN AND GENTAMICIN SHOULD NOT BE USED AS SINGLE DRUGS FOR TREATMENT OF STAPH INFECTIONS. Performed at [facility]    Report Status 03/12/2015 FINAL  Final   Organism ID, Bacteria STAPHYLOCOCCUS SPECIES (COAGULASE NEGATIVE)  Final      Susceptibility   Staphylococcus species (coagulase negative) - MIC*    GENTAMICIN <=0.5 SENSITIVE Sensitive     LEVOFLOXACIN <=0.12 SENSITIVE Sensitive     NITROFURANTOIN <=16 SENSITIVE Sensitive     OXACILLIN <=0.25 SENSITIVE Sensitive     PENICILLIN >=0.5 RESISTANT Resistant     RIFAMPIN <=0.5 SENSITIVE Sensitive     TRIMETH/SULFA <=10 SENSITIVE Sensitive     VANCOMYCIN <=0.5 SENSITIVE Sensitive     TETRACYCLINE <=1 SENSITIVE Sensitive     * STAPHYLOCOCCUS SPECIES (COAGULASE NEGATIVE)  MRSA PCR Screening     Status: None   Collection Time: 03/12/15  4:32 AM  Result Value Ref Range Status   MRSA by PCR NEGATIVE NEGATIVE Final    Comment:        The GeneXpert MRSA Assay (FDA approved for NASAL specimens only), is one component of a comprehensive MRSA colonization surveillance program. It is not intended to diagnose MRSA infection nor to guide or monitor treatment for MRSA infections.   Culture, blood (routine x 2)     Status: None (Preliminary result)   Collection Time: 03/12/15  9:50 AM  Result Value Ref Range Status   Specimen Description BLOOD RIGHT HAND  Final   Special Requests BOTTLES DRAWN AEROBIC ONLY  3CC  Final   Culture   Final           BLOOD CULTURE RECEIVED NO GROWTH TO DATE CULTURE WILL BE HELD FOR 5 DAYS BEFORE ISSUING A FINAL NEGATIVE REPORT Performed at [facility]    Report Status PENDING  Incomplete  Culture, blood (routine x 2)     Status: None (Preliminary result)   Collection Time: 03/12/15 10:00 AM  Result Value Ref Range Status   Specimen Description BLOOD LEFT HAND  Final   Special Requests BOTTLES DRAWN AEROBIC ONLY  3CC  Final   Culture   Final           BLOOD CULTURE RECEIVED NO GROWTH TO DATE CULTURE WILL BE HELD FOR 5 DAYS BEFORE ISSUING A FINAL NEGATIVE REPORT Performed at [facility]    Report Status PENDING  Incomplete    Radiology Reports Dg Ribs Unilateral W/chest Left  03/09/2015   CLINICAL DATA:  Status post fall today.  Anterior left rib pain.  EXAM: LEFT RIBS AND CHEST - 3+ VIEW  COMPARISON:  Single view of the chest 05/03/2011. CT chest 05/04/2011.  FINDINGS: Subsegmental atelectasis is seen in the left lung base. The lungs are otherwise clear. There is no pneumothorax or pleural effusion. Acute, nondisplaced fractures are seen in the left fifth through seventh ribs. No other fracture is identified.  IMPRESSION: Acute left fifth through seventh rib fractures. Negative for pneumothorax.  Cardiomegaly without edema.  Subsegmental atelectasis left lung base.   Electronically Signed   By: Inge Rise M.D.   On: 03/09/2015 11:13   Ct Head Wo Contrast  03/09/2015   CLINICAL DATA:  Patient became dizzy and fell, hitting head on bed frame. Complaining of head and neck pain  EXAM: CT HEAD WITHOUT CONTRAST  CT CERVICAL SPINE WITHOUT CONTRAST  TECHNIQUE: Multidetector CT imaging of the head and cervical spine was performed following the standard protocol without intravenous contrast. Multiplanar CT image reconstructions of the cervical spine were also generated.  COMPARISON:  None.  FINDINGS: CT HEAD FINDINGS  There is moderate diffuse atrophy. There is no intracranial mass, hemorrhage, extra-axial fluid collection, or midline shift. There is mild small vessel disease in the centra semiovale bilaterally. No acute infarct  apparent. Bony calvarium appears intact. The mastoid air cells are clear. There is a right frontal scalp hematoma. There is mild mucosal thickening in the inferior right maxillary antrum.  CT CERVICAL SPINE FINDINGS  There is no apparent fracture. There is 3 mm of anterolisthesis of C2 on C3. There is 5 mm of anterolisthesis of C3 on C4. There is 1 mm of retrolisthesis of C4 on C5. There is 1 mm of retrolisthesis of C5 on C6. There is 2 mm of anterolisthesis of C7 on T1. These areas of spondylolisthesis are felt to be secondary to underlying spondylosis. The prevertebral soft tissues and predental space regions are normal. There is severe disc space narrowing at C4-5, C5-6, and C6-7. There is moderate narrowing at all other levels. There is facet hypertrophy to varying degrees at all levels bilaterally. There is no frank disc extrusion or stenosis. There is exit foraminal narrowing due to bony hypertrophy at multiple levels, most marked at C5-6 on the left.  IMPRESSION: CT head: Atrophy with mild periventricular small vessel disease. No intracranial mass, hemorrhage, or subdural/epidural hematoma. No acute infarct apparent. Right frontal scalp hematoma.  CT cervical spine: Extensive spondylosis. Mild spondylolisthesis at most levels, felt to be due to spondylosis. No fracture apparent.   Electronically Signed   By: Lowella Grip III M.D.   On: 03/09/2015 10:47   Ct Cervical Spine Wo Contrast  03/09/2015   CLINICAL DATA:  Patient became dizzy and fell, hitting head on bed frame. Complaining of head and neck pain  EXAM: CT HEAD WITHOUT CONTRAST  CT CERVICAL SPINE WITHOUT CONTRAST  TECHNIQUE: Multidetector CT imaging of the head and cervical spine was performed following the standard protocol without intravenous contrast. Multiplanar CT image reconstructions of the cervical spine were also generated.  COMPARISON:  None.  FINDINGS: CT HEAD FINDINGS  There is moderate diffuse atrophy. There is no intracranial  mass, hemorrhage, extra-axial fluid collection, or midline shift. There is mild small vessel disease in the centra semiovale bilaterally. No acute infarct apparent. Bony calvarium appears intact. The mastoid air cells are clear. There is a right frontal scalp hematoma. There is mild mucosal thickening in the inferior right maxillary antrum.  CT CERVICAL SPINE FINDINGS  There is no apparent fracture. There is 3 mm of anterolisthesis of C2 on C3. There is 5 mm of anterolisthesis of C3 on C4. There is 1 mm of retrolisthesis of C4 on C5. There is 1 mm of retrolisthesis of C5 on C6. There is 2 mm of anterolisthesis of C7 on T1. These areas of spondylolisthesis are felt to be secondary to underlying spondylosis. The prevertebral soft tissues and predental space regions are normal. There is severe disc space narrowing at C4-5, C5-6, and C6-7. There is moderate narrowing at all other levels. There is facet hypertrophy to varying degrees at all levels bilaterally. There is no frank disc extrusion or stenosis. There is exit foraminal narrowing due to bony hypertrophy at multiple levels, most marked at C5-6 on the left.  IMPRESSION: CT head: Atrophy with mild periventricular small vessel disease. No  intracranial mass, hemorrhage, or subdural/epidural hematoma. No acute infarct apparent. Right frontal scalp hematoma.  CT cervical spine: Extensive spondylosis. Mild spondylolisthesis at most levels, felt to be due to spondylosis. No fracture apparent.   Electronically Signed   By: Lowella Grip III M.D.   On: 03/09/2015 10:47   Dg Hip Unilat With Pelvis 2-3 Views Right  03/11/2015   CLINICAL DATA:  Status post fall. Hit head on bed frame. Right hip pain and weakness. Initial encounter.  EXAM: RIGHT HIP (WITH PELVIS) 2-3 VIEWS  COMPARISON:  None.  FINDINGS: There is no evidence of fracture or dislocation. Both femoral heads are seated normally within their respective acetabula. The proximal right femur appears intact.  Degenerative change is noted at the lower lumbar spine. The sacroiliac joints are unremarkable in appearance.  The visualized bowel gas pattern is grossly unremarkable in appearance. Diffuse vascular calcifications are seen.  IMPRESSION: 1. No evidence of fracture or dislocation. 2. Degenerative change at the lower lumbar spine. 3. Diffuse vascular calcifications seen.   Electronically Signed   By: Garald Balding M.D.   On: 03/11/2015 21:45    CBC  Recent Labs Lab 03/09/15 1018  03/11/15 2013  03/12/15 0523 03/12/15 1000 03/12/15 1710 03/13/15 0125 03/13/15 0840  WBC 11.3*  --  23.8*  --  19.5*  --   --   --   --   HGB 12.8*  < > 11.6*  < > 10.8* 10.7* 10.8* 10.2* 10.2*  HCT 37.0*  < > 31.7*  < > 29.6* 29.4* 29.9* 28.0* 28.9*  PLT 210  --  188  --  187  --   --   --   --   MCV 96.1  --  92.4  --  91.6  --   --   --   --   MCH 33.2  --  33.8  --  33.4  --   --   --   --   MCHC 34.6  --  36.6*  --  36.5*  --   --   --   --   RDW 14.3  --  13.5  --  13.5  --   --   --   --   LYMPHSABS 0.9  --  0.6*  --   --   --   --   --   --   MONOABS 1.0  --  1.9*  --   --   --   --   --   --   EOSABS 0.2  --  0.0  --   --   --   --   --   --   BASOSABS 0.0  --  0.0  --   --   --   --   --   --   < > = values in this interval not displayed.  Chemistries   Recent Labs Lab 03/09/15 1018 03/09/15 1030 03/11/15 2013 03/12/15 0523 03/12/15 1200  03/12/15 2053 03/12/15 2337 03/13/15 0125 03/13/15 0300 03/13/15 0333  NA 133* 135 113* 119* 118*  < > 121* 119* 123* 120* 124*  K 4.4 4.3 4.3 4.5 4.1  --   --   --   --  3.9  --   CL 102 101 84* 89* 92*  --   --   --   --  94*  --   CO2 24  --  20 20 16*  --   --   --   --  18*  --   GLUCOSE 123* 124* 137* 113* 93  --   --   --   --  88  --   BUN 18 20 15 19 22   --   --   --   --  21*  --   CREATININE 1.19 1.10 0.91 0.93 0.84  --   --   --   --  0.99  --   CALCIUM 9.1  --  8.4 8.2* 7.8*  --   --   --   --  7.8*  --   < > = values in this interval  not displayed. ------------------------------------------------------------------------------------------------------------------ estimated creatinine clearance is 56.5 mL/min (by C-G formula based on Cr of 0.99). ------------------------------------------------------------------------------------------------------------------ No results for input(s): HGBA1C in the last 72 hours. ------------------------------------------------------------------------------------------------------------------ No results for input(s): CHOL, HDL, LDLCALC, TRIG, CHOLHDL, LDLDIRECT in the last 72 hours. ------------------------------------------------------------------------------------------------------------------  Recent Labs  03/12/15 1000  TSH 1.242   ------------------------------------------------------------------------------------------------------------------ No results for input(s): VITAMINB12, FOLATE, FERRITIN, TIBC, IRON, RETICCTPCT in the last 72 hours.  Coagulation profile  Recent Labs Lab 03/09/15 1018 03/11/15 2013 03/12/15 0523 03/13/15 0300  INR 2.17* 2.95* 1.99* 1.45    No results for input(s): DDIMER in the last 72 hours.  Cardiac Enzymes No results for input(s): CKMB, TROPONINI, MYOGLOBIN in the last 168 hours.  Invalid input(s): CK ------------------------------------------------------------------------------------------------------------------ Invalid input(s): POCBNP  No results for input(s): GLUCAP in the last 72 hours.   [facility] M.D. [facility] 03/13/2015, 1:13 PM  Pager: [phone]   Between 7am to 7pm - call Pager - [phone]  After 7pm go to [email or web address] - password [email or web address]  Call night coverage person covering after 7pm

## 2015-03-13 NOTE — Care Management Note (Addendum)
Case Management Note  Patient Details  Name: [name] MRN: [id number] Date of Birth: 23-Oct-1923  Subjective/Objective:             From home  with wife admitted with hyponatremia.      Action/Plan: Return to home when medically stable.  Expected Discharge Date:  03/14/15               Expected Discharge Plan:  Home/Self Care  In-House Referral:     Discharge planning Services  CM Consult  Post Acute Care Choice:    Choice offered to:     DME Arranged:    DME Agency:     HH Arranged:    HH Agency:     Status of Service:     Medicare Important Message Given:    Date Medicare IM Given:    Medicare IM give by:    Date Additional Medicare IM Given:    Additional Medicare Important Message give by:     If discussed at [place] of Stay Meetings, dates discussed:    Additional Comments:   Lawyer Washabaugh (wife) [phone]  Sharin Mons, RN 03/13/2015, 2:40 PM

## 2015-03-14 ENCOUNTER — Telehealth (HOSPITAL_COMMUNITY): Payer: Self-pay

## 2015-03-14 ENCOUNTER — Encounter (HOSPITAL_COMMUNITY): Payer: Self-pay | Admitting: Gastroenterology

## 2015-03-14 DIAGNOSIS — R531 Weakness: Secondary | ICD-10-CM

## 2015-03-14 DIAGNOSIS — E871 Hypo-osmolality and hyponatremia: Secondary | ICD-10-CM

## 2015-03-14 DIAGNOSIS — S2232XS Fracture of one rib, left side, sequela: Secondary | ICD-10-CM

## 2015-03-14 DIAGNOSIS — R5381 Other malaise: Secondary | ICD-10-CM

## 2015-03-14 LAB — BASIC METABOLIC PANEL
ANION GAP: 9 (ref 5–15)
BUN: 23 mg/dL — ABNORMAL HIGH (ref 6–20)
CHLORIDE: 96 mmol/L — AB (ref 101–111)
CO2: 20 mmol/L — ABNORMAL LOW (ref 22–32)
Calcium: 8.1 mg/dL — ABNORMAL LOW (ref 8.9–10.3)
Creatinine, Ser: 1.18 mg/dL (ref 0.61–1.24)
GFR, EST NON AFRICAN AMERICAN: 53 mL/min — AB (ref >60)
Glucose, Bld: 115 mg/dL — ABNORMAL HIGH (ref 70–99)
POTASSIUM: 3.7 mmol/L (ref 3.5–5.1)
SODIUM: 125 mmol/L — AB (ref 135–145)

## 2015-03-14 LAB — TYPE AND SCREEN
ABO/RH(D): O NEG
Antibody Screen: NEGATIVE
Unit division: 0
Unit division: 0

## 2015-03-14 LAB — PROTIME-INR
INR: 1.94 — ABNORMAL HIGH (ref 0.00–1.49)
Prothrombin Time: 22.3 seconds — ABNORMAL HIGH (ref 11.6–15.2)

## 2015-03-14 LAB — CBC
HEMATOCRIT: 30.8 % — AB (ref 39.0–52.0)
Hemoglobin: 11.2 g/dL — ABNORMAL LOW (ref 13.0–17.0)
MCH: 33.5 pg (ref 26.0–34.0)
MCHC: 36.4 g/dL — ABNORMAL HIGH (ref 30.0–36.0)
MCV: 92.2 fL (ref 78.0–100.0)
Platelets: 236 10*3/uL (ref 150–400)
RBC: 3.34 MIL/uL — ABNORMAL LOW (ref 4.22–5.81)
RDW: 13.9 % (ref 11.5–15.5)
WBC: 12.8 10*3/uL — ABNORMAL HIGH (ref 4.0–10.5)

## 2015-03-14 MED ORDER — PANTOPRAZOLE SODIUM 40 MG PO TBEC
40.0000 mg | DELAYED_RELEASE_TABLET | Freq: Two times a day (BID) | ORAL | Status: DC
  Administered 2015-03-14 – 2015-03-16 (×6): 40 mg via ORAL
  Filled 2015-03-14 (×6): qty 1

## 2015-03-14 MED ORDER — DIGOXIN 0.25 MG/ML IJ SOLN
0.2500 mg | Freq: Four times a day (QID) | INTRAMUSCULAR | Status: AC
  Administered 2015-03-14 – 2015-03-15 (×2): 0.25 mg via INTRAVENOUS
  Filled 2015-03-14 (×2): qty 1

## 2015-03-14 MED ORDER — METOPROLOL SUCCINATE ER 50 MG PO TB24
75.0000 mg | ORAL_TABLET | Freq: Two times a day (BID) | ORAL | Status: DC
  Administered 2015-03-14 – 2015-03-16 (×5): 75 mg via ORAL
  Filled 2015-03-14 (×6): qty 1

## 2015-03-14 NOTE — Progress Notes (Signed)
Subjective:  Patient fell last week and had a large hematoma of his right scalp and later was readmitted with GI bleeding as well as severe hyponatremia that is slowly improving.  He states that he feels much better today.  He continues an atrial tachycardia with block.  Objective:  Vital Signs in the last 24 hours: BP 124/92 mmHg  Pulse 122  Temp(Src) 97.7 F (36.5 C) (Oral)  Resp 20  Ht 5\' 10"  (1.778 m)  Wt 89.9 kg (198 lb 3.1 oz)  BMI 28.44 kg/m2  SpO2 94%  Physical Exam: Elderly male currently in no acute distress sitting up in the chair at the bedside. Head: Old hematoma of right scalp with moderate ecchymoses of face Lungs:  Clear  Cardiac:  Rapid regular rhythm, normal S1 and S2, no S3 Abdomen:  Soft, nontender, no masses Extremities:  No edema present  Intake/Output from previous day: 05/01 0701 - 05/02 0700 In: 1305 [P.O.:460; I.V.:795; IV Piggyback:50] Out: 1200 [Urine:1200] Weight Filed Weights   03/12/15 0420 03/13/15 0318 03/14/15 0500  Weight: 87.6 kg (193 lb 2 oz) 88 kg (194 lb 0.1 oz) 89.9 kg (198 lb 3.1 oz)    Lab Results: Basic Metabolic Panel:  Recent Labs  03/13/15 0300  03/13/15 2034 03/14/15 0815  NA 120*  < > 122* 125*  K 3.9  --   --  3.7  CL 94*  --   --  96*  CO2 18*  --   --  20*  GLUCOSE 88  --   --  115*  BUN 21*  --   --  23*  CREATININE 0.99  --   --  1.18  < > = values in this interval not displayed.  CBC:  Recent Labs  03/11/15 2013  03/12/15 0523  03/13/15 1840 03/14/15 0815  WBC 23.8*  --  19.5*  --   --  12.8*  NEUTROABS 21.2*  --   --   --   --   --   HGB 11.6*  < > 10.8*  < > 11.8* 11.2*  HCT 31.7*  < > 29.6*  < > 32.3* 30.8*  MCV 92.4  --  91.6  --   --  92.2  PLT 188  --  187  --   --  236  < > = values in this interval not displayed.  BNP No results found for: BNP  PROTIME: Lab Results  Component Value Date   INR 1.94* 03/14/2015   INR 1.45 03/13/2015   INR 1.99* 03/12/2015    Telemetry: Atrial  flutter with 2:1 block versus atrial tachycardia  Assessment/Plan:  1.  Atrial tachycardia versus flutter with 2 to one block. 2.  History of anticoagulation currently subtherapeutic 3.  Rheumatoid arthritis 4.  Severe hyponatremia-slowly improving  Recommendations:  His rate appears up and will need to increase dose of his AV nodal blocking agents. As an outpatient his rate has been controlled.  May add a dose or 2 of dig to help.       Kerry Hough  MD [facility]  03/14/2015, 2:11 PM

## 2015-03-14 NOTE — Telephone Encounter (Signed)
Post ED Visit - Positive Culture Follow-up  Culture report reviewed by antimicrobial stewardship pharmacist: []  Wes Dulaney, Pharm.D., BCPS []  Heide Guile, Pharm.D., BCPS []  Alycia Rossetti, Pharm.D., BCPS []  [place], [place].D., BCPS, AAHIVP []  Legrand Como, Pharm.D., BCPS, AAHIVP []  Isac Sarna, Pharm.D., BCPS X  Anette Guarneri, Pharm.D.  Positive Urine culture, >/= 100,000 colonies -> Staph Species Treated with Cephalexin, organism sensitive to the same and no further patient follow-up is required at this time.  Dortha Kern 03/14/2015, 4:14 AM

## 2015-03-14 NOTE — Progress Notes (Signed)
I will follow up with pt and family tomorrow to assist with a possible inpt rehab admission pending bed availability when pt medically ready for d/c. [phone]

## 2015-03-14 NOTE — Progress Notes (Signed)
[facility]                                                                              Patient Demographics  [name], is a 81 y.o. male, DOB - 1923/06/08, ZOX:[id number]  Admit date - 03/11/2015   Admitting Physician Theressa Millard, MD  Outpatient Primary MD for the patient is Merrilee Seashore, MD  LOS - 3   Chief Complaint  Patient presents with  . Leg Pain  . Weakness      HPI on 03/11/2015 by Dr. Jana Hakim [name] is a 81 y.o. male with a history of A.Fib on Coumadin Rx who presents to the ED with complaints of increased weakness in both legs and being unable to walk. He also has increased pain in his right hip since a fall 2 days ago, he was evaluated in the ED and had x-rays which were negative and a ct scan of the head which was also negative for acute findings except for a right frontal scalp hematoma. He was evaluated in the ED and was found to have hyponatremia, he denies any nausea or vomiting or diarrhea, and he is not on any diuretic rx. He also denies any chest pain.Patient had a episode of hematemesis of 200 cc of coffee Ground emesis, and he was re-evaluated, and he denies any ABD pain. He was upgraded to [facility], and placed on a IV Protonix Drip and his coumadin is being reversed.  Assessment & Plan   Hyponatremia -Sodium was 113 at the time of admission, Serum osmolarity 249, urine osmolarity 339 -Currently improving, 125 -Urine sodium 17- repeated found to be 13 - As sodium did not improve significantly with IVF, Nephrology was consulted. Dr Joelyn Oms placed him on a 1.5 L fluid restriction and IVF were discontinued, suspect SIADH vs pain, UTI, or esphagitis -Continue to monitor BMP  Anemia with Upper GI bleed/erosive esophagitis with stricture -Patient initially placed on Protonix prescription, however transitioned to by mouth Protonix -no further gross bleeding, hemoglobin currently stable -GI consulted- EGD done  4/30: distal erosive esophagitis likely cause of hematemesis, distal benign appearing stricture -Initially placed on liquid diet however per GI patient is to continue a pured diet. -advised patient to avoid NSAIDS -Patient will need follow-up with Dr. Michail Sermon in 3 weeks to arrange dilatation  UTI (lower urinary tract infection) -Culture reveals coag neg staph- although asymptomatic, UA grossly positive -started on IV Rocephin- can stop after third dose today -Previous hospitalist discussed case with ID , high chance this is colonization rather than a UTI as he is asymptomatic  A-fib- currently junctional tachycardia? -HR in 100-120 for past 3 days- no P waves noted -Cardiology consulted -Echocardiogram pending -Cardiology recommended increasting metoprolol to 75mg  BID -CHADSVasc 3 -Continue Cardizem and Metoprolol -Coumadin held due to upper GI bleed. INR Improved to 1.45 -GI to advise when to resume Coumadin  Generalized Weakness -PT/OT consulted and recommended CIR -he is willing to go to SNF if it is determined that he needs to -CIR consulted  Left rib fracture- 5th, 6th, 7thSecondary to fall with a scalp hematoma -chest pain is only mild and well controlled with PRN Tylenol  Code Status: DNR  Family Communication: None at bedside  Disposition Plan: Admitted.  Continue to monitor in Step down  Time Spent in minutes   30 minutes  Procedures  EGD  Consults   Cardiology Gastroenterology Nephrology  DVT Prophylaxis  SCDs  Lab Results  Component Value Date   PLT 236 03/14/2015    Medications  Scheduled Meds: . cefTRIAXone (ROCEPHIN)  IV  1 g Intravenous Q24H  . diltiazem  360 mg Oral Daily  . metoprolol succinate  75 mg Oral BID  . multivitamin  1 tablet Oral BID  . omega-3 acid ethyl esters  2 g Oral Daily  . pantoprazole  40 mg Oral BID AC  . sodium chloride  3 mL Intravenous Q12H   Continuous Infusions:  PRN Meds:.acetaminophen **OR**  acetaminophen, alum & mag hydroxide-simeth, HYDROmorphone (DILAUDID) injection, ondansetron **OR** ondansetron (ZOFRAN) IV, oxyCODONE  Antibiotics    Anti-infectives    Start     Dose/Rate Route Frequency Ordered Stop   03/12/15 2115  cefTRIAXone (ROCEPHIN) 1 g in dextrose 5 % 50 mL IVPB     1 g 100 mL/hr over 30 Minutes Intravenous Every 24 hours 03/11/15 2342     03/11/15 2115  cefTRIAXone (ROCEPHIN) 1 g in dextrose 5 % 50 mL IVPB     1 g 100 mL/hr over 30 Minutes Intravenous  Once 03/11/15 2108 03/11/15 2345        Subjective:   [name] seen and examined today.    Patient does state that he has had some problems swallowing and has been on liquids.  Denies chest pain, shortness of breath, abdominal pain, nausea, vomiting, headache. Patient does complain of some left-sided chest pain with coughing.  Objective:   Filed Vitals:   03/13/15 2336 03/14/15 0320 03/14/15 0500 03/14/15 0718  BP: 115/75 109/65  124/92  Pulse: 92 125  122  Temp: 97.9 F (36.6 C) 99.3 F (37.4 C)  99.2 F (37.3 C)  TempSrc: Oral Oral  Oral  Resp: 18 17  20   Height:      Weight:   89.9 kg (198 lb 3.1 oz)   SpO2: 92% 93%  94%    Wt Readings from Last 3 Encounters:  03/14/15 89.9 kg (198 lb 3.1 oz)     Intake/Output Summary (Last 24 hours) at 03/14/15 1109 Last data filed at 03/14/15 0800  Gross per 24 hour  Intake   1570 ml  Output    920 ml  Net    650 ml    Exam  General: Well developed, well nourished, NAD, appears stated age  16: [place], Right orbital ecchymosis, right frontal hematoma, PERRLA, EOMI, Anicteic Sclera, mucous membranes moist.   Cardiovascular: S1 S2 auscultated, tachycardic  Respiratory: Clear to auscultation bilaterally with equal chest rise  Abdomen: Soft, nontender, nondistended, + bowel sounds  Extremities: warm dry without cyanosis clubbing or edema  Neuro: AAOx3, nonfocal  Data Review   Micro Results Recent Results (from the past 240 hour(s))    Urine culture     Status: None   Collection Time: 03/09/15 10:00 AM  Result Value Ref Range Status   Specimen Description URINE, CLEAN CATCH  Final   Special Requests NONE  Final   Colony Count   Final    >=100,000 COLONIES/ML Performed at [facility]    Culture   Final    STAPHYLOCOCCUS SPECIES (COAGULASE NEGATIVE) Note: RIFAMPIN AND GENTAMICIN SHOULD NOT BE USED AS SINGLE DRUGS FOR TREATMENT OF STAPH INFECTIONS. Performed at  [facility]    Report Status 03/12/2015 FINAL  Final   Organism ID, Bacteria STAPHYLOCOCCUS SPECIES (COAGULASE NEGATIVE)  Final      Susceptibility   Staphylococcus species (coagulase negative) - MIC*    GENTAMICIN <=0.5 SENSITIVE Sensitive     LEVOFLOXACIN <=0.12 SENSITIVE Sensitive     NITROFURANTOIN <=16 SENSITIVE Sensitive     OXACILLIN <=0.25 SENSITIVE Sensitive     PENICILLIN >=0.5 RESISTANT Resistant     RIFAMPIN <=0.5 SENSITIVE Sensitive     TRIMETH/SULFA <=10 SENSITIVE Sensitive     VANCOMYCIN <=0.5 SENSITIVE Sensitive     TETRACYCLINE <=1 SENSITIVE Sensitive     * STAPHYLOCOCCUS SPECIES (COAGULASE NEGATIVE)  MRSA PCR Screening     Status: None   Collection Time: 03/12/15  4:32 AM  Result Value Ref Range Status   MRSA by PCR NEGATIVE NEGATIVE Final    Comment:        The GeneXpert MRSA Assay (FDA approved for NASAL specimens only), is one component of a comprehensive MRSA colonization surveillance program. It is not intended to diagnose MRSA infection nor to guide or monitor treatment for MRSA infections.   Culture, blood (routine x 2)     Status: None (Preliminary result)   Collection Time: 03/12/15  9:50 AM  Result Value Ref Range Status   Specimen Description BLOOD RIGHT HAND  Final   Special Requests BOTTLES DRAWN AEROBIC ONLY  3CC  Final   Culture   Final           BLOOD CULTURE RECEIVED NO GROWTH TO DATE CULTURE WILL BE HELD FOR 5 DAYS BEFORE ISSUING A FINAL NEGATIVE REPORT Performed at [facility]     Report Status PENDING  Incomplete  Culture, blood (routine x 2)     Status: None (Preliminary result)   Collection Time: 03/12/15 10:00 AM  Result Value Ref Range Status   Specimen Description BLOOD LEFT HAND  Final   Special Requests BOTTLES DRAWN AEROBIC ONLY  3CC  Final   Culture   Final           BLOOD CULTURE RECEIVED NO GROWTH TO DATE CULTURE WILL BE HELD FOR 5 DAYS BEFORE ISSUING A FINAL NEGATIVE REPORT Performed at [facility]    Report Status PENDING  Incomplete    Radiology Reports Dg Ribs Unilateral W/chest Left  03/09/2015   CLINICAL DATA:  Status post fall today.  Anterior left rib pain.  EXAM: LEFT RIBS AND CHEST - 3+ VIEW  COMPARISON:  Single view of the chest 05/03/2011. CT chest 05/04/2011.  FINDINGS: Subsegmental atelectasis is seen in the left lung base. The lungs are otherwise clear. There is no pneumothorax or pleural effusion. Acute, nondisplaced fractures are seen in the left fifth through seventh ribs. No other fracture is identified.  IMPRESSION: Acute left fifth through seventh rib fractures. Negative for pneumothorax.  Cardiomegaly without edema.  Subsegmental atelectasis left lung base.   Electronically Signed   By: Inge Rise M.D.   On: 03/09/2015 11:13   Ct Head Wo Contrast  03/09/2015   CLINICAL DATA:  Patient became dizzy and fell, hitting head on bed frame. Complaining of head and neck pain  EXAM: CT HEAD WITHOUT CONTRAST  CT CERVICAL SPINE WITHOUT CONTRAST  TECHNIQUE: Multidetector CT imaging of the head and cervical spine was performed following the standard protocol without intravenous contrast. Multiplanar CT image reconstructions of the cervical spine were also generated.  COMPARISON:  None.  FINDINGS: CT HEAD FINDINGS  There is moderate diffuse atrophy. There is no intracranial mass, hemorrhage, extra-axial fluid collection, or midline shift. There is mild small vessel disease in the centra semiovale bilaterally. No acute infarct apparent.  Bony calvarium appears intact. The mastoid air cells are clear. There is a right frontal scalp hematoma. There is mild mucosal thickening in the inferior right maxillary antrum.  CT CERVICAL SPINE FINDINGS  There is no apparent fracture. There is 3 mm of anterolisthesis of C2 on C3. There is 5 mm of anterolisthesis of C3 on C4. There is 1 mm of retrolisthesis of C4 on C5. There is 1 mm of retrolisthesis of C5 on C6. There is 2 mm of anterolisthesis of C7 on T1. These areas of spondylolisthesis are felt to be secondary to underlying spondylosis. The prevertebral soft tissues and predental space regions are normal. There is severe disc space narrowing at C4-5, C5-6, and C6-7. There is moderate narrowing at all other levels. There is facet hypertrophy to varying degrees at all levels bilaterally. There is no frank disc extrusion or stenosis. There is exit foraminal narrowing due to bony hypertrophy at multiple levels, most marked at C5-6 on the left.  IMPRESSION: CT head: Atrophy with mild periventricular small vessel disease. No intracranial mass, hemorrhage, or subdural/epidural hematoma. No acute infarct apparent. Right frontal scalp hematoma.  CT cervical spine: Extensive spondylosis. Mild spondylolisthesis at most levels, felt to be due to spondylosis. No fracture apparent.   Electronically Signed   By: Lowella Grip III M.D.   On: 03/09/2015 10:47   Ct Cervical Spine Wo Contrast  03/09/2015   CLINICAL DATA:  Patient became dizzy and fell, hitting head on bed frame. Complaining of head and neck pain  EXAM: CT HEAD WITHOUT CONTRAST  CT CERVICAL SPINE WITHOUT CONTRAST  TECHNIQUE: Multidetector CT imaging of the head and cervical spine was performed following the standard protocol without intravenous contrast. Multiplanar CT image reconstructions of the cervical spine were also generated.  COMPARISON:  None.  FINDINGS: CT HEAD FINDINGS  There is moderate diffuse atrophy. There is no intracranial mass,  hemorrhage, extra-axial fluid collection, or midline shift. There is mild small vessel disease in the centra semiovale bilaterally. No acute infarct apparent. Bony calvarium appears intact. The mastoid air cells are clear. There is a right frontal scalp hematoma. There is mild mucosal thickening in the inferior right maxillary antrum.  CT CERVICAL SPINE FINDINGS  There is no apparent fracture. There is 3 mm of anterolisthesis of C2 on C3. There is 5 mm of anterolisthesis of C3 on C4. There is 1 mm of retrolisthesis of C4 on C5. There is 1 mm of retrolisthesis of C5 on C6. There is 2 mm of anterolisthesis of C7 on T1. These areas of spondylolisthesis are felt to be secondary to underlying spondylosis. The prevertebral soft tissues and predental space regions are normal. There is severe disc space narrowing at C4-5, C5-6, and C6-7. There is moderate narrowing at all other levels. There is facet hypertrophy to varying degrees at all levels bilaterally. There is no frank disc extrusion or stenosis. There is exit foraminal narrowing due to bony hypertrophy at multiple levels, most marked at C5-6 on the left.  IMPRESSION: CT head: Atrophy with mild periventricular small vessel disease. No intracranial mass, hemorrhage, or subdural/epidural hematoma. No acute infarct apparent. Right frontal scalp hematoma.  CT cervical spine: Extensive spondylosis. Mild spondylolisthesis at most levels, felt to be due to spondylosis. No fracture apparent.   Electronically Signed  By: Lowella Grip III M.D.   On: 03/09/2015 10:47   Dg Hip Unilat With Pelvis 2-3 Views Right  03/11/2015   CLINICAL DATA:  Status post fall. Hit head on bed frame. Right hip pain and weakness. Initial encounter.  EXAM: RIGHT HIP (WITH PELVIS) 2-3 VIEWS  COMPARISON:  None.  FINDINGS: There is no evidence of fracture or dislocation. Both femoral heads are seated normally within their respective acetabula. The proximal right femur appears intact.  Degenerative change is noted at the lower lumbar spine. The sacroiliac joints are unremarkable in appearance.  The visualized bowel gas pattern is grossly unremarkable in appearance. Diffuse vascular calcifications are seen.  IMPRESSION: 1. No evidence of fracture or dislocation. 2. Degenerative change at the lower lumbar spine. 3. Diffuse vascular calcifications seen.   Electronically Signed   By: Garald Balding M.D.   On: 03/11/2015 21:45    CBC  Recent Labs Lab 03/09/15 1018  03/11/15 2013  03/12/15 0523  03/12/15 1710 03/13/15 0125 03/13/15 0840 03/13/15 1840 03/14/15 0815  WBC 11.3*  --  23.8*  --  19.5*  --   --   --   --   --  12.8*  HGB 12.8*  < > 11.6*  < > 10.8*  < > 10.8* 10.2* 10.2* 11.8* 11.2*  HCT 37.0*  < > 31.7*  < > 29.6*  < > 29.9* 28.0* 28.9* 32.3* 30.8*  PLT 210  --  188  --  187  --   --   --   --   --  236  MCV 96.1  --  92.4  --  91.6  --   --   --   --   --  92.2  MCH 33.2  --  33.8  --  33.4  --   --   --   --   --  33.5  MCHC 34.6  --  36.6*  --  36.5*  --   --   --   --   --  36.4*  RDW 14.3  --  13.5  --  13.5  --   --   --   --   --  13.9  LYMPHSABS 0.9  --  0.6*  --   --   --   --   --   --   --   --   MONOABS 1.0  --  1.9*  --   --   --   --   --   --   --   --   EOSABS 0.2  --  0.0  --   --   --   --   --   --   --   --   BASOSABS 0.0  --  0.0  --   --   --   --   --   --   --   --   < > = values in this interval not displayed.  Chemistries   Recent Labs Lab 03/11/15 2013 03/12/15 0523 03/12/15 1200  03/13/15 0300 03/13/15 0333 03/13/15 1840 03/13/15 2034 03/14/15 0815  NA 113* 119* 118*  < > 120* 124* 123* 122* 125*  K 4.3 4.5 4.1  --  3.9  --   --   --  3.7  CL 84* 89* 92*  --  94*  --   --   --  96*  CO2 20 20 16*  --  18*  --   --   --  20*  GLUCOSE 137* 113* 93  --  88  --   --   --  115*  BUN 15 19 22   --  21*  --   --   --  23*  CREATININE 0.91 0.93 0.84  --  0.99  --   --   --  1.18  CALCIUM 8.4 8.2* 7.8*  --  7.8*  --   --   --   8.1*  MG  --   --   --   --   --   --  1.9  --   --   < > = values in this interval not displayed. ------------------------------------------------------------------------------------------------------------------ estimated creatinine clearance is 47.9 mL/min (by C-G formula based on Cr of 1.18). ------------------------------------------------------------------------------------------------------------------ No results for input(s): HGBA1C in the last 72 hours. ------------------------------------------------------------------------------------------------------------------ No results for input(s): CHOL, HDL, LDLCALC, TRIG, CHOLHDL, LDLDIRECT in the last 72 hours. ------------------------------------------------------------------------------------------------------------------  Recent Labs  03/12/15 1000  TSH 1.242   ------------------------------------------------------------------------------------------------------------------ No results for input(s): VITAMINB12, FOLATE, FERRITIN, TIBC, IRON, RETICCTPCT in the last 72 hours.  Coagulation profile  Recent Labs Lab 03/09/15 1018 03/11/15 2013 03/12/15 0523 03/13/15 0300 03/14/15 0242  INR 2.17* 2.95* 1.99* 1.45 1.94*    No results for input(s): DDIMER in the last 72 hours.  Cardiac Enzymes No results for input(s): CKMB, TROPONINI, MYOGLOBIN in the last 168 hours.  Invalid input(s): CK ------------------------------------------------------------------------------------------------------------------ Invalid input(s): POCBNP    Curlie Sittner D.O. on 03/14/2015 at 11:09 AM  Between 7am to 7pm - Pager - [phone]  After 7pm go to [email or web address] - password [email or web address]  And look for the night coverage person covering for me after hours  [facility] Office  [phone]

## 2015-03-14 NOTE — Progress Notes (Signed)
[facility] Rounding Note  Subjective:  Tolerating a liquid diet Going to puree Will need esophageal dilatation of stricture Even with the free water of the liquid diet sodium continues to improve  SODIUM (mmol/L)  Date Value  03/14/2015 125*  03/13/2015 122*  03/13/2015 123*  03/13/2015 124*  03/13/2015 120*  03/13/2015 123*  03/12/2015 119*  03/12/2015 121*  03/12/2015 122*  03/12/2015 118*  ]  05/01 0701 - 05/02 0700 In: 4403 [P.O.:460; I.V.:795; IV Piggyback:50] Out: 1200 [Urine:1200]  Filed Weights   03/12/15 0420 03/13/15 0318 03/14/15 0500  Weight: 87.6 kg (193 lb 2 oz) 88 kg (194 lb 0.1 oz) 89.9 kg (198 lb 3.1 oz)     Physical Exam:  Blood pressure 124/92, pulse 122, temperature 97.7 F (36.5 C), temperature source Oral, resp. rate 20, height 5\' 10"  (1.778 m), weight 89.9 kg (198 lb 3.1 oz), SpO2 94 %. GEN: No acute distress. Lying in hospital bed. ENT: Right periorbital hematoma noticed. EYES: EOMI. CV: Tachycardia, IR/IR. No rub. PULM: CTAB, nl wob ABD: s/nt/nd nabs SKIN: no rashes/lesions EXT:No LEE  Scheduled Meds: . cefTRIAXone (ROCEPHIN)  IV  1 g Intravenous Q24H  . diltiazem  360 mg Oral Daily  . metoprolol succinate  75 mg Oral BID  . multivitamin  1 tablet Oral BID  . omega-3 acid ethyl esters  2 g Oral Daily  . pantoprazole  40 mg Oral BID AC  . sodium chloride  3 mL Intravenous Q12H     PRN Meds:.acetaminophen **OR** acetaminophen, alum & mag hydroxide-simeth, HYDROmorphone (DILAUDID) injection, ondansetron **OR** ondansetron (ZOFRAN) IV, oxyCODONE    Recent Labs Lab 03/12/15 1200  03/13/15 0300  03/13/15 1840 03/13/15 2034 03/14/15 0815  NA 118*  < > 120*  < > 123* 122* 125*  K 4.1  --  3.9  --   --   --  3.7  CL 92*  --  94*  --   --   --  96*  CO2 16*  --  18*  --   --   --  20*  GLUCOSE 93  --  88  --   --   --  115*  BUN 22  --  21*  --   --   --  23*  CREATININE 0.84  --  0.99  --   --   --  1.18  CALCIUM  7.8*  --  7.8*  --   --   --  8.1*  < > = values in this interval not displayed.  Recent Labs Lab 03/09/15 1018  03/11/15 2013  03/12/15 0523  03/13/15 0840 03/13/15 1840 03/14/15 0815  WBC 11.3*  --  23.8*  --  19.5*  --   --   --  12.8*  NEUTROABS 9.3*  --  21.2*  --   --   --   --   --   --   HGB 12.8*  < > 11.6*  < > 10.8*  < > 10.2* 11.8* 11.2*  HCT 37.0*  < > 31.7*  < > 29.6*  < > 28.9* 32.3* 30.8*  MCV 96.1  --  92.4  --  91.6  --   --   --  92.2  PLT 210  --  188  --  187  --   --   --  236  < > = values in this interval not displayed.  Background 68M w/ hypotonic ?euvolemic hyponatremia in setting of UTI, rib  fractures, UGIB 2/2 esophatigis/ulcer A/P 1. Euvolemic Hypotonic Hyponatremia consistent with SIADH - and normally "lives" in the 130-133 range prior to this admission 1. Urine Osms (399) were inappropriate 2. Favor euvolemia over hypovolemia; clearly not hypervolemic 1. Not on diuretics 2. No clear reduction in PO intake preceding admission 3. No N/V/D 3. Likely SIADH 2/2 UTI, Pain, Esophagitis in Elderly male 1. TSH WNL 4. Uric Acid 3.7 5. Would have expected higher U Na... 6. Cont to hold IVFs, 1.5L fluid restriction 7. No indication for Vaptan or hypertonic saline 8. Sodium continues to trend upward - 165 today and certainly out of the danger zone 9. (With patient and family Dr. Joelyn Oms discussed benefit of high solute intake, especially protein) 2. CNS UTI: On Ceftriaxone and was sensitive to oxacillin 3. UGIB 2/2 erosive esophagitis and esophageal ulcer: per GI 4. L Rib Fractures: Provide incentive spirometer 5. AFib with RVR: coumadin reversed, mild RVR this morning  With continued improvement in hyponatremia with fluid restriction only, will sign off.  Please call if there are further issues we can help with.  Jamal Maes, MD [facility] [phone] Pager 03/14/2015, 12:20 PM

## 2015-03-14 NOTE — Evaluation (Signed)
Occupational Therapy Evaluation Patient Details Name: [name] MRN: [id number] DOB: [birth year] Today's Date: 03/14/2015    History of Present Illness Patient is a 81 year old male with A.Fib on Coumadin Rx presented to ED with generalized weakness, and in lower extremities, unable to walk. He was also having increased pain in his right hip since his fall on Wednesday 4/27. UGIB, left rib fxs, right facial brusing   Clinical Impression   This 81 yo male admitted with above presents to acute OT with decreased balance, decreased mobility all affecting his ability to care for himself at home at an independent level as he was pta. He will benefit from acute OT with follow up on CIR to get back to an independent level.    Follow Up Recommendations  CIR    Equipment Recommendations   (TBD next venue)       Precautions / Restrictions Precautions Precautions: Fall (this is the only time he has fallen) Restrictions Weight Bearing Restrictions: No      Mobility Bed Mobility Overal bed mobility: Needs Assistance Bed Mobility: Supine to Sit     Supine to sit: Supervision        Transfers Overall transfer level: Needs assistance Equipment used: Rolling walker (2 wheeled) Transfers: Sit to/from Stand Sit to Stand: Min assist              Balance Overall balance assessment: Needs assistance Sitting-balance support: No upper extremity supported;Feet supported Sitting balance-Leahy Scale: Fair     Standing balance support: Bilateral upper extremity supported Standing balance-Leahy Scale: Poor Standing balance comment: posterior lean intially on sit>stand                            ADL Overall ADL's : Needs assistance/impaired Eating/Feeding: Independent;Sitting   Grooming: Min guard;Standing   Upper Body Bathing: Set up;Sitting   Lower Body Bathing: Moderate assistance (with min A sit<>stand)   Upper Body Dressing : Set up;Sitting   Lower Body  Dressing: Maximal assistance (with min A sit<>stand)   Toilet Transfer: Minimal assistance;Ambulation;RW (bed>75 feet>recliner)   Toileting- Clothing Manipulation and Hygiene: Moderate assistance (with min A sit<>stand)               Vision Additional Comments: No change from baseline          Pertinent Vitals/Pain Pain Assessment: No/denies pain     Hand Dominance Right   Extremity/Trunk Assessment Upper Extremity Assessment Upper Extremity Assessment: Overall WFL for tasks assessed           Communication Communication Communication: HOH   Cognition Arousal/Alertness: Awake/alert Behavior During Therapy: WFL for tasks assessed/performed Overall Cognitive Status: Within Functional Limits for tasks assessed                                Home Living Family/patient expects to be discharged to:: Inpatient rehab Living Arrangements: Spouse/significant other Available Help at Discharge: Family;Available 24 hours/day Type of Home: House Home Access: Stairs to enter [employer] of Steps: 5 Entrance Stairs-Rails: Right Home Layout: One level               Home Equipment: None          Prior Functioning/Environment Level of Independence: Independent             OT Diagnosis: Generalized weakness   OT Problem List: Decreased strength;Decreased range of motion;Impaired balance (  sitting and/or standing);Decreased knowledge of use of DME or AE   OT Treatment/Interventions: Self-care/ADL training;Patient/family education;Balance training;DME and/or AE instruction    OT Goals(Current goals can be found in the care plan section) Acute Rehab OT Goals Patient Stated Goal: preferably to go home OT Goal Formulation: With patient Time For Goal Achievement: 03/21/15 Potential to Achieve Goals: Good  OT Frequency: Min 2X/week              End of Session Equipment Utilized During Treatment: Gait belt;Rolling walker  Activity  Tolerance: Patient tolerated treatment well Patient left: in chair;with call bell/phone within reach   Time: 7001-7494 OT Time Calculation (min): 30 min Charges:  OT General Charges $OT Visit: 1 Procedure OT Evaluation $Initial OT Evaluation Tier I: 1 Procedure OT Treatments $Self Care/Home Management : 8-22 mins  Almon Register [phone] 03/14/2015, 3:17 PM

## 2015-03-14 NOTE — Consult Note (Signed)
Physical Medicine and Rehabilitation Consult   Reason for Consult:  Deconditioning Referring Physician:  Dr.  Ree Kida   HPI: [name] is a 81 y.o. male with h/o afib/aflutter--chronic coumadin, rheumatoid arthritis, recent fall with rib fracture on 04/27; who was admitted on 03/12/15 with generalized weakness and difficulty walking. He was found have  profoundly hyponatremia with Na- 113 and developed dark red and coffee ground emesis in ED. He was evaluated by nephrology who felt that SIADH likely due to UTI, pain esophagitis and recommended 1.5 L fluid restriction.   stop IVF. Patient underwent EGD the same day moderate to severe distal erosive esophagitis and a moderately obstructing ulcerated distal esophageal stricture. Dr. Michail Sermon recommended PPI and patient started on clears 05/01 with advancement to pureed foods today.  Patient will require dilatation on outpatient basisl.    Review of Systems  HENT: Negative for hearing loss.   Eyes: Negative for blurred vision and double vision.  Respiratory: Negative for cough and shortness of breath.   Cardiovascular: Negative for chest pain and palpitations.  Genitourinary: Positive for urgency and frequency (gets up frequently at nights).  Musculoskeletal: Positive for myalgias, back pain, joint pain and neck pain.  Neurological: Negative for dizziness, tingling and headaches.  Psychiatric/Behavioral: The patient is not nervous/anxious.    Past Medical History  Diagnosis Date  . A-fib   . RA (rheumatoid arthritis)    Past Surgical History  Procedure Laterality Date  . Cataract extraction    . Esophagogastroduodenoscopy N/A 03/12/2015    Procedure: ESOPHAGOGASTRODUODENOSCOPY (EGD);  Surgeon: Wilford Corner, MD;  Location: [facility] ENDOSCOPY;  Service: Endoscopy;  Laterality: N/A;    No family history on file.    Social History:  reports that he has been smoking Cigars.  He has never used smokeless tobacco. He reports that he  does not drink alcohol or use illicit drugs.    Allergies  Allergen Reactions  . Shellfish Allergy Anaphylaxis  . Sulfa Antibiotics Anaphylaxis    Medications Prior to Admission  Medication Sig Dispense Refill  . cephALEXin (KEFLEX) 500 MG capsule Take 1 capsule (500 mg total) by mouth 3 (three) times daily. (Patient taking differently: Take 500 mg by mouth 3 (three) times daily. Started on 03-09-15) 30 capsule 0  . diltiazem (TIAZAC) 360 MG 24 hr capsule Take 360 mg by mouth daily.  0  . metoprolol succinate (TOPROL-XL) 50 MG 24 hr tablet Take 50 mg by mouth 2 (two) times daily.  1  . Multiple Vitamins-Minerals (PRESERVISION AREDS) CAPS Take 1 capsule by mouth 2 (two) times daily.    . Omega-3 Fatty Acids (FISH OIL) 1000 MG CAPS Take 2,000 capsules by mouth daily.    Marland Kitchen warfarin (COUMADIN) 2 MG tablet Take 2-4 mg by mouth See admin instructions. 2mg  everyday except for thursdays - takes 4mg  on thursdays  0    Home: [place] expects to be discharged to:: Private residence Living Arrangements: Spouse/significant other Available Help at Discharge: Family, Available 24 hours/day Type of Home: House Home Access: Stairs to enter [employer] of Steps: 5 Entrance Stairs-Rails: Right Home Layout: One level Home Equipment: None  Functional History: Prior Function Level of Independence: Independent Functional Status:  Mobility: Bed Mobility Overal bed mobility: Needs Assistance Bed Mobility: Rolling, Sidelying to Sit Rolling: Min guard Sidelying to sit: Mod assist General bed mobility comments: good use of rail to roll; mod assist to push up to sit; used bed pad to square off hips  Transfers Overall transfer level: Needs assistance Equipment used: Rolling walker (2 wheeled) Transfers: Sit to/from Stand Sit to Stand: Mod assist General transfer comment: Mod assist to power up and noted dependent on momentum; cues for proper hand placement; mod assisst to  control descent to sit Ambulation/Gait Ambulation/Gait assistance: +2 safety/equipment, Mod assist, Min assist Ambulation Distance (Feet): 30 Feet Assistive device: Rolling walker (2 wheeled) General Gait Details: Cues for upright posture and RW proximity; second person helpful for chair behind; noted decr stance stability RLE Gait Pattern/deviations: Shuffle, Decreased step length - right, Decreased step length - left, Trunk flexed Gait velocity: quite slow    ADL:    Cognition: Cognition Overall Cognitive Status: Within Functional Limits for tasks assessed Orientation Level: Oriented X4 Cognition Arousal/Alertness: Awake/alert Behavior During Therapy: WFL for tasks assessed/performed Overall Cognitive Status: Within Functional Limits for tasks assessed  Blood pressure 124/92, pulse 122, temperature 97.7 F (36.5 C), temperature source Oral, resp. rate 20, height 5\' 10"  (1.778 m), weight 89.9 kg (198 lb 3.1 oz), SpO2 94 %. Physical Exam  Nursing note and vitals reviewed. Constitutional: He is oriented to person, place, and time. He appears well-developed and well-nourished.  HENT:  Head: Normocephalic and atraumatic.  Diffuse bruising left face--  from left forehead to chin.   Eyes: Pupils are equal, round, and reactive to light. Right conjunctiva has a hemorrhage.  Neck: Normal range of motion.  Cardiovascular: An irregularly irregular rhythm present. Tachycardia present.   Heart rate 120's at rest and with activity.   Respiratory: Effort normal. No respiratory distress. He has no wheezes.  GI: Soft. He exhibits distension. Bowel sounds are decreased. There is no tenderness.  Musculoskeletal: He exhibits edema and tenderness.  Tends to keep LLE rotated outward. Decreased ROM bilateral knees and left ankle. RA deformities left hand.   Neurological: He is alert and oriented to person, place, and time.  Mild dysarthria.  Follows commands without difficulty. Moves all 4's. More  difficulty moving RUE than left. Pain with shoulder girdle/back movements.   Skin: Skin is warm and dry.  Multiple bruises along right face/rue/right chest  Psychiatric: He has a normal mood and affect. His behavior is normal.    Results for orders placed or performed during the hospital encounter of 03/11/15 (from the past 24 hour(s))  Hemoglobin and hematocrit, blood     Status: Abnormal   Collection Time: 03/13/15  6:40 PM  Result Value Ref Range   Hemoglobin 11.8 (L) 13.0 - 17.0 g/dL   HCT 32.3 (L) 39.0 - 52.0 %  Sodium     Status: Abnormal   Collection Time: 03/13/15  6:40 PM  Result Value Ref Range   Sodium 123 (L) 135 - 145 mmol/L  Magnesium     Status: None   Collection Time: 03/13/15  6:40 PM  Result Value Ref Range   Magnesium 1.9 1.7 - 2.4 mg/dL  Sodium     Status: Abnormal   Collection Time: 03/13/15  8:34 PM  Result Value Ref Range   Sodium 122 (L) 135 - 145 mmol/L  Protime-INR     Status: Abnormal   Collection Time: 03/14/15  2:42 AM  Result Value Ref Range   Prothrombin Time 22.3 (H) 11.6 - 15.2 seconds   INR 1.94 (H) 0.00 - 1.49  CBC     Status: Abnormal   Collection Time: 03/14/15  8:15 AM  Result Value Ref Range   WBC 12.8 (H) 4.0 - 10.5 K/uL   RBC 3.34 (  L) 4.22 - 5.81 MIL/uL   Hemoglobin 11.2 (L) 13.0 - 17.0 g/dL   HCT 30.8 (L) 39.0 - 52.0 %   MCV 92.2 78.0 - 100.0 fL   MCH 33.5 26.0 - 34.0 pg   MCHC 36.4 (H) 30.0 - 36.0 g/dL   RDW 13.9 11.5 - 15.5 %   Platelets 236 150 - 400 K/uL  Basic metabolic panel     Status: Abnormal   Collection Time: 03/14/15  8:15 AM  Result Value Ref Range   Sodium 125 (L) 135 - 145 mmol/L   Potassium 3.7 3.5 - 5.1 mmol/L   Chloride 96 (L) 101 - 111 mmol/L   CO2 20 (L) 22 - 32 mmol/L   Glucose, Bld 115 (H) 70 - 99 mg/dL   BUN 23 (H) 6 - 20 mg/dL   Creatinine, Ser 1.18 0.61 - 1.24 mg/dL   Calcium 8.1 (L) 8.9 - 10.3 mg/dL   GFR calc non Af Amer 53 (L) >60 mL/min   GFR calc Af Amer >60 >60 mL/min   Anion gap 9 5 - 15    No results found.  Assessment/Plan: Diagnosis: multiple rib fractures/contusions after fall, further debility due to hyponatremia and multiple medical 1. Does the need for close, 24 hr/day medical supervision in concert with the patient's rehab needs make it unreasonable for this patient to be served in a less intensive setting? Yes 2. Co-Morbidities requiring supervision/potential complications: esophagitis, pain control, afib 3. Due to bladder management, bowel management, safety, skin/wound care, disease management, medication administration, pain management and patient education, does the patient require 24 hr/day rehab nursing? Yes 4. Does the patient require coordinated care of a physician, rehab nurse, PT (1-2 hrs/day, 5 days/week), OT (1-2 hrs/day, 5 days/week) and SLP (1-2 hrs/day, 5 days/week) to address physical and functional deficits in the context of the above medical diagnosis(es)? Yes Addressing deficits in the following areas: balance, endurance, locomotion, strength, transferring, bowel/bladder control, bathing, dressing, feeding, grooming, toileting, cognition, speech, swallowing and psychosocial support 5. Can the patient actively participate in an intensive therapy program of at least 3 hrs of therapy per day at least 5 days per week? Yes 6. The potential for patient to make measurable gains while on inpatient rehab is excellent 7. Anticipated functional outcomes upon discharge from inpatient rehab are modified independent  with PT, modified independent with OT, modified independent and supervision with SLP. 8. Estimated rehab length of stay to reach the above functional goals is: 8-15 days 9. Does the patient have adequate social supports and living environment to accommodate these discharge functional goals? Yes 10. Anticipated D/C setting: Home 11. Anticipated post D/C treatments: HH therapy and Outpatient therapy 12. Overall Rehab/Functional Prognosis:  excellent  RECOMMENDATIONS: This patient's condition is appropriate for continued rehabilitative care in the following setting: potentially CIR pending medical issues Patient has agreed to participate in recommended program. Yes and Potentially Note that insurance prior authorization may be required for reimbursement for recommended care.  Comment: Rehab Admissions Coordinator to follow up.  Thanks,  Meredith Staggers, MD, Mellody Drown     03/14/2015

## 2015-03-14 NOTE — Progress Notes (Signed)
Rehab Admissions Coordinator Note:  Patient was screened by [place] L for appropriateness for an Inpatient Acute Rehab Consult.  At this time, we are recommending Inpatient Rehab consult.  [name] L 03/14/2015, 10:27 AM  I can be reached at [phone].

## 2015-03-14 NOTE — Progress Notes (Signed)
[facility] PROGRESS NOTE Subjective Pt tolerating FL diet w/o problems  Objective: Vital signs in last 24 hours: Temp:  [97.7 F (36.5 C)-99.3 F (37.4 C)] 99.2 F (37.3 C) (05/02 0718) Pulse Rate:  [74-125] 122 (05/02 0718) Resp:  [17-24] 20 (05/02 0718) BP: (102-124)/(56-92) 124/92 mmHg (05/02 0718) SpO2:  [92 %-95 %] 94 % (05/02 0718) Weight:  [89.9 kg (198 lb 3.1 oz)] 89.9 kg (198 lb 3.1 oz) (05/02 0500)    Intake/Output from previous day: 05/01 0701 - 05/02 0700 In: 1280 [P.O.:460; I.V.:770; IV Piggyback:50] Out: 1200 [Urine:1200] Intake/Output this shift:    PE: General--NAD  Abdomen--nontender.  Lab Results:  Recent Labs  03/11/15 2013  03/12/15 0523 03/12/15 1000 03/12/15 1710 03/13/15 0125 03/13/15 0840 03/13/15 1840  WBC 23.8*  --  19.5*  --   --   --   --   --   HGB 11.6*  < > 10.8* 10.7* 10.8* 10.2* 10.2* 11.8*  HCT 31.7*  < > 29.6* 29.4* 29.9* 28.0* 28.9* 32.3*  PLT 188  --  187  --   --   --   --   --   < > = values in this interval not displayed. BMET  Recent Labs  03/11/15 2013 03/12/15 0523 03/12/15 1200  03/13/15 0125 03/13/15 0300 03/13/15 0333 03/13/15 1840 03/13/15 2034  NA 113* 119* 118*  < > 123* 120* 124* 123* 122*  K 4.3 4.5 4.1  --   --  3.9  --   --   --   CL 84* 89* 92*  --   --  94*  --   --   --   CO2 20 20 16*  --   --  18*  --   --   --   CREATININE 0.91 0.93 0.84  --   --  0.99  --   --   --   < > = values in this interval not displayed. LFT No results for input(s): PROT, AST, ALT, ALKPHOS, BILITOT, BILIDIR, IBILI in the last 72 hours. PT/INR  Recent Labs  03/12/15 0523 03/13/15 0300 03/14/15 0242  LABPROT 22.8* 17.8* 22.3*  INR 1.99* 1.45 1.94*   PANCREAS No results for input(s): LIPASE in the last 72 hours.       Studies/Results: No results found.  Medications: I have reviewed the patient's current medications.  Assessment/Plan: 1. Erosive Esophagitis with stricture. Tolerating FLs,  Long discussion with him (15 min) about consistency of food i.e. Using blender, pureeing solids etc. Would go ahead and change to PO protonix  Pureed diet and have him follow up with Dr Michail Sermon in about 3 weeks to arrange dilatation.   Iceis Knab JR,Janal Haak L 03/14/2015, 8:34 AM

## 2015-03-15 ENCOUNTER — Ambulatory Visit (HOSPITAL_COMMUNITY): Payer: Medicare Other

## 2015-03-15 LAB — BASIC METABOLIC PANEL
Anion gap: 10 (ref 5–15)
BUN: 29 mg/dL — ABNORMAL HIGH (ref 6–20)
CALCIUM: 8.4 mg/dL — AB (ref 8.9–10.3)
CO2: 19 mmol/L — ABNORMAL LOW (ref 22–32)
Chloride: 95 mmol/L — ABNORMAL LOW (ref 101–111)
Creatinine, Ser: 1.21 mg/dL (ref 0.61–1.24)
GFR calc Af Amer: 59 mL/min — ABNORMAL LOW (ref >60)
GFR calc non Af Amer: 51 mL/min — ABNORMAL LOW (ref >60)
GLUCOSE: 119 mg/dL — AB (ref 70–99)
POTASSIUM: 4.4 mmol/L (ref 3.5–5.1)
Sodium: 124 mmol/L — ABNORMAL LOW (ref 135–145)

## 2015-03-15 LAB — CBC
HCT: 31.6 % — ABNORMAL LOW (ref 39.0–52.0)
Hemoglobin: 11.3 g/dL — ABNORMAL LOW (ref 13.0–17.0)
MCH: 33.1 pg (ref 26.0–34.0)
MCHC: 35.8 g/dL (ref 30.0–36.0)
MCV: 92.7 fL (ref 78.0–100.0)
Platelets: 261 10*3/uL (ref 150–400)
RBC: 3.41 MIL/uL — ABNORMAL LOW (ref 4.22–5.81)
RDW: 14.1 % (ref 11.5–15.5)
WBC: 10.4 10*3/uL (ref 4.0–10.5)

## 2015-03-15 LAB — PROTIME-INR
INR: 1.83 — ABNORMAL HIGH (ref 0.00–1.49)
PROTHROMBIN TIME: 21.3 s — AB (ref 11.6–15.2)

## 2015-03-15 NOTE — Care Management Note (Signed)
Case Management Note  Patient Details  Name: Wesson Stith MRN: [id number] Date of Birth: 01-03-1923  Subjective/Objective:                    Action/Plan:   Expected Discharge Date:  03/14/15               Expected Discharge Plan:  Home/Self Care  In-House Referral:     Discharge planning Services  CM Consult  Post Acute Care Choice:    Choice offered to:     DME Arranged:    DME Agency:     HH Arranged:    [place] Agency:     Status of Service:     Medicare Important Message Given:    Date Medicare IM Given:    Medicare IM give by:    Date Additional Medicare IM Given:    Additional Medicare Important Message give by:     If discussed at [place] of Stay Meetings, dates discussed:    Additional Comments:  CM called to made referral to [facility] @ [phone], voicemail left, awaiting call back.  Whitman Hero [place], RN 03/15/2015, 11:32 AM

## 2015-03-15 NOTE — Progress Notes (Signed)
Pt transferred to [id number] per order via wheelchair, report called to [place], Therapist, sports. Central tele notified of pt transfer. Pt family also aware of room change. Vitals stable at time of transfer, all belongings sent with pt to [facility]

## 2015-03-15 NOTE — Progress Notes (Signed)
Subjective:  Trying to figure out whether he will go to rehabilitation will go home.  No shortness of breath or chest pain.  With the addition of digoxin yesterday, heart rate much improved.  Objective:  Vital Signs in the last 24 hours: BP 124/63 mmHg  Pulse 61  Temp(Src) 97.8 F (36.6 C) (Oral)  Resp 17  Ht 5\' 10"  (1.778 m)  Wt 89.9 kg (198 lb 3.1 oz)  BMI 28.44 kg/m2  SpO2 95%  Physical Exam: Pleasant elderly male sitting at bedside in chair in no acute distress Head: Scalp hematoma old with ecchymoses on right side of face Lungs:  Clear  Cardiac:  irregular rhythm, normal S1 and S2, no S3 Abdomen:  Soft, nontender, no masses Extremities:  No edema present  Intake/Output from previous day: 05/02 0701 - 05/03 0700 In: 625 [P.O.:600; I.V.:25] Out: 570 [Urine:570] Weight Filed Weights   03/12/15 0420 03/13/15 0318 03/14/15 0500  Weight: 87.6 kg (193 lb 2 oz) 88 kg (194 lb 0.1 oz) 89.9 kg (198 lb 3.1 oz)    Lab Results: Basic Metabolic Panel:  Recent Labs  03/14/15 0815 03/15/15 0333  NA 125* 124*  K 3.7 4.4  CL 96* 95*  CO2 20* 19*  GLUCOSE 115* 119*  BUN 23* 29*  CREATININE 1.18 1.21    CBC:  Recent Labs  03/14/15 0815 03/15/15 0333  WBC 12.8* 10.4  HGB 11.2* 11.3*  HCT 30.8* 31.6*  MCV 92.2 92.7  PLT 236 261    BNP No results found for: BNP  PROTIME: Lab Results  Component Value Date   INR 1.83* 03/15/2015   INR 1.94* 03/14/2015   INR 1.45 03/13/2015    Telemetry: Tachycardia with block with slow her rhythm since yesterday currently running around 70-80 BPM  Assessment/Plan:  1. Atrial tachycardia versus flutter with improvement in heart rate and now controlled  2. History of anticoagulation currently subtherapeutic 3. Rheumatoid arthritis 4. Severe hyponatremia-slowly improving  Recommendations:   at this point with heart rate improved and over the acute phase of his illness would continue his metoprolol and diltiazem without  further Lanoxin.  If rate increases in the future could try a low dose of Lanoxin to help with rate control.  Should resume anticoagulation to reduce stroke risk when he is stable from a GI viewpoint.   Kerry Hough  MD [facility]  03/15/2015, 1:23 PM

## 2015-03-15 NOTE — Progress Notes (Signed)
I met with pt, his wife, and son at bedside. We discussed a possible inpt rehab bed pending bed availability over the next few days. I did discuss with Dr. Ree Kida that no bed is available today. Plans are for pt to transfer out of unit today. Family is open to [facility] rehab if no bed is available here and are aware that SNF would also be an option. I will discuss with RN CM and SW. Pt reports the need to have a BM. Son is to be primary contact after any discussions with pt himself. [phone]

## 2015-03-15 NOTE — Progress Notes (Signed)
  Echocardiogram 2D Echocardiogram has been performed.  Jhoselyn Ruffini FRANCES 03/15/2015, 3:24 PM

## 2015-03-15 NOTE — Progress Notes (Signed)
[facility]                                                                              Patient Demographics  [name], is a 81 y.o. male, DOB - 01/21/1923, UXN:[id number]  Admit date - 03/11/2015   Admitting Physician Theressa Millard, MD  Outpatient Primary MD for the patient is Merrilee Seashore, MD  LOS - 4   Chief Complaint  Patient presents with  . Leg Pain  . Weakness      HPI on 03/11/2015 by Dr. Jana Hakim Brenan [name] is a 81 y.o. male with a history of A.Fib on Coumadin Rx who presents to the ED with complaints of increased weakness in both legs and being unable to walk. He also has increased pain in his right hip since a fall 2 days ago, he was evaluated in the ED and had x-rays which were negative and a ct scan of the head which was also negative for acute findings except for a right frontal scalp hematoma. He was evaluated in the ED and was found to have hyponatremia, he denies any nausea or vomiting or diarrhea, and he is not on any diuretic rx. He also denies any chest pain.Patient had a episode of hematemesis of 200 cc of coffee Ground emesis, and he was re-evaluated, and he denies any ABD pain. He was upgraded to [facility], and placed on a IV Protonix Drip and his coumadin is being reversed.  Assessment & Plan   Hyponatremia -Sodium was 113 at the time of admission, Serum osmolarity 249, urine osmolarity 339 -Currently improving, 124 -Urine sodium 17- repeated found to be 13 (03/13/2015) -As sodium did not improve significantly with IV -Nephrology was consulted and appreciated.  Recommended 1.5 fluid restriction and IVF were discontinued, suspect SIADH vs pain, UTI, or esphagitis -Continue to monitor BMP  Anemia with Upper GI bleed/erosive esophagitis with stricture -Patient initially placed on Protonix prescription, however transitioned to by mouth Protonix -no further gross bleeding, hemoglobin currently stable -GI consulted- EGD  done 4/30: distal erosive esophagitis likely cause of hematemesis, distal benign appearing stricture -Initially placed on liquid diet however per GI patient is to continue a pured diet. -advised patient to avoid NSAIDS -Patient will need follow-up with Dr. Michail Sermon in 3 weeks to arrange dilatation  UTI (lower urinary tract infection) -Culture reveals coag neg staph- although asymptomatic, UA grossly positive -started on IV Rocephin- and completed course -Previous hospitalist discussed case with ID, high chance this is colonization rather than a UTI as he is asymptomatic  A-fib- currently junctional tachycardia? -HR better controlled -Cardiology consulted and appreciated.  Dr Wynonia Lawman ordered digoxin -Cardiology recommended increasting metoprolol to 75mg  BID -CHADSVasc 3 -Continue Cardizem and Metoprolol -Coumadin held due to upper GI bleed. INR Improved to 1.45 -GI to advise when to resume Coumadin- spoke with Dr. Oletta Lamas who recommended holding coumadin during hospitalization but may resume at discharge  Generalized Weakness -PT/OT consulted and recommended CIR -he is willing to go to SNF if CIR is able to accept -CIR consulted -Social work consulted  Left rib fracture- 5th, 6th, 7thSecondary to fall with a scalp hematoma -chest pain is only mild and well controlled with  PRN Tylenol  Code Status: DNR  Family Communication: None at bedside  Disposition Plan: Admitted.  Will transfer patient to tele. Pending CIR.   Time Spent in minutes   30 minutes  Procedures  EGD  Consults   Cardiology Gastroenterology Nephrology  DVT Prophylaxis  SCDs  Lab Results  Component Value Date   PLT 261 03/15/2015    Medications  Scheduled Meds: . cefTRIAXone (ROCEPHIN)  IV  1 g Intravenous Q24H  . diltiazem  360 mg Oral Daily  . metoprolol succinate  75 mg Oral BID  . multivitamin  1 tablet Oral BID  . omega-3 acid ethyl esters  2 g Oral Daily  . pantoprazole  40 mg Oral BID  AC  . sodium chloride  3 mL Intravenous Q12H   Continuous Infusions:  PRN Meds:.acetaminophen **OR** acetaminophen, alum & mag hydroxide-simeth, HYDROmorphone (DILAUDID) injection, ondansetron **OR** ondansetron (ZOFRAN) IV, oxyCODONE  Antibiotics    Anti-infectives    Start     Dose/Rate Route Frequency Ordered Stop   03/12/15 2115  cefTRIAXone (ROCEPHIN) 1 g in dextrose 5 % 50 mL IVPB     1 g 100 mL/hr over 30 Minutes Intravenous Every 24 hours 03/11/15 2342     03/11/15 2115  cefTRIAXone (ROCEPHIN) 1 g in dextrose 5 % 50 mL IVPB     1 g 100 mL/hr over 30 Minutes Intravenous  Once 03/11/15 2108 03/11/15 2345        Subjective:   [name] seen and examined today.    Patient states he is worried about his heart. He states that when he gets up his heart rate goes up. Patient denies any chest pain or shortness of breath at this time. He denies any abdominal pain, nausea or vomiting.  Objective:   Filed Vitals:   03/14/15 2254 03/14/15 2255 03/15/15 0300 03/15/15 0745  BP:  105/66 122/79 124/63  Pulse:  62 96 61  Temp: 97.6 F (36.4 C)  98.4 F (36.9 C) 98.3 F (36.8 C)  TempSrc: Oral  Oral Oral  Resp:  21 17   Height:      Weight:      SpO2:  94% 94% 95%    Wt Readings from Last 3 Encounters:  03/14/15 89.9 kg (198 lb 3.1 oz)     Intake/Output Summary (Last 24 hours) at 03/15/15 1139 Last data filed at 03/15/15 0807  Gross per 24 hour  Intake    360 ml  Output    575 ml  Net   -215 ml    Exam  General: Well developed, well nourished, no distress  HEENT: [place], Right orbital ecchymosis, right frontal hematoma, mucous membranes moist.   Cardiovascular: S1 S2 auscultated, regular  Respiratory: Clear to auscultation   Abdomen: Soft, nontender, nondistended, + bowel sounds  Extremities: warm dry without cyanosis clubbing or edema  Neuro: AAOx3, nonfocal  Data Review   Micro Results Recent Results (from the past 240 hour(s))  Urine culture      Status: None   Collection Time: 03/09/15 10:00 AM  Result Value Ref Range Status   Specimen Description URINE, CLEAN CATCH  Final   Special Requests NONE  Final   Colony Count   Final    >=100,000 COLONIES/ML Performed at [place]   Final    STAPHYLOCOCCUS SPECIES (COAGULASE NEGATIVE) Note: RIFAMPIN AND GENTAMICIN SHOULD NOT BE USED AS SINGLE DRUGS FOR TREATMENT OF STAPH INFECTIONS. Performed at [facility]  Report Status 03/12/2015 FINAL  Final   Organism ID, Bacteria STAPHYLOCOCCUS SPECIES (COAGULASE NEGATIVE)  Final      Susceptibility   Staphylococcus species (coagulase negative) - MIC*    GENTAMICIN <=0.5 SENSITIVE Sensitive     LEVOFLOXACIN <=0.12 SENSITIVE Sensitive     NITROFURANTOIN <=16 SENSITIVE Sensitive     OXACILLIN <=0.25 SENSITIVE Sensitive     PENICILLIN >=0.5 RESISTANT Resistant     RIFAMPIN <=0.5 SENSITIVE Sensitive     TRIMETH/SULFA <=10 SENSITIVE Sensitive     VANCOMYCIN <=0.5 SENSITIVE Sensitive     TETRACYCLINE <=1 SENSITIVE Sensitive     * STAPHYLOCOCCUS SPECIES (COAGULASE NEGATIVE)  MRSA PCR Screening     Status: None   Collection Time: 03/12/15  4:32 AM  Result Value Ref Range Status   MRSA by PCR NEGATIVE NEGATIVE Final    Comment:        The GeneXpert MRSA Assay (FDA approved for NASAL specimens only), is one component of a comprehensive MRSA colonization surveillance program. It is not intended to diagnose MRSA infection nor to guide or monitor treatment for MRSA infections.   Culture, blood (routine x 2)     Status: None (Preliminary result)   Collection Time: 03/12/15  9:50 AM  Result Value Ref Range Status   Specimen Description BLOOD RIGHT HAND  Final   Special Requests BOTTLES DRAWN AEROBIC ONLY  3CC  Final   Culture   Final           BLOOD CULTURE RECEIVED NO GROWTH TO DATE CULTURE WILL BE HELD FOR 5 DAYS BEFORE ISSUING A FINAL NEGATIVE REPORT Performed at [facility]    Report Status  PENDING  Incomplete  Culture, blood (routine x 2)     Status: None (Preliminary result)   Collection Time: 03/12/15 10:00 AM  Result Value Ref Range Status   Specimen Description BLOOD LEFT HAND  Final   Special Requests BOTTLES DRAWN AEROBIC ONLY  3CC  Final   Culture   Final           BLOOD CULTURE RECEIVED NO GROWTH TO DATE CULTURE WILL BE HELD FOR 5 DAYS BEFORE ISSUING A FINAL NEGATIVE REPORT Performed at [facility]    Report Status PENDING  Incomplete    Radiology Reports Dg Ribs Unilateral W/chest Left  03/09/2015   CLINICAL DATA:  Status post fall today.  Anterior left rib pain.  EXAM: LEFT RIBS AND CHEST - 3+ VIEW  COMPARISON:  Single view of the chest 05/03/2011. CT chest 05/04/2011.  FINDINGS: Subsegmental atelectasis is seen in the left lung base. The lungs are otherwise clear. There is no pneumothorax or pleural effusion. Acute, nondisplaced fractures are seen in the left fifth through seventh ribs. No other fracture is identified.  IMPRESSION: Acute left fifth through seventh rib fractures. Negative for pneumothorax.  Cardiomegaly without edema.  Subsegmental atelectasis left lung base.   Electronically Signed   By: Inge Rise M.D.   On: 03/09/2015 11:13   Ct Head Wo Contrast  03/09/2015   CLINICAL DATA:  Patient became dizzy and fell, hitting head on bed frame. Complaining of head and neck pain  EXAM: CT HEAD WITHOUT CONTRAST  CT CERVICAL SPINE WITHOUT CONTRAST  TECHNIQUE: Multidetector CT imaging of the head and cervical spine was performed following the standard protocol without intravenous contrast. Multiplanar CT image reconstructions of the cervical spine were also generated.  COMPARISON:  None.  FINDINGS: CT HEAD FINDINGS  There is moderate diffuse atrophy.  There is no intracranial mass, hemorrhage, extra-axial fluid collection, or midline shift. There is mild small vessel disease in the centra semiovale bilaterally. No acute infarct apparent. Bony calvarium  appears intact. The mastoid air cells are clear. There is a right frontal scalp hematoma. There is mild mucosal thickening in the inferior right maxillary antrum.  CT CERVICAL SPINE FINDINGS  There is no apparent fracture. There is 3 mm of anterolisthesis of C2 on C3. There is 5 mm of anterolisthesis of C3 on C4. There is 1 mm of retrolisthesis of C4 on C5. There is 1 mm of retrolisthesis of C5 on C6. There is 2 mm of anterolisthesis of C7 on T1. These areas of spondylolisthesis are felt to be secondary to underlying spondylosis. The prevertebral soft tissues and predental space regions are normal. There is severe disc space narrowing at C4-5, C5-6, and C6-7. There is moderate narrowing at all other levels. There is facet hypertrophy to varying degrees at all levels bilaterally. There is no frank disc extrusion or stenosis. There is exit foraminal narrowing due to bony hypertrophy at multiple levels, most marked at C5-6 on the left.  IMPRESSION: CT head: Atrophy with mild periventricular small vessel disease. No intracranial mass, hemorrhage, or subdural/epidural hematoma. No acute infarct apparent. Right frontal scalp hematoma.  CT cervical spine: Extensive spondylosis. Mild spondylolisthesis at most levels, felt to be due to spondylosis. No fracture apparent.   Electronically Signed   By: Lowella Grip III M.D.   On: 03/09/2015 10:47   Ct Cervical Spine Wo Contrast  03/09/2015   CLINICAL DATA:  Patient became dizzy and fell, hitting head on bed frame. Complaining of head and neck pain  EXAM: CT HEAD WITHOUT CONTRAST  CT CERVICAL SPINE WITHOUT CONTRAST  TECHNIQUE: Multidetector CT imaging of the head and cervical spine was performed following the standard protocol without intravenous contrast. Multiplanar CT image reconstructions of the cervical spine were also generated.  COMPARISON:  None.  FINDINGS: CT HEAD FINDINGS  There is moderate diffuse atrophy. There is no intracranial mass, hemorrhage, extra-axial  fluid collection, or midline shift. There is mild small vessel disease in the centra semiovale bilaterally. No acute infarct apparent. Bony calvarium appears intact. The mastoid air cells are clear. There is a right frontal scalp hematoma. There is mild mucosal thickening in the inferior right maxillary antrum.  CT CERVICAL SPINE FINDINGS  There is no apparent fracture. There is 3 mm of anterolisthesis of C2 on C3. There is 5 mm of anterolisthesis of C3 on C4. There is 1 mm of retrolisthesis of C4 on C5. There is 1 mm of retrolisthesis of C5 on C6. There is 2 mm of anterolisthesis of C7 on T1. These areas of spondylolisthesis are felt to be secondary to underlying spondylosis. The prevertebral soft tissues and predental space regions are normal. There is severe disc space narrowing at C4-5, C5-6, and C6-7. There is moderate narrowing at all other levels. There is facet hypertrophy to varying degrees at all levels bilaterally. There is no frank disc extrusion or stenosis. There is exit foraminal narrowing due to bony hypertrophy at multiple levels, most marked at C5-6 on the left.  IMPRESSION: CT head: Atrophy with mild periventricular small vessel disease. No intracranial mass, hemorrhage, or subdural/epidural hematoma. No acute infarct apparent. Right frontal scalp hematoma.  CT cervical spine: Extensive spondylosis. Mild spondylolisthesis at most levels, felt to be due to spondylosis. No fracture apparent.   Electronically Signed   By: Lowella Grip III  M.D.   On: 03/09/2015 10:47   Dg Hip Unilat With Pelvis 2-3 Views Right  03/11/2015   CLINICAL DATA:  Status post fall. Hit head on bed frame. Right hip pain and weakness. Initial encounter.  EXAM: RIGHT HIP (WITH PELVIS) 2-3 VIEWS  COMPARISON:  None.  FINDINGS: There is no evidence of fracture or dislocation. Both femoral heads are seated normally within their respective acetabula. The proximal right femur appears intact. Degenerative change is noted at the  lower lumbar spine. The sacroiliac joints are unremarkable in appearance.  The visualized bowel gas pattern is grossly unremarkable in appearance. Diffuse vascular calcifications are seen.  IMPRESSION: 1. No evidence of fracture or dislocation. 2. Degenerative change at the lower lumbar spine. 3. Diffuse vascular calcifications seen.   Electronically Signed   By: Garald Balding M.D.   On: 03/11/2015 21:45    CBC  Recent Labs Lab 03/09/15 1018  03/11/15 2013  03/12/15 0523  03/13/15 0125 03/13/15 0840 03/13/15 1840 03/14/15 0815 03/15/15 0333  WBC 11.3*  --  23.8*  --  19.5*  --   --   --   --  12.8* 10.4  HGB 12.8*  < > 11.6*  < > 10.8*  < > 10.2* 10.2* 11.8* 11.2* 11.3*  HCT 37.0*  < > 31.7*  < > 29.6*  < > 28.0* 28.9* 32.3* 30.8* 31.6*  PLT 210  --  188  --  187  --   --   --   --  236 261  MCV 96.1  --  92.4  --  91.6  --   --   --   --  92.2 92.7  MCH 33.2  --  33.8  --  33.4  --   --   --   --  33.5 33.1  MCHC 34.6  --  36.6*  --  36.5*  --   --   --   --  36.4* 35.8  RDW 14.3  --  13.5  --  13.5  --   --   --   --  13.9 14.1  LYMPHSABS 0.9  --  0.6*  --   --   --   --   --   --   --   --   MONOABS 1.0  --  1.9*  --   --   --   --   --   --   --   --   EOSABS 0.2  --  0.0  --   --   --   --   --   --   --   --   BASOSABS 0.0  --  0.0  --   --   --   --   --   --   --   --   < > = values in this interval not displayed.  Chemistries   Recent Labs Lab 03/12/15 0523 03/12/15 1200  03/13/15 0300 03/13/15 0333 03/13/15 1840 03/13/15 2034 03/14/15 0815 03/15/15 0333  NA 119* 118*  < > 120* 124* 123* 122* 125* 124*  K 4.5 4.1  --  3.9  --   --   --  3.7 4.4  CL 89* 92*  --  94*  --   --   --  96* 95*  CO2 20 16*  --  18*  --   --   --  20* 19*  GLUCOSE 113* 93  --  88  --   --   --  115* 119*  BUN 19 22  --  21*  --   --   --  23* 29*  CREATININE 0.93 0.84  --  0.99  --   --   --  1.18 1.21  CALCIUM 8.2* 7.8*  --  7.8*  --   --   --  8.1* 8.4*  MG  --   --   --   --   --   1.9  --   --   --   < > = values in this interval not displayed. ------------------------------------------------------------------------------------------------------------------ estimated creatinine clearance is 46.7 mL/min (by C-G formula based on Cr of 1.21). ------------------------------------------------------------------------------------------------------------------ No results for input(s): HGBA1C in the last 72 hours. ------------------------------------------------------------------------------------------------------------------ No results for input(s): CHOL, HDL, LDLCALC, TRIG, CHOLHDL, LDLDIRECT in the last 72 hours. ------------------------------------------------------------------------------------------------------------------ No results for input(s): TSH, T4TOTAL, T3FREE, THYROIDAB in the last 72 hours.  Invalid input(s): FREET3 ------------------------------------------------------------------------------------------------------------------ No results for input(s): VITAMINB12, FOLATE, FERRITIN, TIBC, IRON, RETICCTPCT in the last 72 hours.  Coagulation profile  Recent Labs Lab 03/11/15 2013 03/12/15 0523 03/13/15 0300 03/14/15 0242 03/15/15 0333  INR 2.95* 1.99* 1.45 1.94* 1.83*    No results for input(s): DDIMER in the last 72 hours.  Cardiac Enzymes No results for input(s): CKMB, TROPONINI, MYOGLOBIN in the last 168 hours.  Invalid input(s): CK ------------------------------------------------------------------------------------------------------------------ Invalid input(s): POCBNP    Alyssah Algeo D.O. on 03/15/2015 at 11:39 AM  Between 7am to 7pm - Pager - [phone]  After 7pm go to [email or web address] - password [email or web address]  And look for the night coverage person covering for me after hours  [facility] Office  [phone]

## 2015-03-15 NOTE — Progress Notes (Signed)
[facility] PROGRESS NOTE Subjective Tolerating pureed foods w/o problems.  Objective: Vital signs in last 24 hours: Temp:  [97.6 F (36.4 C)-98.9 F (37.2 C)] 97.8 F (36.6 C) (05/03 1252) Pulse Rate:  [61-126] 61 (05/03 0745) Resp:  [16-21] 17 (05/03 0300) BP: (102-124)/(63-86) 124/63 mmHg (05/03 0745) SpO2:  [94 %-95 %] 95 % (05/03 0745)    Intake/Output from previous day: 05/02 0701 - 05/03 0700 In: 625 [P.O.:600; I.V.:25] Out: 570 [Urine:570] Intake/Output this shift: Total I/O In: 240 [P.O.:240] Out: 125 [Urine:125]  PE: General--NAD eating lunch   Lab Results:  Recent Labs  03/13/15 0125 03/13/15 0840 03/13/15 1840 03/14/15 0815 03/15/15 0333  WBC  --   --   --  12.8* 10.4  HGB 10.2* 10.2* 11.8* 11.2* 11.3*  HCT 28.0* 28.9* 32.3* 30.8* 31.6*  PLT  --   --   --  236 261   BMET  Recent Labs  03/13/15 0300 03/13/15 0333 03/13/15 1840 03/13/15 2034 03/14/15 0815 03/15/15 0333  NA 120* 124* 123* 122* 125* 124*  K 3.9  --   --   --  3.7 4.4  CL 94*  --   --   --  96* 95*  CO2 18*  --   --   --  20* 19*  CREATININE 0.99  --   --   --  1.18 1.21   LFT No results for input(s): PROT, AST, ALT, ALKPHOS, BILITOT, BILIDIR, IBILI in the last 72 hours. PT/INR  Recent Labs  03/13/15 0300 03/14/15 0242 03/15/15 0333  LABPROT 17.8* 22.3* 21.3*  INR 1.45 1.94* 1.83*   PANCREAS No results for input(s): LIPASE in the last 72 hours.       Studies/Results: No results found.  Medications: I have reviewed the patient's current medications.  Assessment/Plan: 1. Erosive Esophagitis with Stricture. Long discussion with pt and wife about eating pureed food at home and make appt with Dr Michail Sermon in 3 weeks to arrange OP dilatation. Would send home on Protonix 40 bid. I think would be fine to resume coumadin at the normal dose for him on discharge . Please call us for problems   Kallin Henk JR,Nillie Bartolotta L 03/15/2015, 1:46 PM  Pager:  [phone] If no answer or after hours call [phone]

## 2015-03-15 NOTE — Progress Notes (Signed)
Physical Therapy Treatment Patient Details Name: [name] MRN: [id number] DOB: [birth year] Today's Date: 03/15/2015    History of Present Illness Patient is a 81 year old male with A.Fib on Coumadin Rx presented to ED with generalized weakness, and in lower extremities, unable to walk. He was also having increased pain in his right hip since his fall on Wednesday 4/27. UGIB, left rib fxs, right facial brusing    PT Comments    Pt has progressed nicely over last few days with improved stability, tolerance for activity.  Follow Up Recommendations  CIR;Other (comment) (short stent to get rid of the RW)     Equipment Recommendations  3in1 (PT)    Recommendations for Other Services       Precautions / Restrictions Precautions Precautions: Fall Restrictions Weight Bearing Restrictions: No    Mobility  Bed Mobility                  Transfers Overall transfer level: Needs assistance Equipment used: Rolling walker (2 wheeled) Transfers: Sit to/from Stand Sit to Stand: Min guard         General transfer comment: powered up without assist after several rocking attempt to get forward.  Ambulation/Gait Ambulation/Gait assistance: Min guard Ambulation Distance (Feet): 140 Feet Assistive device: Rolling walker (2 wheeled) Gait Pattern/deviations: Step-through pattern;Scissoring;Drifts right/left Gait velocity: moderate speeds   General Gait Details: mildly unsteady throughout, needing cues and assist to maneuver the RW to get pt into the RW   Stairs            Wheelchair Mobility    Modified Rankin (Stroke Patients Only)       Balance Overall balance assessment: No apparent balance deficits (not formally assessed) Sitting-balance support: No upper extremity supported Sitting balance-Leahy Scale: Fair       Standing balance-Leahy Scale: Fair                      Cognition Arousal/Alertness: Awake/alert Behavior During Therapy: WFL for  tasks assessed/performed Overall Cognitive Status: Within Functional Limits for tasks assessed                      Exercises General Exercises - Lower Extremity Hip Flexion/Marching: AROM;Strengthening;Both;10 reps;Seated (graded resistance flexion and extension.) Toe Raises: AROM;Strengthening;Both;10 reps;Seated Heel Raises: AROM;Strengthening;Both;10 reps;Seated    General Comments        Pertinent Vitals/Pain Pain Assessment: Faces Faces Pain Scale: No hurt    Home Living                      Prior Function            PT Goals (current goals can now be found in the care plan section) Acute Rehab PT Goals Patient Stated Goal: preferably to go home PT Goal Formulation: With patient Time For Goal Achievement: 03/27/15 Potential to Achieve Goals: Good Progress towards PT goals: Progressing toward goals    Frequency  Min 3X/week    PT Plan Current plan remains appropriate    Co-evaluation             End of Session   Activity Tolerance: Patient tolerated treatment well Patient left: in chair;with call bell/phone within reach;with family/visitor present     Time: 8850-2774 PT Time Calculation (min) (ACUTE ONLY): 17 min  Charges:  $Gait Training: 8-22 mins                    G  Codes:      MottingerTessie Fass 03/15/2015, 4:51 PM 03/15/2015  Donnella Sham, PT [phone]  (pager)

## 2015-03-15 NOTE — Care Management Note (Signed)
Case Management Note  Patient Details  Name: [name] MRN: [id number] Date of Birth: 1923-05-04  Subjective/Objective:                    Action/Plan:   Expected Discharge Date:  03/14/15               Expected Discharge Plan:  Home/Self Care  In-House Referral:     Discharge planning Services  CM Consult  Post Acute Care Choice:    Choice offered to:     DME Arranged:    DME Agency:     HH Arranged:    [name]:     Status of Service:     Medicare Important Message Given:    Date Medicare IM Given:    Medicare IM give by:    Date Additional Medicare IM Given:    Additional Medicare Important Message give by:     If discussed at [place] of Stay Meetings, dates discussed:     Additional Comments: CM spoke with [name] @ [facility] [phone], referral made for inpt rehab. and information regarding pt faxed [phone]). Whitman Hero [place], [place] 03/15/2015, 12:06 PM

## 2015-03-15 NOTE — Progress Notes (Addendum)
Pt's son requested referral to be  made for inpt rehab. with [facility] [phone])  for dad, CM called left voice message with center , awaiting call back.

## 2015-03-16 ENCOUNTER — Other Ambulatory Visit (INDEPENDENT_AMBULATORY_CARE_PROVIDER_SITE_OTHER): Payer: Medicare Other | Admitting: Ophthalmology

## 2015-03-16 DIAGNOSIS — S2232XA Fracture of one rib, left side, initial encounter for closed fracture: Secondary | ICD-10-CM | POA: Diagnosis not present

## 2015-03-16 DIAGNOSIS — K219 Gastro-esophageal reflux disease without esophagitis: Secondary | ICD-10-CM | POA: Diagnosis not present

## 2015-03-16 DIAGNOSIS — I2510 Atherosclerotic heart disease of native coronary artery without angina pectoris: Secondary | ICD-10-CM | POA: Diagnosis not present

## 2015-03-16 DIAGNOSIS — R531 Weakness: Secondary | ICD-10-CM | POA: Diagnosis not present

## 2015-03-16 DIAGNOSIS — E871 Hypo-osmolality and hyponatremia: Secondary | ICD-10-CM | POA: Diagnosis not present

## 2015-03-16 DIAGNOSIS — R279 Unspecified lack of coordination: Secondary | ICD-10-CM | POA: Diagnosis not present

## 2015-03-16 DIAGNOSIS — I4891 Unspecified atrial fibrillation: Secondary | ICD-10-CM | POA: Diagnosis not present

## 2015-03-16 DIAGNOSIS — M6281 Muscle weakness (generalized): Secondary | ICD-10-CM | POA: Diagnosis not present

## 2015-03-16 DIAGNOSIS — I10 Essential (primary) hypertension: Secondary | ICD-10-CM | POA: Diagnosis not present

## 2015-03-16 DIAGNOSIS — D649 Anemia, unspecified: Secondary | ICD-10-CM | POA: Diagnosis not present

## 2015-03-16 DIAGNOSIS — H409 Unspecified glaucoma: Secondary | ICD-10-CM | POA: Diagnosis not present

## 2015-03-16 DIAGNOSIS — K5900 Constipation, unspecified: Secondary | ICD-10-CM | POA: Diagnosis not present

## 2015-03-16 DIAGNOSIS — D500 Iron deficiency anemia secondary to blood loss (chronic): Secondary | ICD-10-CM | POA: Diagnosis not present

## 2015-03-16 DIAGNOSIS — I4892 Unspecified atrial flutter: Secondary | ICD-10-CM | POA: Diagnosis not present

## 2015-03-16 DIAGNOSIS — N390 Urinary tract infection, site not specified: Secondary | ICD-10-CM | POA: Diagnosis not present

## 2015-03-16 DIAGNOSIS — K2211 Ulcer of esophagus with bleeding: Secondary | ICD-10-CM | POA: Diagnosis not present

## 2015-03-16 DIAGNOSIS — K922 Gastrointestinal hemorrhage, unspecified: Secondary | ICD-10-CM | POA: Diagnosis not present

## 2015-03-16 DIAGNOSIS — K30 Functional dyspepsia: Secondary | ICD-10-CM | POA: Diagnosis not present

## 2015-03-16 DIAGNOSIS — S22028B Other fracture of second thoracic vertebra, initial encounter for open fracture: Secondary | ICD-10-CM | POA: Diagnosis not present

## 2015-03-16 DIAGNOSIS — D689 Coagulation defect, unspecified: Secondary | ICD-10-CM | POA: Diagnosis not present

## 2015-03-16 DIAGNOSIS — R1314 Dysphagia, pharyngoesophageal phase: Secondary | ICD-10-CM | POA: Diagnosis not present

## 2015-03-16 LAB — BASIC METABOLIC PANEL WITH GFR
Anion gap: 10 (ref 5–15)
BUN: 26 mg/dL — ABNORMAL HIGH (ref 6–20)
CO2: 20 mmol/L — ABNORMAL LOW (ref 22–32)
Calcium: 8.3 mg/dL — ABNORMAL LOW (ref 8.9–10.3)
Chloride: 98 mmol/L — ABNORMAL LOW (ref 101–111)
Creatinine, Ser: 0.93 mg/dL (ref 0.61–1.24)
GFR calc Af Amer: >60 mL/min
GFR calc non Af Amer: >60 mL/min
Glucose, Bld: 109 mg/dL — ABNORMAL HIGH (ref 70–99)
Potassium: 4 mmol/L (ref 3.5–5.1)
Sodium: 128 mmol/L — ABNORMAL LOW (ref 135–145)

## 2015-03-16 LAB — PROTIME-INR
INR: 1.55 — AB (ref 0.00–1.49)
Prothrombin Time: 18.7 seconds — ABNORMAL HIGH (ref 11.6–15.2)

## 2015-03-16 LAB — OCCULT BLOOD X 1 CARD TO LAB, STOOL: Fecal Occult Bld: NEGATIVE

## 2015-03-16 MED ORDER — PANTOPRAZOLE SODIUM 40 MG PO TBEC: 40.0000 mg | DELAYED_RELEASE_TABLET | Freq: Two times a day (BID) | ORAL | Status: DC

## 2015-03-16 MED ORDER — CEPHALEXIN 500 MG PO CAPS: 500.0000 mg | ORAL_CAPSULE | Freq: Two times a day (BID) | ORAL | Status: DC

## 2015-03-16 MED ORDER — METOPROLOL SUCCINATE ER 25 MG PO TB24: 75.0000 mg | ORAL_TABLET | Freq: Two times a day (BID) | ORAL | Status: DC

## 2015-03-16 MED ORDER — DOCUSATE SODIUM 100 MG PO CAPS: 100.0000 mg | ORAL_CAPSULE | Freq: Two times a day (BID) | ORAL | Status: DC

## 2015-03-16 MED ORDER — BISACODYL 10 MG RE SUPP
10.0000 mg | Freq: Once | RECTAL | Status: AC
  Administered 2015-03-16: 10 mg via RECTAL
  Filled 2015-03-16: qty 1

## 2015-03-16 MED ORDER — DIGOXIN 0.25 MG/ML IJ SOLN
0.2500 mg | Freq: Four times a day (QID) | INTRAMUSCULAR | Status: AC
  Administered 2015-03-16 (×2): 0.25 mg via INTRAVENOUS
  Filled 2015-03-16 (×2): qty 1

## 2015-03-16 MED ORDER — DIGOXIN 125 MCG PO TABS: 0.1250 mg | ORAL_TABLET | Freq: Every day | ORAL | Status: DC

## 2015-03-16 MED ORDER — ALUM & MAG HYDROXIDE-SIMETH 200-200-20 MG/5ML PO SUSP: 30.0000 mL | Freq: Four times a day (QID) | ORAL | Status: DC | PRN

## 2015-03-16 NOTE — Discharge Instructions (Signed)
Esophagitis Esophagitis is inflammation of the esophagus. It can involve swelling, soreness, and pain in the esophagus. This condition can make it difficult and painful to swallow. CAUSES  Most causes of esophagitis are not serious. Many different factors can cause esophagitis, including:  Gastroesophageal reflux disease (GERD). This is when acid from your stomach flows up into the esophagus.  Recurrent vomiting.  An allergic-type reaction.  Certain medicines, especially those that come in large pills.  Ingestion of harmful chemicals, such as household cleaning products.  Heavy alcohol use.  An infection of the esophagus.  Radiation treatment for cancer.  Certain diseases such as sarcoidosis, Crohn's disease, and scleroderma. These diseases may cause recurrent esophagitis. SYMPTOMS   Trouble swallowing.  Painful swallowing.  Chest pain.  Difficulty breathing.  Nausea.  Vomiting.  Abdominal pain. DIAGNOSIS  Your caregiver will take your history and do a physical exam. Depending upon what your caregiver finds, certain tests may also be done, including:  Barium X-ray. You will drink a solution that coats the esophagus, and X-rays will be taken.  Endoscopy. A lighted tube is put down the esophagus so your caregiver can examine the area.  Allergy tests. These can sometimes be arranged through follow-up visits. TREATMENT  Treatment will depend on the cause of your esophagitis. In some cases, steroids or other medicines may be given to help relieve your symptoms or to treat the underlying cause of your condition. Medicines that may be recommended include:  Viscous lidocaine, to soothe the esophagus.  Antacids.  Acid reducers.  Proton pump inhibitors.  Antiviral medicines for certain viral infections of the esophagus.  Antifungal medicines for certain fungal infections of the esophagus.  Antibiotic medicines, depending on the cause of the esophagitis. HOME CARE  INSTRUCTIONS   Avoid foods and drinks that seem to make your symptoms worse.  Eat small, frequent meals instead of large meals.  Avoid eating for the 3 hours prior to your bedtime.  If you have trouble taking pills, use a pill splitter to decrease the size and likelihood of the pill getting stuck or injuring the esophagus on the way down. Drinking water after taking a pill also helps.  Stop smoking if you smoke.  Maintain a healthy weight.  Wear loose-fitting clothing. Do not wear anything tight around your waist that causes pressure on your stomach.  Raise the head of your bed 6 to 8 inches with wood blocks to help you sleep. Extra pillows will not help.  Only take over-the-counter or prescription medicines as directed by your caregiver. SEEK IMMEDIATE MEDICAL CARE IF:  You have severe chest pain that radiates into your arm, neck, or jaw.  You feel sweaty, dizzy, or lightheaded.  You have shortness of breath.  You vomit blood.  You have difficulty or pain with swallowing.  You have bloody or black, tarry stools.  You have a fever.  You have a burning sensation in the chest more than 3 times a week for more than 2 weeks.  You cannot swallow, drink, or eat.  You drool because you cannot swallow your saliva. MAKE SURE YOU:  Understand these instructions.  Will watch your condition.  Will get help right away if you are not doing well or get worse. Document Released: 12/06/2004 Document Revised: 01/21/2012 Document Reviewed: 06/29/2011 ExitCare Patient Information 2015 [employer]. This information is not intended to replace advice given to you by your health care provider. Make sure you discuss any questions you have with your health care provider.  

## 2015-03-16 NOTE — Progress Notes (Signed)
Patient Discharge: Disposition: patient discharged to [facility] facility. Given report to the supervisor. IV: Discontinued before discharge. Telemetry: Discontinued before discharge, CCMD notified. Transportation: Patient transported via EMS. Belongings: patient took all his belongings with him.

## 2015-03-16 NOTE — Progress Notes (Signed)
Occupational Therapy Treatment Patient Details Name: [name] MRN: [id number] DOB: 1923/08/06 Today's Date: 03/16/2015    History of present illness Patient is a 81 year old male with A.Fib on Coumadin Rx presented to ED with generalized weakness, and in lower extremities, unable to walk. He was also having increased pain in his right hip since his fall on Wednesday 4/27. UGIB, left rib fxs, right facial brusing   OT comments  Focus of session on toileting, standing grooming and LB dressing.  Pt with some denial of deficits in strength and balance and therefore his high fall risk.  Instructed pt and wife in use of 3 in 1 over toilet and of the benefits to seated showering.    Follow Up Recommendations  CIR    Equipment Recommendations       Recommendations for Other Services      Precautions / Restrictions Precautions Precautions: Fall       Mobility Bed Mobility               General bed mobility comments: pt received in chair  Transfers Overall transfer level: Needs assistance Equipment used: Rolling walker (2 wheeled) Transfers: Sit to/from Stand Sit to Stand: Min guard;Min assist         General transfer comment: min from standard toilet, min guard from chair    Balance Overall balance assessment: Needs assistance Sitting-balance support: Feet supported Sitting balance-Leahy Scale: Fair     Standing balance support: During functional activity Standing balance-Leahy Scale: Poor Standing balance comment: difficulty releasing grasp of walker or grab bar to perform pericare or pull up pants                   ADL Overall ADL's : Needs assistance/impaired     Grooming: Wash/dry hands;Min guard;Standing               Lower Body Dressing: Minimal assistance;Sit to/from stand Lower Body Dressing Details (indicate cue type and reason): pt able to donn and doff socks  Toilet Transfer: Minimal assistance;Regular Toilet;Ambulation Toilet  Transfer Details (indicate cue type and reason): assist to control descent onto regular height toilet, used B UEs to pull up on grab bar  Toileting- Clothing Manipulation and Hygiene: Maximal assistance;Sit to/from stand       Functional mobility during ADLs: Min guard;Rolling walker General ADL Comments: Pt with episode of loose stool while standing to urinate at toilet.        Vision                     Perception     Praxis      Cognition   Behavior During Therapy: WFL for tasks assessed/performed Overall Cognitive Status: Within Functional Limits for tasks assessed                       Extremity/Trunk Assessment               Exercises     Shoulder Instructions       General Comments      Pertinent Vitals/ Pain       Pain Assessment: No/denies pain  Home Living                                          Prior Functioning/Environment  Frequency Min 2X/week     Progress Toward Goals  OT Goals(current goals can now be found in the care plan section)  Progress towards OT goals: Progressing toward goals  Acute Rehab OT Goals Patient Stated Goal: preferably to go home Time For Goal Achievement: 03/21/15 Potential to Achieve Goals: Good  Plan Discharge plan remains appropriate    Co-evaluation                 End of Session Equipment Utilized During Treatment: Gait belt;Rolling walker   Activity Tolerance Patient tolerated treatment well   Patient Left in bed;with call bell/phone within reach;with family/visitor present (EOB)   Nurse Communication  (aware of loose stool)        Time: 0786-7544 OT Time Calculation (min): 26 min  Charges: OT General Charges $OT Visit: 1 Procedure OT Treatments $Self Care/Home Management : 23-37 mins  Malka So 03/16/2015, 11:56 AM  [phone]

## 2015-03-16 NOTE — Clinical Social Work Note (Signed)
Clinical Social Work Assessment  Patient Details  Name: [name] MRN: [id number] Date of Birth: 1923-09-27  Date of referral:  03/16/15               Reason for consult:  Facility Placement                Permission sought to share information with:  Family Supports Permission granted to share information::  Yes, Verbal Permission Granted  Name::     Son [place] and Wife Blanch Media who were at the bedside  Agency::     Relationship::     Contact Information:     Housing/Transportation Living arrangements for the past 2 months:  Single Family Home Source of Information:  Adult Children, Spouse Patient Interpreter Needed:  None Criminal Activity/Legal Involvement Pertinent to Current Situation/Hospitalization:  No - Comment as needed Significant Relationships:  Adult Children Lives with:  Spouse Do you feel safe going back to the place where you live?  Yes Need for family participation in patient care:  Yes (Comment)  Care giving concerns:  None mentioned by family  Social Worker assessment / plan:  CSW talked with patient and family members Brianna Esson and [name], son, who was at the bedside. Both were pleasant and very receptive to [facility] for patient. They requested [facility] as patient is a [place] and this facility is close to their home. Patient did verbally acknowledge CSW when spoken to, but remained quiet during conversation. CSW and family discussed discharge plans and recommendation of ST rehab and their preference is [facility] skilled nursing facility.  Call made to [facility], admissions director at [facility] and they can take patient today.  Employment status:  Retired Health visitor, Managed Care PT Recommendations:  [place] / Referral to community resources:     Patient/Family's Response to care: Patient/family wants ST rehab and facility preference is [facility].   Patient/Family's Understanding of and Emotional Response  to Diagnosis, Current Treatment, and Prognosis:  Not discussed.  Emotional Assessment Appearance:  Appears stated age Attitude/Demeanor/Rapport:  Other (Appropriate for conversation) Affect (typically observed):  Calm, Quiet Orientation:  Oriented to Self, Oriented to Place, Oriented to  Time, Oriented to Situation Alcohol / Substance use:  Tobacco Use, Other (Patient smokes cigars. He does not drink alcohol or use illicit drugs.) Psych involvement (Current and /or in the community):  No (Comment)  Discharge Needs  Concerns to be addressed:  Discharge Planning Concerns Readmission within the last 30 days:  No Current discharge risk:  None Barriers to Discharge:  No Barriers Identified   Sable Feil, LCSW 03/16/2015, 2:10 PM

## 2015-03-16 NOTE — Clinical Social Work Placement (Signed)
   CLINICAL SOCIAL WORK PLACEMENT  NOTE  Date:  03/16/2015  Patient Details  Name: [name] MRN: [id number] Date of Birth: 1923-01-27  Clinical Social Work is seeking post-discharge placement for this patient at the [place] level of care (*CSW will initial, date and re-position this form in  chart as items are completed):  Yes   Patient/family provided with [facility] Work Department's list of facilities offering this level of care within the geographic area requested by the patient (or if unable, by the patient's family).  Yes   Patient/family informed of their freedom to choose among providers that offer the needed level of care, that participate in Medicare, Medicaid or managed care program needed by the patient, have an available bed and are willing to accept the patient.  No   Patient/family informed of [facility]'s ownership interest in [facility] and [facility], as well as of the fact that they are under no obligation to receive care at these facilities.  PASRR submitted to EDS on 03/16/15     PASRR number received on 03/16/15     Existing PASRR number confirmed on 03/16/15     FL2 transmitted to all facilities in geographic area requested by pt/family on 03/15/15     FL2 transmitted to all facilities within larger geographic area on       Patient informed that his/her managed care company has contracts with or will negotiate with certain facilities, including the following:        Yes   Patient/family informed of bed offers received.  Patient chooses bed at [facility]     Physician recommends and patient chooses bed at      Patient to be transferred to [facility] on 03/16/15.  Patient to be transferred to facility by [name])     Patient family notified on 03/16/15 of transfer.  Name of family member notified:  Son [name] and wife, [name] at the bedside     PHYSICIAN Please prepare priority discharge  summary, including medications, Please prepare prescriptions, Please sign DNR, Please sign FL2     Additional Comment:    _______________________________________________ Sable Feil, LCSW 03/16/2015, 4:11 PM

## 2015-03-16 NOTE — Progress Notes (Signed)
Medicare Important Message given? YES  Date Medicare IM given:  03/16/2015 Medicare IM given by: Jonnie Finner

## 2015-03-16 NOTE — Progress Notes (Signed)
I met with pt, his wife, and son at bedside. I do not have a bed available to admit pt to today. I have notified SW of the families choices of beds if available. SW will follow up today. Dr. MIkhail made aware also. [phone] 

## 2015-03-16 NOTE — Progress Notes (Signed)
Subjective:  Heart rate again is increased today.  Complains of constipation.  Still unsteady on feet and plan now is to go to rehabilitation.  No shortness of breath or chest pain.  Requires a lot of assistance.  Objective:  Vital Signs in the last 24 hours: BP 126/67 mmHg  Pulse 72  Temp(Src) 98.2 F (36.8 C) (Oral)  Resp 17  Ht 5\' 10"  (1.778 m)  Wt 90.3 kg (199 lb 1.2 oz)  BMI 28.56 kg/m2  SpO2 96%  Physical Exam: Pleasant elderly male sitting at bedside in chair in no acute distress Head: Scalp hematoma old with ecchymoses on right side of face Lungs:  Clear  Cardiac:  irregular rhythm, normal S1 and S2, no S3 Abdomen:  Soft, nontender, no masses Extremities:  No edema present Neuro: Unsteady on feet  Intake/Output from previous day: 05/03 0701 - 05/04 0700 In: 600 [P.O.:600] Out: 1400 [Urine:1400] Weight Filed Weights   03/13/15 0318 03/14/15 0500 03/15/15 2145  Weight: 88 kg (194 lb 0.1 oz) 89.9 kg (198 lb 3.1 oz) 90.3 kg (199 lb 1.2 oz)    Lab Results: Basic Metabolic Panel:  Recent Labs  03/15/15 0333 03/16/15 0511  NA 124* 128*  K 4.4 4.0  CL 95* 98*  CO2 19* 20*  GLUCOSE 119* 109*  BUN 29* 26*  CREATININE 1.21 0.93    CBC:  Recent Labs  03/14/15 0815 03/15/15 0333  WBC 12.8* 10.4  HGB 11.2* 11.3*  HCT 30.8* 31.6*  MCV 92.2 92.7  PLT 236 261    BNP No results found for: BNP  PROTIME: Lab Results  Component Value Date   INR 1.55* 03/16/2015   INR 1.83* 03/15/2015   INR 1.94* 03/14/2015    Telemetry: Tachycardia with block with slow her rhythm since yesterday currently running around 70-80 BPM  Assessment/Plan:  1. Atrial tachycardia versus flutter with improvement in heart rate is increased again today.   2. History of anticoagulation currently subtherapeutic but GI says can resume on discharge 3. Rheumatoid arthritis 4. Severe hyponatremia-slowly improving 5.  Erosive esophagitis  Recommendations:  With his heart rate  increasing I would go ahead and give some additional Lanoxin and place on a low dose of Lanoxin to help with rate control.   Kerry Hough  MD [facility] Cardiology  03/16/2015, 8:52 AM

## 2015-03-16 NOTE — Discharge Summary (Signed)
Physician Discharge Summary  [name] TFT:[id number] DOB: [birth year] DOA: 03/11/2015  PCP: Merrilee Seashore, MD  Admit date: 03/11/2015 Discharge date: 03/16/2015  Time spent: 45 minutes  Recommendations for Outpatient Follow-up:  Patient will be discharged to [facility] skilled nursing facility.  Patient should continue physical and occupational therapy as recommended by rehabilitation.  Patient will need to follow up with primary care provider within one week of discharge.  Patient should also follow up with Dr. Michail Sermon, gastroenterologist, in 3 weeks for dilatation.  Patient should continue medications as prescribed.  Patient should follow a dysphagia 1 diet with 1535ml fluid restriction per day.  Discharge Diagnoses:  Hyponatremia Anemia with upper GI bleed/erosive esophagitis with stricture UTI Atrial fibrillation Generalized weakness Left rib fracture Scalp hematoma  Discharge Condition: Stable  Diet recommendation: Dysphagia 1 diet with 1589ml fluid restriction per day.  Filed Weights   03/13/15 0318 03/14/15 0500 03/15/15 2145  Weight: 88 kg (194 lb 0.1 oz) 89.9 kg (198 lb 3.1 oz) 90.3 kg (199 lb 1.2 oz)    History of present illness:  on 03/11/2015 by Dr. Jana Hakim [name] is a 81 y.o. male with a history of A.Fib on Coumadin Rx who presents to the ED with complaints of increased weakness in both legs and being unable to walk. He also has increased pain in his right hip since a fall 2 days ago, he was evaluated in the ED and had x-rays which were negative and a ct scan of the head which was also negative for acute findings except for a right frontal scalp hematoma. He was evaluated in the ED and was found to have hyponatremia, he denies any nausea or vomiting or diarrhea, and he is not on any diuretic rx. He also denies any chest pain.Patient had a episode of hematemesis of 200 cc of coffee Ground emesis, and he was re-evaluated, and he denies any  ABD pain. He was upgraded to [facility], and placed on a IV Protonix Drip and his coumadin is being reversed.  Hospital Course:  Hyponatremia -Sodium was 113 at the time of admission, Serum osmolarity 249, urine osmolarity 339 -Currently improving, 128 -Urine sodium 17- repeated found to be 13 (03/13/2015) -As sodium did not improve significantly with IV -Nephrology was consulted and appreciated. Recommended 1.5 fluid restriction and IVF were discontinued, suspect SIADH vs pain, UTI, or esphagitis -Continue to monitor BMP  Anemia with Upper GI bleed/erosive esophagitis with stricture -Patient initially placed on Protonix prescription, however transitioned to by mouth Protonix -no further gross bleeding, hemoglobin currently stable -GI consulted- EGD done 4/30: distal erosive esophagitis likely cause of hematemesis, distal benign appearing stricture -Initially placed on liquid diet however per GI patient is to continue a pured diet. -advised patient to avoid NSAIDS -Patient will need follow-up with Dr. Michail Sermon in 3 weeks to arrange dilatation  UTI (lower urinary tract infection) -Culture reveals coag neg staph- although asymptomatic, UA grossly positive -started on IV Rocephin- will continue Keflex for additional 4 days (for 10 days of coverage) -Previous hospitalist discussed case with ID, high chance this is colonization rather than a UTI as he is asymptomatic  A-fib- currently junctional tachycardia? -HR better controlled, however, becomes tachycardic with movement -Cardiology consulted and appreciated. Dr Wynonia Lawman placed patient on low dose lanoxin after additional dose -Cardiology recommended increasting metoprolol to 75mg  BID -CHADSVasc 3 -Continue Cardizem and Metoprolol -Coumadin held due to upper GI bleed. INR Improved to 1.55 -GI to advise when to resume Coumadin- spoke with Dr. Oletta Lamas  who recommended holding coumadin during hospitalization but may resume at  discharge  Generalized Weakness -PT/OT consulted and recommended CIR -he is willing to go to SNF if CIR is able to accept -CIR consulted -Social work consulted  Left rib fracture- 5th, 6th, 7th Secondary to fall with a scalp hematoma -chest pain is only mild and well controlled with PRN Tylenol  Code Status: DNR  Procedures  EGD  Consults  Cardiology Gastroenterology Nephrology  Discharge Exam: Filed Vitals:   03/16/15 1029  BP: 118/57  Pulse: 69  Temp: 98.5 F (36.9 C)  Resp: 18   Exam  General: Well developed, well nourished, no distress  HEENT: [place], Right orbital ecchymosis, right frontal hematoma, mucous membranes moist.   Cardiovascular: S1 S2 auscultated, irregular  Respiratory: Clear to auscultation  Abdomen: Soft, nontender, nondistended, + bowel sounds  Extremities: warm dry without cyanosis clubbing or edema  Neuro: AAOx3, nonfocal   Discharge Instructions      Discharge Instructions    Discharge instructions    Complete by:  As directed   Patient will be discharged to [facility] skilled nursing facility.  Patient should continue physical and occupational therapy as recommended by rehabilitation.  Patient will need to follow up with primary care provider within one week of discharge.  Patient should also follow up with Dr. Michail Sermon, gastroenterologist, in 3 weeks for dilatation.  Patient should continue medications as prescribed.  Patient should follow a dysphagia 1 diet with 1591ml fluid restriction per day.            Medication List    TAKE these medications        alum & mag hydroxide-simeth 200-200-20 MG/5ML suspension  Commonly known as:  MAALOX/MYLANTA  Take 30 mLs by mouth every 6 (six) hours as needed for indigestion or heartburn (dyspepsia).     cephALEXin 500 MG capsule  Commonly known as:  KEFLEX  Take 1 capsule (500 mg total) by mouth 2 (two) times daily.     digoxin 0.125 MG tablet  Commonly known as:  LANOXIN  Take 1  tablet (0.125 mg total) by mouth daily.  Start taking on:  03/17/2015     diltiazem 360 MG 24 hr capsule  Commonly known as:  TIAZAC  Take 360 mg by mouth daily.     docusate sodium 100 MG capsule  Commonly known as:  COLACE  Take 1 capsule (100 mg total) by mouth 2 (two) times daily.     Fish Oil 1000 MG Caps  Take 2,000 capsules by mouth daily.     metoprolol succinate 25 MG 24 hr tablet  Commonly known as:  TOPROL-XL  Take 3 tablets (75 mg total) by mouth 2 (two) times daily. Take with or immediately following a meal.     pantoprazole 40 MG tablet  Commonly known as:  PROTONIX  Take 1 tablet (40 mg total) by mouth 2 (two) times daily before a meal.     PRESERVISION AREDS Caps  Take 1 capsule by mouth 2 (two) times daily.     warfarin 2 MG tablet  Commonly known as:  COUMADIN  Take 2-4 mg by mouth See admin instructions. 2mg  everyday except for thursdays - takes 4mg  on thursdays       Allergies  Allergen Reactions  . Shellfish Allergy Anaphylaxis  . Sulfa Antibiotics Anaphylaxis   Follow-up Information    Follow up with [facility], MD. Schedule an appointment as soon as possible for a visit in 1 week.  Specialty:  Internal Medicine   Why:  Hospital follow up   Contact information:   [place] [address] [phone]       Follow up with Lear Ng., MD. Schedule an appointment as soon as possible for a visit in 3 weeks.   Specialty:  Gastroenterology   Why:  Hospital follow up   Contact information:   [address] [place] [address] [phone]        The results of significant diagnostics from this hospitalization (including imaging, microbiology, ancillary and laboratory) are listed below for reference.    Significant Diagnostic Studies: Dg Ribs Unilateral W/chest Left  03/09/2015   CLINICAL DATA:  Status post fall today.  Anterior left rib pain.  EXAM: LEFT RIBS AND CHEST - 3+ VIEW   COMPARISON:  Single view of the chest 05/03/2011. CT chest 05/04/2011.  FINDINGS: Subsegmental atelectasis is seen in the left lung base. The lungs are otherwise clear. There is no pneumothorax or pleural effusion. Acute, nondisplaced fractures are seen in the left fifth through seventh ribs. No other fracture is identified.  IMPRESSION: Acute left fifth through seventh rib fractures. Negative for pneumothorax.  Cardiomegaly without edema.  Subsegmental atelectasis left lung base.   Electronically Signed   By: Inge Rise M.D.   On: 03/09/2015 11:13   Ct Head Wo Contrast  03/09/2015   CLINICAL DATA:  Patient became dizzy and fell, hitting head on bed frame. Complaining of head and neck pain  EXAM: CT HEAD WITHOUT CONTRAST  CT CERVICAL SPINE WITHOUT CONTRAST  TECHNIQUE: Multidetector CT imaging of the head and cervical spine was performed following the standard protocol without intravenous contrast. Multiplanar CT image reconstructions of the cervical spine were also generated.  COMPARISON:  None.  FINDINGS: CT HEAD FINDINGS  There is moderate diffuse atrophy. There is no intracranial mass, hemorrhage, extra-axial fluid collection, or midline shift. There is mild small vessel disease in the centra semiovale bilaterally. No acute infarct apparent. Bony calvarium appears intact. The mastoid air cells are clear. There is a right frontal scalp hematoma. There is mild mucosal thickening in the inferior right maxillary antrum.  CT CERVICAL SPINE FINDINGS  There is no apparent fracture. There is 3 mm of anterolisthesis of C2 on C3. There is 5 mm of anterolisthesis of C3 on C4. There is 1 mm of retrolisthesis of C4 on C5. There is 1 mm of retrolisthesis of C5 on C6. There is 2 mm of anterolisthesis of C7 on T1. These areas of spondylolisthesis are felt to be secondary to underlying spondylosis. The prevertebral soft tissues and predental space regions are normal. There is severe disc space narrowing at C4-5, C5-6,  and C6-7. There is moderate narrowing at all other levels. There is facet hypertrophy to varying degrees at all levels bilaterally. There is no frank disc extrusion or stenosis. There is exit foraminal narrowing due to bony hypertrophy at multiple levels, most marked at C5-6 on the left.  IMPRESSION: CT head: Atrophy with mild periventricular small vessel disease. No intracranial mass, hemorrhage, or subdural/epidural hematoma. No acute infarct apparent. Right frontal scalp hematoma.  CT cervical spine: Extensive spondylosis. Mild spondylolisthesis at most levels, felt to be due to spondylosis. No fracture apparent.   Electronically Signed   By: Lowella Grip III M.D.   On: 03/09/2015 10:47   Ct Cervical Spine Wo Contrast  03/09/2015   CLINICAL DATA:  Patient became dizzy and fell, hitting head on  bed frame. Complaining of head and neck pain  EXAM: CT HEAD WITHOUT CONTRAST  CT CERVICAL SPINE WITHOUT CONTRAST  TECHNIQUE: Multidetector CT imaging of the head and cervical spine was performed following the standard protocol without intravenous contrast. Multiplanar CT image reconstructions of the cervical spine were also generated.  COMPARISON:  None.  FINDINGS: CT HEAD FINDINGS  There is moderate diffuse atrophy. There is no intracranial mass, hemorrhage, extra-axial fluid collection, or midline shift. There is mild small vessel disease in the centra semiovale bilaterally. No acute infarct apparent. Bony calvarium appears intact. The mastoid air cells are clear. There is a right frontal scalp hematoma. There is mild mucosal thickening in the inferior right maxillary antrum.  CT CERVICAL SPINE FINDINGS  There is no apparent fracture. There is 3 mm of anterolisthesis of C2 on C3. There is 5 mm of anterolisthesis of C3 on C4. There is 1 mm of retrolisthesis of C4 on C5. There is 1 mm of retrolisthesis of C5 on C6. There is 2 mm of anterolisthesis of C7 on T1. These areas of spondylolisthesis are felt to be  secondary to underlying spondylosis. The prevertebral soft tissues and predental space regions are normal. There is severe disc space narrowing at C4-5, C5-6, and C6-7. There is moderate narrowing at all other levels. There is facet hypertrophy to varying degrees at all levels bilaterally. There is no frank disc extrusion or stenosis. There is exit foraminal narrowing due to bony hypertrophy at multiple levels, most marked at C5-6 on the left.  IMPRESSION: CT head: Atrophy with mild periventricular small vessel disease. No intracranial mass, hemorrhage, or subdural/epidural hematoma. No acute infarct apparent. Right frontal scalp hematoma.  CT cervical spine: Extensive spondylosis. Mild spondylolisthesis at most levels, felt to be due to spondylosis. No fracture apparent.   Electronically Signed   By: Lowella Grip III M.D.   On: 03/09/2015 10:47   Dg Hip Unilat With Pelvis 2-3 Views Right  03/11/2015   CLINICAL DATA:  Status post fall. Hit head on bed frame. Right hip pain and weakness. Initial encounter.  EXAM: RIGHT HIP (WITH PELVIS) 2-3 VIEWS  COMPARISON:  None.  FINDINGS: There is no evidence of fracture or dislocation. Both femoral heads are seated normally within their respective acetabula. The proximal right femur appears intact. Degenerative change is noted at the lower lumbar spine. The sacroiliac joints are unremarkable in appearance.  The visualized bowel gas pattern is grossly unremarkable in appearance. Diffuse vascular calcifications are seen.  IMPRESSION: 1. No evidence of fracture or dislocation. 2. Degenerative change at the lower lumbar spine. 3. Diffuse vascular calcifications seen.   Electronically Signed   By: Garald Balding M.D.   On: 03/11/2015 21:45    Microbiology: Recent Results (from the past 240 hour(s))  Urine culture     Status: None   Collection Time: 03/09/15 10:00 AM  Result Value Ref Range Status   Specimen Description URINE, CLEAN CATCH  Final   Special Requests  NONE  Final   Colony Count   Final    >=100,000 COLONIES/ML Performed at [facility]    Culture   Final    STAPHYLOCOCCUS SPECIES (COAGULASE NEGATIVE) Note: RIFAMPIN AND GENTAMICIN SHOULD NOT BE USED AS SINGLE DRUGS FOR TREATMENT OF STAPH INFECTIONS. Performed at [facility]    Report Status 03/12/2015 FINAL  Final   Organism ID, Bacteria STAPHYLOCOCCUS SPECIES (COAGULASE NEGATIVE)  Final      Susceptibility   Staphylococcus species (coagulase negative) -  MIC*    GENTAMICIN <=0.5 SENSITIVE Sensitive     LEVOFLOXACIN <=0.12 SENSITIVE Sensitive     NITROFURANTOIN <=16 SENSITIVE Sensitive     OXACILLIN <=0.25 SENSITIVE Sensitive     PENICILLIN >=0.5 RESISTANT Resistant     RIFAMPIN <=0.5 SENSITIVE Sensitive     TRIMETH/SULFA <=10 SENSITIVE Sensitive     VANCOMYCIN <=0.5 SENSITIVE Sensitive     TETRACYCLINE <=1 SENSITIVE Sensitive     * STAPHYLOCOCCUS SPECIES (COAGULASE NEGATIVE)  MRSA PCR Screening     Status: None   Collection Time: 03/12/15  4:32 AM  Result Value Ref Range Status   MRSA by PCR NEGATIVE NEGATIVE Final    Comment:        The GeneXpert MRSA Assay (FDA approved for NASAL specimens only), is one component of a comprehensive MRSA colonization surveillance program. It is not intended to diagnose MRSA infection nor to guide or monitor treatment for MRSA infections.   Culture, blood (routine x 2)     Status: None (Preliminary result)   Collection Time: 03/12/15  9:50 AM  Result Value Ref Range Status   Specimen Description BLOOD RIGHT HAND  Final   Special Requests BOTTLES DRAWN AEROBIC ONLY  3CC  Final   Culture   Final           BLOOD CULTURE RECEIVED NO GROWTH TO DATE CULTURE WILL BE HELD FOR 5 DAYS BEFORE ISSUING A FINAL NEGATIVE REPORT Performed at [facility]    Report Status PENDING  Incomplete  Culture, blood (routine x 2)     Status: None (Preliminary result)   Collection Time: 03/12/15 10:00 AM  Result Value Ref Range  Status   Specimen Description BLOOD LEFT HAND  Final   Special Requests BOTTLES DRAWN AEROBIC ONLY  3CC  Final   Culture   Final           BLOOD CULTURE RECEIVED NO GROWTH TO DATE CULTURE WILL BE HELD FOR 5 DAYS BEFORE ISSUING A FINAL NEGATIVE REPORT Performed at [facility]    Report Status PENDING  Incomplete     Labs: Basic Metabolic Panel:  Recent Labs Lab 03/12/15 1200  03/13/15 0300  03/13/15 1840 03/13/15 2034 03/14/15 0815 03/15/15 0333 03/16/15 0511  NA 118*  < > 120*  < > 123* 122* 125* 124* 128*  K 4.1  --  3.9  --   --   --  3.7 4.4 4.0  CL 92*  --  94*  --   --   --  96* 95* 98*  CO2 16*  --  18*  --   --   --  20* 19* 20*  GLUCOSE 93  --  88  --   --   --  115* 119* 109*  BUN 22  --  21*  --   --   --  23* 29* 26*  CREATININE 0.84  --  0.99  --   --   --  1.18 1.21 0.93  CALCIUM 7.8*  --  7.8*  --   --   --  8.1* 8.4* 8.3*  MG  --   --   --   --  1.9  --   --   --   --   < > = values in this interval not displayed. Liver Function Tests: No results for input(s): AST, ALT, ALKPHOS, BILITOT, PROT, ALBUMIN in the last 168 hours. No results for input(s): LIPASE, AMYLASE in the last 168 hours.  No results for input(s): AMMONIA in the last 168 hours. CBC:  Recent Labs Lab 03/11/15 2013  03/12/15 0523  03/13/15 0125 03/13/15 0840 03/13/15 1840 03/14/15 0815 03/15/15 0333  WBC 23.8*  --  19.5*  --   --   --   --  12.8* 10.4  NEUTROABS 21.2*  --   --   --   --   --   --   --   --   HGB 11.6*  < > 10.8*  < > 10.2* 10.2* 11.8* 11.2* 11.3*  HCT 31.7*  < > 29.6*  < > 28.0* 28.9* 32.3* 30.8* 31.6*  MCV 92.4  --  91.6  --   --   --   --  92.2 92.7  PLT 188  --  187  --   --   --   --  236 261  < > = values in this interval not displayed. Cardiac Enzymes: No results for input(s): CKTOTAL, CKMB, CKMBINDEX, TROPONINI in the last 168 hours. BNP: BNP (last 3 results) No results for input(s): BNP in the last 8760 hours.  ProBNP (last 3 results) No results  for input(s): PROBNP in the last 8760 hours.  CBG: No results for input(s): GLUCAP in the last 168 hours.     SignedCristal Ford  [facility] 03/16/2015, 2:16 PM

## 2015-03-16 NOTE — Progress Notes (Signed)
Physical Therapy Treatment Patient Details Name: [name] MRN: [id number] DOB: 1923/06/05 Today's Date: 03/16/2015    History of Present Illness Patient is a 81 year old male with A.Fib on Coumadin Rx presented to ED with generalized weakness, and in lower extremities, unable to walk. He was also having increased pain in his right hip since his fall on Wednesday 4/27. UGIB, left rib fxs, right facial brusing    PT Comments    Continuing progress with functional mobility, and pt is quite motivated to get better; Overall progressing well; Anticipate continuing good progress at post-acute rehabilitation.   Follow Up Recommendations  CIR;Other (comment) (short stent to get rid of the RW; Noted no IP Rehab beds available, so pt is ok with SNF for ST rehab at [facility])     Equipment Recommendations  3in1 (PT)    Recommendations for Other Services       Precautions / Restrictions Precautions Precautions: Fall    Mobility  Bed Mobility                  Transfers Overall transfer level: Needs assistance Equipment used: Rolling walker (2 wheeled) Transfers: Sit to/from Stand Sit to Stand: Min assist         General transfer comment: min from standard toilet, min guard from chair  Ambulation/Gait Ambulation/Gait assistance: Min guard Ambulation Distance (Feet): 120 Feet Assistive device: Rolling walker (2 wheeled)       General Gait Details: mildly unsteady throughout, needing cues and assist to maneuver the RW to get pt into the RW   Stairs            Wheelchair Mobility    Modified Rankin (Stroke Patients Only)       Balance     Sitting balance-Leahy Scale: Fair       Standing balance-Leahy Scale: Poor                      Cognition Arousal/Alertness: Awake/alert Behavior During Therapy: WFL for tasks assessed/performed Overall Cognitive Status: Within Functional Limits for tasks assessed                       Exercises      General Comments        Pertinent Vitals/Pain Pain Assessment: No/denies pain    Home Living                      Prior Function            PT Goals (current goals can now be found in the care plan section) Acute Rehab PT Goals Patient Stated Goal: preferably to go home PT Goal Formulation: With patient Time For Goal Achievement: 03/27/15 Potential to Achieve Goals: Good Progress towards PT goals: Progressing toward goals    Frequency  Min 3X/week    PT Plan Current plan remains appropriate    Co-evaluation             End of Session Equipment Utilized During Treatment: Gait belt Activity Tolerance: Patient tolerated treatment well Patient left: in bed;with call bell/phone within reach     Time: 1313-1335 PT Time Calculation (min) (ACUTE ONLY): 22 min  Charges:  $Gait Training: 8-22 mins                    G Codes:      Quin Hoop 03/16/2015, 4:16 PM  Roney Marion, PT  Acute Rehabilitation  Services Pager [phone] Office [phone]

## 2015-03-17 DIAGNOSIS — D500 Iron deficiency anemia secondary to blood loss (chronic): Secondary | ICD-10-CM | POA: Diagnosis not present

## 2015-03-17 DIAGNOSIS — K922 Gastrointestinal hemorrhage, unspecified: Secondary | ICD-10-CM | POA: Diagnosis not present

## 2015-03-17 DIAGNOSIS — I4891 Unspecified atrial fibrillation: Secondary | ICD-10-CM | POA: Diagnosis not present

## 2015-03-17 DIAGNOSIS — S2232XA Fracture of one rib, left side, initial encounter for closed fracture: Secondary | ICD-10-CM | POA: Diagnosis not present

## 2015-03-18 LAB — CULTURE, BLOOD (ROUTINE X 2)
Culture: NO GROWTH
Culture: NO GROWTH

## 2015-03-21 DIAGNOSIS — K922 Gastrointestinal hemorrhage, unspecified: Secondary | ICD-10-CM | POA: Diagnosis not present

## 2015-03-21 DIAGNOSIS — D689 Coagulation defect, unspecified: Secondary | ICD-10-CM | POA: Diagnosis not present

## 2015-03-23 DIAGNOSIS — D689 Coagulation defect, unspecified: Secondary | ICD-10-CM | POA: Diagnosis not present

## 2015-03-28 DIAGNOSIS — J060 Acute laryngopharyngitis: Secondary | ICD-10-CM | POA: Diagnosis not present

## 2015-03-28 DIAGNOSIS — J300 Vasomotor rhinitis: Secondary | ICD-10-CM | POA: Diagnosis not present

## 2015-03-28 DIAGNOSIS — E871 Hypo-osmolality and hyponatremia: Secondary | ICD-10-CM | POA: Diagnosis not present

## 2015-03-28 DIAGNOSIS — D649 Anemia, unspecified: Secondary | ICD-10-CM | POA: Diagnosis not present

## 2015-03-29 DIAGNOSIS — I10 Essential (primary) hypertension: Secondary | ICD-10-CM | POA: Diagnosis not present

## 2015-03-29 DIAGNOSIS — E785 Hyperlipidemia, unspecified: Secondary | ICD-10-CM | POA: Diagnosis not present

## 2015-03-29 DIAGNOSIS — I482 Chronic atrial fibrillation: Secondary | ICD-10-CM | POA: Diagnosis not present

## 2015-03-29 DIAGNOSIS — I4892 Unspecified atrial flutter: Secondary | ICD-10-CM | POA: Diagnosis not present

## 2015-03-29 DIAGNOSIS — Z7901 Long term (current) use of anticoagulants: Secondary | ICD-10-CM | POA: Diagnosis not present

## 2015-03-31 ENCOUNTER — Encounter (INDEPENDENT_AMBULATORY_CARE_PROVIDER_SITE_OTHER): Payer: Medicare Other | Admitting: Ophthalmology

## 2015-03-31 DIAGNOSIS — I10 Essential (primary) hypertension: Secondary | ICD-10-CM | POA: Diagnosis not present

## 2015-03-31 DIAGNOSIS — H43813 Vitreous degeneration, bilateral: Secondary | ICD-10-CM | POA: Diagnosis not present

## 2015-03-31 DIAGNOSIS — H3532 Exudative age-related macular degeneration: Secondary | ICD-10-CM

## 2015-03-31 DIAGNOSIS — H35033 Hypertensive retinopathy, bilateral: Secondary | ICD-10-CM | POA: Diagnosis not present

## 2015-03-31 DIAGNOSIS — H3531 Nonexudative age-related macular degeneration: Secondary | ICD-10-CM | POA: Diagnosis not present

## 2015-04-07 DIAGNOSIS — R1310 Dysphagia, unspecified: Secondary | ICD-10-CM | POA: Diagnosis not present

## 2015-04-07 DIAGNOSIS — K222 Esophageal obstruction: Secondary | ICD-10-CM | POA: Diagnosis not present

## 2015-04-18 DIAGNOSIS — Z7901 Long term (current) use of anticoagulants: Secondary | ICD-10-CM | POA: Diagnosis not present

## 2015-04-18 DIAGNOSIS — I4891 Unspecified atrial fibrillation: Secondary | ICD-10-CM | POA: Diagnosis not present

## 2015-04-21 DIAGNOSIS — Z7901 Long term (current) use of anticoagulants: Secondary | ICD-10-CM | POA: Diagnosis not present

## 2015-04-21 DIAGNOSIS — I4891 Unspecified atrial fibrillation: Secondary | ICD-10-CM | POA: Diagnosis not present

## 2015-04-27 ENCOUNTER — Encounter (INDEPENDENT_AMBULATORY_CARE_PROVIDER_SITE_OTHER): Payer: Medicare Other | Admitting: Ophthalmology

## 2015-04-27 DIAGNOSIS — H43813 Vitreous degeneration, bilateral: Secondary | ICD-10-CM

## 2015-04-27 DIAGNOSIS — I10 Essential (primary) hypertension: Secondary | ICD-10-CM

## 2015-04-27 DIAGNOSIS — H35033 Hypertensive retinopathy, bilateral: Secondary | ICD-10-CM | POA: Diagnosis not present

## 2015-04-27 DIAGNOSIS — H3531 Nonexudative age-related macular degeneration: Secondary | ICD-10-CM

## 2015-04-27 DIAGNOSIS — H3532 Exudative age-related macular degeneration: Secondary | ICD-10-CM

## 2015-05-02 ENCOUNTER — Other Ambulatory Visit: Payer: Self-pay | Admitting: Gastroenterology

## 2015-05-02 NOTE — Addendum Note (Signed)
Addended by: Wilford Corner on: 05/02/2015 11:51 AM   Modules accepted: Orders

## 2015-05-03 ENCOUNTER — Encounter (HOSPITAL_COMMUNITY): Payer: Self-pay | Admitting: *Deleted

## 2015-05-03 NOTE — Progress Notes (Signed)
Anesthesia Chart Review:  Pt is 81 year old male scheduled for esophagogastroduodenoscopy, balloon dilation on 05/04/2015 with Dr. Michail Sermon.   Pt is same day work up.   Cardiologist is Dr. Wynonia Lawman.  PMH includes: atrial fibrillation, RA, GERD. Current smoker. BMI 25.  Medications include: diltiazem, protonix, coumadin. Pt stopped coumadin 04/30/2015 for procedure. Pt reports he is no longer taking digoxin.   Pt hospitalized 4/29-03/16/2015 for upper GI bleed/erosive esophagitis, fall, rib fracture, scalp hematoma, hyponatremia, UTI, weakness. Pt had atrial tachycardia vs flutter while inpatient, saw Dr. Wynonia Lawman who placed him on digoxin for rate control. Pt reports he has seen Dr. Wynonia Lawman for f/u since discharge; attempting to get records.   EKG 03/13/2015: Atrial flutter with variable A-V block. Inferior infarct, age undetermined.   Echo 03/15/2015: - Left ventricle: The cavity size was normal. There was mild concentric hypertrophy. Systolic function was normal. The estimated ejection fraction was in the range of 55% to 60%. Wall motion was normal; there were no regional wall motion abnormalities. - Aortic valve: Trileaflet; normal thickness, mildly calcified leaflets. There was mild regurgitation. - Aortic root: The aortic root was mildly dilated. - Left atrium: The atrium was moderately dilated. - Right ventricle: The cavity size was mildly dilated. Wall thickness was normal. - Right atrium: The atrium was mildly dilated. - Pulmonary arteries: Systolic pressure was moderately increased. PA peak pressure: 48 mm Hg (S).  Labs to be drawn DOS. I ordered an Istat 4 to check sodium level since pt was severely hyponatremic during hospitalization in early May. If labs acceptable, I anticipate pt can proceed as scheduled.   Willeen Cass, FNP-BC [facility]/Anesthesiology Phone: [phone] 05/03/2015 3:01 PM

## 2015-05-03 NOTE — Progress Notes (Addendum)
[name] states that he has seen Cardiologist and PCP since he was discharged from the hospital in May.  I faxed a request to Dr Thurman Coyer office.  Patient denies chest pain or shortness of breath or lightheadedness.

## 2015-05-04 ENCOUNTER — Ambulatory Visit (HOSPITAL_COMMUNITY)
Admission: RE | Admit: 2015-05-04 | Discharge: 2015-05-04 | Disposition: A | Discharge status: Home / Self Care | Payer: Medicare Other | Source: Ambulatory Visit | Attending: Gastroenterology | Admitting: Gastroenterology

## 2015-05-04 ENCOUNTER — Ambulatory Visit (HOSPITAL_COMMUNITY): Payer: Medicare Other | Admitting: Emergency Medicine

## 2015-05-04 ENCOUNTER — Encounter (HOSPITAL_COMMUNITY): Payer: Self-pay | Admitting: *Deleted

## 2015-05-04 ENCOUNTER — Encounter (HOSPITAL_COMMUNITY)
Admission: RE | Disposition: A | Discharge status: Home / Self Care | Payer: Self-pay | Source: Ambulatory Visit | Attending: Gastroenterology

## 2015-05-04 DIAGNOSIS — K449 Diaphragmatic hernia without obstruction or gangrene: Secondary | ICD-10-CM | POA: Insufficient documentation

## 2015-05-04 DIAGNOSIS — R1310 Dysphagia, unspecified: Secondary | ICD-10-CM

## 2015-05-04 DIAGNOSIS — I4891 Unspecified atrial fibrillation: Secondary | ICD-10-CM | POA: Insufficient documentation

## 2015-05-04 DIAGNOSIS — K222 Esophageal obstruction: Secondary | ICD-10-CM | POA: Diagnosis not present

## 2015-05-04 HISTORY — PX: ESOPHAGOGASTRODUODENOSCOPY: SHX5428

## 2015-05-04 HISTORY — DX: Gastro-esophageal reflux disease without esophagitis: K21.9

## 2015-05-04 HISTORY — DX: Ulcer of esophagus without bleeding: K22.10

## 2015-05-04 HISTORY — PX: SAVORY DILATION: SHX5439

## 2015-05-04 HISTORY — PX: BALLOON DILATION: SHX5330

## 2015-05-04 HISTORY — DX: Cardiac arrhythmia, unspecified: I49.9

## 2015-05-04 HISTORY — DX: Personal history of (healed) traumatic fracture: Z87.81

## 2015-05-04 LAB — PROTIME-INR
INR: 1.45 (ref 0.00–1.49)
PROTHROMBIN TIME: 17.7 s — AB (ref 11.6–15.2)

## 2015-05-04 SURGERY — EGD (ESOPHAGOGASTRODUODENOSCOPY)
Anesthesia: General

## 2015-05-04 MED ORDER — LACTATED RINGERS IV SOLN
INTRAVENOUS | Status: DC
  Administered 2015-05-04: 1000 mL via INTRAVENOUS

## 2015-05-04 MED ORDER — SODIUM CHLORIDE 0.9 % IV SOLN: INTRAVENOUS | Status: DC

## 2015-05-04 MED ORDER — LIDOCAINE HCL (CARDIAC) 20 MG/ML IV SOLN
INTRAVENOUS | Status: DC | PRN
  Administered 2015-05-04: 60 mg via INTRAVENOUS

## 2015-05-04 MED ORDER — PROPOFOL 10 MG/ML IV BOLUS
INTRAVENOUS | Status: DC | PRN
  Administered 2015-05-04 (×4): 10 mg via INTRAVENOUS
  Administered 2015-05-04: 20 mg via INTRAVENOUS
  Administered 2015-05-04 (×2): 10 mg via INTRAVENOUS
  Administered 2015-05-04: 20 mg via INTRAVENOUS
  Administered 2015-05-04: 10 mg via INTRAVENOUS

## 2015-05-04 NOTE — Interval H&P Note (Signed)
History and Physical Interval Note:  05/04/2015 10:53 AM  [name]  has presented today for surgery, with the diagnosis of esphogeal stricher and disphansea  The various methods of treatment have been discussed with the patient and family. After consideration of risks, benefits and other options for treatment, the patient has consented to  Procedure(s) with comments: ESOPHAGOGASTRODUODENOSCOPY (EGD) (N/A) - carm BALLOON DILATION (N/A) - janie/melissa SAVORY DILATION (N/A) as a surgical intervention .  The patient's history has been reviewed, patient examined, no change in status, stable for surgery.  I have reviewed the patient's chart and labs.  Questions were answered to the patient's satisfaction.     [place] C.

## 2015-05-04 NOTE — Discharge Instructions (Addendum)
HOLD COUMADIN UNTIL Saturday May 07, 2015 and then resume at your previously recommended dose.     Monitored Anesthesia Care Monitored anesthesia care is an anesthesia service for a medical procedure. Anesthesia is the loss of the ability to feel pain. It is produced by medicines called anesthetics. It may affect a small area of your body (local anesthesia), a large area of your body (regional anesthesia), or your entire body (general anesthesia). The need for monitored anesthesia care depends your procedure, your condition, and the potential need for regional or general anesthesia. It is often provided during procedures where:   General anesthesia may be needed if there are complications. This is because you need special care when you are under general anesthesia.   You will be under local or regional anesthesia. This is so that you are able to have higher levels of anesthesia if needed.   You will receive calming medicines (sedatives). This is especially the case if sedatives are given to put you in a semi-conscious state of relaxation (deep sedation). This is because the amount of sedative needed to produce this state can be hard to predict. Too much of a sedative can produce general anesthesia. Monitored anesthesia care is performed by one or more health care providers who have special training in all types of anesthesia. You will need to meet with these health care providers before your procedure. During this meeting, they will ask you about your medical history. They will also give you instructions to follow. (For example, you will need to stop eating and drinking before your procedure. You may also need to stop or change medicines you are taking.) During your procedure, your health care providers will stay with you. They will:   Watch your condition. This includes watching your blood pressure, breathing, and level of pain.   Diagnose and treat problems that occur.   Give medicines if  they are needed. These may include calming medicines (sedatives) and anesthetics.   Make sure you are comfortable.  Having monitored anesthesia care does not necessarily mean that you will be under anesthesia. It does mean that your health care providers will be able to manage anesthesia if you need it or if it occurs. It also means that you will be able to have a different type of anesthesia than you are having if you need it. When your procedure is complete, your health care providers will continue to watch your condition. They will make sure any medicines wear off before you are allowed to go home.  Document Released: 07/25/2005 Document Revised: 03/15/2014 Document Reviewed: 12/10/2012 [facility] Patient Information 2015 [place], [place]. This information is not intended to replace advice given to you by your health care provider. Make sure you discuss any questions you have with your health care provider. Esophagogastroduodenoscopy Esophagogastroduodenoscopy (EGD) is a procedure to examine the lining of the esophagus, stomach, and first part of the small intestine (duodenum). A long, flexible, lighted tube with a camera attached (endoscope) is inserted down the throat to view these organs. This procedure is done to detect problems or abnormalities, such as inflammation, bleeding, ulcers, or growths, in order to treat them. The procedure lasts about 5-20 minutes. It is usually an outpatient procedure, but it may need to be performed in emergency cases in the hospital. LET YOUR CAREGIVER KNOW ABOUT:   Allergies to food or medicine.  All medicines you are taking, including vitamins, herbs, eyedrops, and over-the-counter medicines and creams.  Use of steroids (by mouth or  creams).  Previous problems you or members of your family have had with the use of anesthetics.  Any blood disorders you have.  Previous surgeries you have had.  Other health problems you have.  Possibility of pregnancy, if  this applies. RISKS AND COMPLICATIONS  Generally, EGD is a safe procedure. However, as with any procedure, complications can occur. Possible complications include:  Infection.  Bleeding.  Tearing (perforation) of the esophagus, stomach, or duodenum.  Difficulty breathing or not being able to breath.  Excessive sweating.  Spasms of the larynx.  Slowed heartbeat.  Low blood pressure. BEFORE THE PROCEDURE  Do not eat or drink anything for 6-8 hours before the procedure or as directed by your caregiver.  Ask your caregiver about changing or stopping your regular medicines.  If you wear dentures, be prepared to remove them before the procedure.  Arrange for someone to drive you home after the procedure. PROCEDURE   A vein will be accessed to give medicines and fluids. A medicine to relax you (sedative) and a pain reliever will be given through that access into the vein.  A numbing medicine (local anesthetic) may be sprayed on your throat for comfort and to stop you from gagging or coughing.  A mouth guard may be placed in your mouth to protect your teeth and to keep you from biting on the endoscope.  You will be asked to lie on your left side.  The endoscope is inserted down your throat and into the esophagus, stomach, and duodenum.  Air is put through the endoscope to allow your caregiver to view the lining of your esophagus clearly.  The esophagus, stomach, and duodenum is then examined. During the exam, your caregiver may:  Remove tissue to be examined under a microscope (biopsy) for inflammation, infection, or other medical problems.  Remove growths.  Remove objects (foreign bodies) that are stuck.  Treat any bleeding with medicines or other devices that stop tissues from bleeding (hot cautery, clipping devices).  Widen (dilate) or stretch narrowed areas of the esophagus and stomach.  The endoscope will then be withdrawn. AFTER THE PROCEDURE  You will be taken  to a recovery area to be monitored. You will be able to go home once you are stable and alert.  Do not eat or drink anything until the local anesthetic and numbing medicines have worn off. You may choke.  It is normal to feel bloated, have pain with swallowing, or have a sore throat for a short time. This will wear off.  Your caregiver should be able to discuss his or her findings with you. It will take longer to discuss the test results if any biopsies were taken. Document Released: 03/01/2005 Document Revised: 03/15/2014 Document Reviewed: 10/01/2012 [facility] Patient Information 2015 [place], [place]. This information is not intended to replace advice given to you by your health care provider. Make sure you discuss any questions you have with your health care provider.

## 2015-05-04 NOTE — H&P (Signed)
  Date of Initial H&P: 04/07/15  History reviewed, patient examined, no change in status, stable for surgery.

## 2015-05-04 NOTE — Progress Notes (Signed)
Pt's HR dropping into the 30's at times with atrial fibrillation present post EGD in recovery. Pt denies being dizzy or light headed and can "think clearly." pt's B/P and Spo2 within normal pre procedure range while in recovery. Dr. Wynonia Lawman contacted. Per Dr. Wynonia Lawman the pt is in chronic atrial fibrillation and would like for him to decrease his metoprolol from 2 times a day to 1 time a day. Pt, pt's wife and pt's son aware of Dr. Thurman Coyer instructions and verbalize understanding. Pt also aware to schedule f/u appt with Dr. Wynonia Lawman as soon as possible. Jobe Igo, RN

## 2015-05-04 NOTE — Anesthesia Preprocedure Evaluation (Signed)
Anesthesia Evaluation  Patient identified by MRN, date of birth, ID band Patient awake    Reviewed: Allergy & Precautions, NPO status , Patient's Chart, lab work & pertinent test results  Airway Mallampati: II  TM Distance: >3 FB     Dental  (+) Edentulous Upper, Edentulous Lower   Pulmonary Current Smoker,  breath sounds clear to auscultation        Cardiovascular Rhythm:Irregular Rate:Normal     Neuro/Psych    GI/Hepatic   Endo/Other    Renal/GU      Musculoskeletal   Abdominal   Peds  Hematology   Anesthesia Other Findings   Reproductive/Obstetrics                             Anesthesia Physical Anesthesia Plan  ASA: III  Anesthesia Plan: General   Post-op Pain Management:    Induction: Intravenous  Airway Management Planned: Natural Airway and Simple Face Mask  Additional Equipment:   Intra-op Plan:   Post-operative Plan:   Informed Consent: I have reviewed the patients History and Physical, chart, labs and discussed the procedure including the risks, benefits and alternatives for the proposed anesthesia with the patient or authorized representative who has indicated his/her understanding and acceptance.     Plan Discussed with: CRNA and Anesthesiologist  Anesthesia Plan Comments:         Anesthesia Quick Evaluation

## 2015-05-04 NOTE — Op Note (Signed)
[facility] [place], [id number]   ENDOSCOPY PROCEDURE REPORT  PATIENT: [name], [name]  MR#: [id number] BIRTHDATE: 1922/11/17 , 89  yrs. old GENDER: male ENDOSCOPIST: Wilford Corner, MD REFERRED BY: PROCEDURE DATE:  2015/05/13 PROCEDURE:  EGD w/ balloon dilation ASA CLASS:     Class III INDICATIONS:  dysphagia and esophageal stricture. MEDICATIONS: Monitored anesthesia care and Per Anesthesia TOPICAL ANESTHETIC:  DESCRIPTION OF PROCEDURE: After the risks benefits and alternatives of the procedure were thoroughly explained, informed consent was obtained.  The Pentax Gastroscope [id number] endoscope was introduced through the mouth and advanced to the second portion of the duodenum , Without limitations.  The instrument was slowly withdrawn as the mucosa was fully examined. Estimated blood loss is zero unless otherwise noted in this procedure report.    Benign-appearing this mildly obstructing distal esophageal ring (at GEJ which is 42 cm from the incisors). Endoscope (9.8 diameter) passed into stomach with minimal resistance. Small hiatal hernia seen on retroflexion otherwise stomach normal. Duodenal bulb and 2nd portion of the duodenum normal. A TTS balloon used to dilate the ring in sequential fashion starting at 12 mm (no blood following dilation; ring intact), 13.5 mm (no blood following dilation; ring intact), 15 mm (blood noted following dilation), 16.5 mm (blood noted and ring successfully dilated). The scope was then withdrawn from the patient and the procedure completed.  COMPLICATIONS: There were no immediate complications.  ENDOSCOPIC IMPRESSION:     Distal Esophageal Ring - s/p balloon dilation to 16.5 mm Small hiatal hernia  RECOMMENDATIONS:     Continue to hold Coumadin for another 3 days; Liquids and soft solids and advance as tolerated   eSigned:  Wilford Corner, MD 2015/05/13 11:35 AM    CC:  CPT  CODES: ICD CODES:  The ICD and CPT codes recommended by this software are interpretations from the data that the clinical staff has captured with the software.  The verification of the translation of this report to the ICD and CPT codes and modifiers is the sole responsibility of the health care institution and practicing physician where this report was generated.  [employer] will not be held responsible for the validity of the ICD and CPT codes included on this report.  AMA assumes no liability for data contained or not contained herein. CPT is a Designer, television/film set of the [employer].  PATIENT NAME:  [name], [name] MR#: [id number]

## 2015-05-04 NOTE — Anesthesia Postprocedure Evaluation (Signed)
  Anesthesia Post-op Note  Patient: [name]  Procedure(s) Performed: Procedure(s) with comments: ESOPHAGOGASTRODUODENOSCOPY (EGD) (N/A) - carm BALLOON DILATION (N/A) - janie/melissa SAVORY DILATION (N/A)  Patient Location: PACU  Anesthesia Type:General  Level of Consciousness: awake, alert  and oriented  Airway and Oxygen Therapy: Patient Spontanous Breathing and Patient connected to nasal cannula oxygen  Post-op Pain: none  Post-op Assessment: Post-op Vital signs reviewed, Patient's Cardiovascular Status Stable, Respiratory Function Stable, Patent Airway and Pain level controlled              Post-op Vital Signs: stable  Last Vitals:  Filed Vitals:   05/04/15 1224  BP: 140/62  Pulse: 53  Temp:   Resp: 17    Complications: No apparent anesthesia complications

## 2015-05-04 NOTE — Transfer of Care (Signed)
Immediate Anesthesia Transfer of Care Note  Patient: [name]  Procedure(s) Performed: Procedure(s) with comments: ESOPHAGOGASTRODUODENOSCOPY (EGD) (N/A) - carm BALLOON DILATION (N/A) - janie/melissa SAVORY DILATION (N/A)  Patient Location: Endoscopy Unit  Anesthesia Type:MAC  Level of Consciousness: lethargic  Airway & Oxygen Therapy: Patient Spontanous Breathing and Patient connected to nasal cannula oxygen  Post-op Assessment: Report given to RN  Post vital signs: Reviewed and stable  Last Vitals:  Filed Vitals:   05/04/15 0838  BP: 147/77  Temp: 36.6 C  Resp: 15    Complications: No apparent anesthesia complications

## 2015-05-05 ENCOUNTER — Encounter (HOSPITAL_COMMUNITY): Payer: Self-pay | Admitting: Gastroenterology

## 2015-05-09 DIAGNOSIS — I4891 Unspecified atrial fibrillation: Secondary | ICD-10-CM | POA: Diagnosis not present

## 2015-05-09 DIAGNOSIS — Z7901 Long term (current) use of anticoagulants: Secondary | ICD-10-CM | POA: Diagnosis not present

## 2015-05-11 DIAGNOSIS — I4892 Unspecified atrial flutter: Secondary | ICD-10-CM | POA: Diagnosis not present

## 2015-05-11 DIAGNOSIS — Z7901 Long term (current) use of anticoagulants: Secondary | ICD-10-CM | POA: Diagnosis not present

## 2015-05-11 DIAGNOSIS — E785 Hyperlipidemia, unspecified: Secondary | ICD-10-CM | POA: Diagnosis not present

## 2015-05-11 DIAGNOSIS — I10 Essential (primary) hypertension: Secondary | ICD-10-CM | POA: Diagnosis not present

## 2015-05-11 DIAGNOSIS — I482 Chronic atrial fibrillation: Secondary | ICD-10-CM | POA: Diagnosis not present

## 2015-05-17 DIAGNOSIS — Z0000 Encounter for general adult medical examination without abnormal findings: Secondary | ICD-10-CM | POA: Diagnosis not present

## 2015-05-17 DIAGNOSIS — I10 Essential (primary) hypertension: Secondary | ICD-10-CM | POA: Diagnosis not present

## 2015-05-17 DIAGNOSIS — I4891 Unspecified atrial fibrillation: Secondary | ICD-10-CM | POA: Diagnosis not present

## 2015-05-17 DIAGNOSIS — R5383 Other fatigue: Secondary | ICD-10-CM | POA: Diagnosis not present

## 2015-05-24 DIAGNOSIS — D500 Iron deficiency anemia secondary to blood loss (chronic): Secondary | ICD-10-CM | POA: Diagnosis not present

## 2015-05-24 DIAGNOSIS — M150 Primary generalized (osteo)arthritis: Secondary | ICD-10-CM | POA: Diagnosis not present

## 2015-05-24 DIAGNOSIS — I10 Essential (primary) hypertension: Secondary | ICD-10-CM | POA: Diagnosis not present

## 2015-05-24 DIAGNOSIS — I4891 Unspecified atrial fibrillation: Secondary | ICD-10-CM | POA: Diagnosis not present

## 2015-05-25 ENCOUNTER — Encounter (INDEPENDENT_AMBULATORY_CARE_PROVIDER_SITE_OTHER): Payer: Medicare Other | Admitting: Ophthalmology

## 2015-05-25 DIAGNOSIS — H3531 Nonexudative age-related macular degeneration: Secondary | ICD-10-CM | POA: Diagnosis not present

## 2015-05-25 DIAGNOSIS — H43813 Vitreous degeneration, bilateral: Secondary | ICD-10-CM

## 2015-05-25 DIAGNOSIS — H3532 Exudative age-related macular degeneration: Secondary | ICD-10-CM

## 2015-05-25 DIAGNOSIS — I10 Essential (primary) hypertension: Secondary | ICD-10-CM

## 2015-05-25 DIAGNOSIS — H35033 Hypertensive retinopathy, bilateral: Secondary | ICD-10-CM

## 2015-05-26 ENCOUNTER — Ambulatory Visit (INDEPENDENT_AMBULATORY_CARE_PROVIDER_SITE_OTHER): Payer: Medicare Other | Admitting: Ophthalmology

## 2015-05-30 DIAGNOSIS — I4891 Unspecified atrial fibrillation: Secondary | ICD-10-CM | POA: Diagnosis not present

## 2015-05-30 DIAGNOSIS — Z7901 Long term (current) use of anticoagulants: Secondary | ICD-10-CM | POA: Diagnosis not present

## 2015-06-16 DIAGNOSIS — K222 Esophageal obstruction: Secondary | ICD-10-CM | POA: Diagnosis not present

## 2015-06-22 ENCOUNTER — Encounter (INDEPENDENT_AMBULATORY_CARE_PROVIDER_SITE_OTHER): Payer: Medicare Other | Admitting: Ophthalmology

## 2015-06-22 DIAGNOSIS — H3531 Nonexudative age-related macular degeneration: Secondary | ICD-10-CM

## 2015-06-22 DIAGNOSIS — I10 Essential (primary) hypertension: Secondary | ICD-10-CM | POA: Diagnosis not present

## 2015-06-22 DIAGNOSIS — H43813 Vitreous degeneration, bilateral: Secondary | ICD-10-CM | POA: Diagnosis not present

## 2015-06-22 DIAGNOSIS — H3532 Exudative age-related macular degeneration: Secondary | ICD-10-CM | POA: Diagnosis not present

## 2015-06-22 DIAGNOSIS — H35033 Hypertensive retinopathy, bilateral: Secondary | ICD-10-CM | POA: Diagnosis not present

## 2015-06-27 DIAGNOSIS — Z7901 Long term (current) use of anticoagulants: Secondary | ICD-10-CM | POA: Diagnosis not present

## 2015-06-27 DIAGNOSIS — I4891 Unspecified atrial fibrillation: Secondary | ICD-10-CM | POA: Diagnosis not present

## 2015-07-20 ENCOUNTER — Encounter (INDEPENDENT_AMBULATORY_CARE_PROVIDER_SITE_OTHER): Payer: Medicare Other | Admitting: Ophthalmology

## 2015-07-20 DIAGNOSIS — H3532 Exudative age-related macular degeneration: Secondary | ICD-10-CM | POA: Diagnosis not present

## 2015-07-20 DIAGNOSIS — H43813 Vitreous degeneration, bilateral: Secondary | ICD-10-CM | POA: Diagnosis not present

## 2015-07-20 DIAGNOSIS — H3531 Nonexudative age-related macular degeneration: Secondary | ICD-10-CM

## 2015-07-20 DIAGNOSIS — I10 Essential (primary) hypertension: Secondary | ICD-10-CM

## 2015-07-20 DIAGNOSIS — H35033 Hypertensive retinopathy, bilateral: Secondary | ICD-10-CM | POA: Diagnosis not present

## 2015-07-25 DIAGNOSIS — Z7901 Long term (current) use of anticoagulants: Secondary | ICD-10-CM | POA: Diagnosis not present

## 2015-07-25 DIAGNOSIS — I4891 Unspecified atrial fibrillation: Secondary | ICD-10-CM | POA: Diagnosis not present

## 2015-08-04 DIAGNOSIS — M0589 Other rheumatoid arthritis with rheumatoid factor of multiple sites: Secondary | ICD-10-CM | POA: Diagnosis not present

## 2015-08-11 DIAGNOSIS — Z7901 Long term (current) use of anticoagulants: Secondary | ICD-10-CM | POA: Diagnosis not present

## 2015-08-11 DIAGNOSIS — E785 Hyperlipidemia, unspecified: Secondary | ICD-10-CM | POA: Diagnosis not present

## 2015-08-11 DIAGNOSIS — I10 Essential (primary) hypertension: Secondary | ICD-10-CM | POA: Diagnosis not present

## 2015-08-11 DIAGNOSIS — I4892 Unspecified atrial flutter: Secondary | ICD-10-CM | POA: Diagnosis not present

## 2015-08-11 DIAGNOSIS — I482 Chronic atrial fibrillation: Secondary | ICD-10-CM | POA: Diagnosis not present

## 2015-08-17 ENCOUNTER — Encounter (INDEPENDENT_AMBULATORY_CARE_PROVIDER_SITE_OTHER): Payer: Medicare Other | Admitting: Ophthalmology

## 2015-08-17 DIAGNOSIS — H43813 Vitreous degeneration, bilateral: Secondary | ICD-10-CM | POA: Diagnosis not present

## 2015-08-17 DIAGNOSIS — I10 Essential (primary) hypertension: Secondary | ICD-10-CM | POA: Diagnosis not present

## 2015-08-17 DIAGNOSIS — H353122 Nonexudative age-related macular degeneration, left eye, intermediate dry stage: Secondary | ICD-10-CM

## 2015-08-17 DIAGNOSIS — H353211 Exudative age-related macular degeneration, right eye, with active choroidal neovascularization: Secondary | ICD-10-CM | POA: Diagnosis not present

## 2015-08-17 DIAGNOSIS — H35033 Hypertensive retinopathy, bilateral: Secondary | ICD-10-CM

## 2015-08-18 DIAGNOSIS — Z85828 Personal history of other malignant neoplasm of skin: Secondary | ICD-10-CM | POA: Diagnosis not present

## 2015-08-18 DIAGNOSIS — L720 Epidermal cyst: Secondary | ICD-10-CM | POA: Diagnosis not present

## 2015-08-18 DIAGNOSIS — L570 Actinic keratosis: Secondary | ICD-10-CM | POA: Diagnosis not present

## 2015-08-18 DIAGNOSIS — D1801 Hemangioma of skin and subcutaneous tissue: Secondary | ICD-10-CM | POA: Diagnosis not present

## 2015-08-18 DIAGNOSIS — D692 Other nonthrombocytopenic purpura: Secondary | ICD-10-CM | POA: Diagnosis not present

## 2015-08-18 DIAGNOSIS — L821 Other seborrheic keratosis: Secondary | ICD-10-CM | POA: Diagnosis not present

## 2015-08-22 DIAGNOSIS — I4891 Unspecified atrial fibrillation: Secondary | ICD-10-CM | POA: Diagnosis not present

## 2015-08-22 DIAGNOSIS — Z7901 Long term (current) use of anticoagulants: Secondary | ICD-10-CM | POA: Diagnosis not present

## 2015-08-22 DIAGNOSIS — Z23 Encounter for immunization: Secondary | ICD-10-CM | POA: Diagnosis not present

## 2015-09-14 ENCOUNTER — Encounter (INDEPENDENT_AMBULATORY_CARE_PROVIDER_SITE_OTHER): Payer: Medicare Other | Admitting: Ophthalmology

## 2015-09-14 DIAGNOSIS — I10 Essential (primary) hypertension: Secondary | ICD-10-CM | POA: Diagnosis not present

## 2015-09-14 DIAGNOSIS — H35033 Hypertensive retinopathy, bilateral: Secondary | ICD-10-CM

## 2015-09-14 DIAGNOSIS — H353211 Exudative age-related macular degeneration, right eye, with active choroidal neovascularization: Secondary | ICD-10-CM | POA: Diagnosis not present

## 2015-09-14 DIAGNOSIS — H353124 Nonexudative age-related macular degeneration, left eye, advanced atrophic with subfoveal involvement: Secondary | ICD-10-CM

## 2015-09-14 DIAGNOSIS — H43813 Vitreous degeneration, bilateral: Secondary | ICD-10-CM | POA: Diagnosis not present

## 2015-09-15 ENCOUNTER — Encounter (INDEPENDENT_AMBULATORY_CARE_PROVIDER_SITE_OTHER): Payer: Medicare Other | Admitting: Ophthalmology

## 2015-09-19 DIAGNOSIS — I4891 Unspecified atrial fibrillation: Secondary | ICD-10-CM | POA: Diagnosis not present

## 2015-09-19 DIAGNOSIS — Z7901 Long term (current) use of anticoagulants: Secondary | ICD-10-CM | POA: Diagnosis not present

## 2015-09-20 DIAGNOSIS — I10 Essential (primary) hypertension: Secondary | ICD-10-CM | POA: Diagnosis not present

## 2015-09-20 DIAGNOSIS — D500 Iron deficiency anemia secondary to blood loss (chronic): Secondary | ICD-10-CM | POA: Diagnosis not present

## 2015-09-27 DIAGNOSIS — M150 Primary generalized (osteo)arthritis: Secondary | ICD-10-CM | POA: Diagnosis not present

## 2015-09-27 DIAGNOSIS — I4891 Unspecified atrial fibrillation: Secondary | ICD-10-CM | POA: Diagnosis not present

## 2015-09-27 DIAGNOSIS — I10 Essential (primary) hypertension: Secondary | ICD-10-CM | POA: Diagnosis not present

## 2015-09-27 DIAGNOSIS — M069 Rheumatoid arthritis, unspecified: Secondary | ICD-10-CM | POA: Diagnosis not present

## 2015-10-12 ENCOUNTER — Encounter (INDEPENDENT_AMBULATORY_CARE_PROVIDER_SITE_OTHER): Payer: Medicare Other | Admitting: Ophthalmology

## 2015-10-12 DIAGNOSIS — H43813 Vitreous degeneration, bilateral: Secondary | ICD-10-CM

## 2015-10-12 DIAGNOSIS — H35033 Hypertensive retinopathy, bilateral: Secondary | ICD-10-CM | POA: Diagnosis not present

## 2015-10-12 DIAGNOSIS — I10 Essential (primary) hypertension: Secondary | ICD-10-CM | POA: Diagnosis not present

## 2015-10-12 DIAGNOSIS — H353124 Nonexudative age-related macular degeneration, left eye, advanced atrophic with subfoveal involvement: Secondary | ICD-10-CM | POA: Diagnosis not present

## 2015-10-12 DIAGNOSIS — H353211 Exudative age-related macular degeneration, right eye, with active choroidal neovascularization: Secondary | ICD-10-CM | POA: Diagnosis not present

## 2015-10-13 HISTORY — PX: EYE SURGERY: SHX253

## 2015-10-17 DIAGNOSIS — Z7901 Long term (current) use of anticoagulants: Secondary | ICD-10-CM | POA: Diagnosis not present

## 2015-10-17 DIAGNOSIS — I4891 Unspecified atrial fibrillation: Secondary | ICD-10-CM | POA: Diagnosis not present

## 2015-11-14 ENCOUNTER — Encounter (INDEPENDENT_AMBULATORY_CARE_PROVIDER_SITE_OTHER): Payer: Medicare Other | Admitting: Ophthalmology

## 2015-11-14 DIAGNOSIS — I10 Essential (primary) hypertension: Secondary | ICD-10-CM

## 2015-11-14 DIAGNOSIS — H35033 Hypertensive retinopathy, bilateral: Secondary | ICD-10-CM

## 2015-11-14 DIAGNOSIS — H43813 Vitreous degeneration, bilateral: Secondary | ICD-10-CM

## 2015-11-14 DIAGNOSIS — H353122 Nonexudative age-related macular degeneration, left eye, intermediate dry stage: Secondary | ICD-10-CM

## 2015-11-14 DIAGNOSIS — H353211 Exudative age-related macular degeneration, right eye, with active choroidal neovascularization: Secondary | ICD-10-CM

## 2015-11-15 DIAGNOSIS — Z7901 Long term (current) use of anticoagulants: Secondary | ICD-10-CM | POA: Diagnosis not present

## 2015-11-15 DIAGNOSIS — I4891 Unspecified atrial fibrillation: Secondary | ICD-10-CM | POA: Diagnosis not present

## 2015-12-15 DIAGNOSIS — I4891 Unspecified atrial fibrillation: Secondary | ICD-10-CM | POA: Diagnosis not present

## 2015-12-15 DIAGNOSIS — Z7901 Long term (current) use of anticoagulants: Secondary | ICD-10-CM | POA: Diagnosis not present

## 2015-12-19 ENCOUNTER — Encounter (INDEPENDENT_AMBULATORY_CARE_PROVIDER_SITE_OTHER): Payer: Medicare Other | Admitting: Ophthalmology

## 2015-12-19 DIAGNOSIS — H353211 Exudative age-related macular degeneration, right eye, with active choroidal neovascularization: Secondary | ICD-10-CM

## 2015-12-19 DIAGNOSIS — I10 Essential (primary) hypertension: Secondary | ICD-10-CM | POA: Diagnosis not present

## 2015-12-19 DIAGNOSIS — H353124 Nonexudative age-related macular degeneration, left eye, advanced atrophic with subfoveal involvement: Secondary | ICD-10-CM | POA: Diagnosis not present

## 2015-12-19 DIAGNOSIS — H35033 Hypertensive retinopathy, bilateral: Secondary | ICD-10-CM | POA: Diagnosis not present

## 2015-12-19 DIAGNOSIS — H43813 Vitreous degeneration, bilateral: Secondary | ICD-10-CM

## 2016-01-12 DIAGNOSIS — I4891 Unspecified atrial fibrillation: Secondary | ICD-10-CM | POA: Diagnosis not present

## 2016-01-12 DIAGNOSIS — Z7901 Long term (current) use of anticoagulants: Secondary | ICD-10-CM | POA: Diagnosis not present

## 2016-01-23 ENCOUNTER — Encounter (INDEPENDENT_AMBULATORY_CARE_PROVIDER_SITE_OTHER): Payer: Medicare Other | Admitting: Ophthalmology

## 2016-01-23 DIAGNOSIS — H35033 Hypertensive retinopathy, bilateral: Secondary | ICD-10-CM

## 2016-01-23 DIAGNOSIS — I10 Essential (primary) hypertension: Secondary | ICD-10-CM | POA: Diagnosis not present

## 2016-01-23 DIAGNOSIS — H353211 Exudative age-related macular degeneration, right eye, with active choroidal neovascularization: Secondary | ICD-10-CM

## 2016-01-23 DIAGNOSIS — H353124 Nonexudative age-related macular degeneration, left eye, advanced atrophic with subfoveal involvement: Secondary | ICD-10-CM

## 2016-01-23 DIAGNOSIS — H43813 Vitreous degeneration, bilateral: Secondary | ICD-10-CM | POA: Diagnosis not present

## 2016-02-02 DIAGNOSIS — M0589 Other rheumatoid arthritis with rheumatoid factor of multiple sites: Secondary | ICD-10-CM | POA: Diagnosis not present

## 2016-02-09 DIAGNOSIS — Z7901 Long term (current) use of anticoagulants: Secondary | ICD-10-CM | POA: Diagnosis not present

## 2016-02-09 DIAGNOSIS — I10 Essential (primary) hypertension: Secondary | ICD-10-CM | POA: Diagnosis not present

## 2016-02-09 DIAGNOSIS — I482 Chronic atrial fibrillation: Secondary | ICD-10-CM | POA: Diagnosis not present

## 2016-02-09 DIAGNOSIS — I4892 Unspecified atrial flutter: Secondary | ICD-10-CM | POA: Diagnosis not present

## 2016-02-09 DIAGNOSIS — E785 Hyperlipidemia, unspecified: Secondary | ICD-10-CM | POA: Diagnosis not present

## 2016-02-13 DIAGNOSIS — I4891 Unspecified atrial fibrillation: Secondary | ICD-10-CM | POA: Diagnosis not present

## 2016-02-13 DIAGNOSIS — Z7901 Long term (current) use of anticoagulants: Secondary | ICD-10-CM | POA: Diagnosis not present

## 2016-02-17 DIAGNOSIS — Z85828 Personal history of other malignant neoplasm of skin: Secondary | ICD-10-CM | POA: Diagnosis not present

## 2016-02-17 DIAGNOSIS — D692 Other nonthrombocytopenic purpura: Secondary | ICD-10-CM | POA: Diagnosis not present

## 2016-02-17 DIAGNOSIS — L821 Other seborrheic keratosis: Secondary | ICD-10-CM | POA: Diagnosis not present

## 2016-02-17 DIAGNOSIS — D3617 Benign neoplasm of peripheral nerves and autonomic nervous system of trunk, unspecified: Secondary | ICD-10-CM | POA: Diagnosis not present

## 2016-02-17 DIAGNOSIS — L570 Actinic keratosis: Secondary | ICD-10-CM | POA: Diagnosis not present

## 2016-03-05 ENCOUNTER — Encounter (INDEPENDENT_AMBULATORY_CARE_PROVIDER_SITE_OTHER): Payer: Medicare Other | Admitting: Ophthalmology

## 2016-03-05 DIAGNOSIS — H43813 Vitreous degeneration, bilateral: Secondary | ICD-10-CM

## 2016-03-05 DIAGNOSIS — H353124 Nonexudative age-related macular degeneration, left eye, advanced atrophic with subfoveal involvement: Secondary | ICD-10-CM | POA: Diagnosis not present

## 2016-03-05 DIAGNOSIS — H353211 Exudative age-related macular degeneration, right eye, with active choroidal neovascularization: Secondary | ICD-10-CM | POA: Diagnosis not present

## 2016-03-05 DIAGNOSIS — I10 Essential (primary) hypertension: Secondary | ICD-10-CM

## 2016-03-05 DIAGNOSIS — H35033 Hypertensive retinopathy, bilateral: Secondary | ICD-10-CM

## 2016-03-13 DIAGNOSIS — I4891 Unspecified atrial fibrillation: Secondary | ICD-10-CM | POA: Diagnosis not present

## 2016-03-13 DIAGNOSIS — Z7901 Long term (current) use of anticoagulants: Secondary | ICD-10-CM | POA: Diagnosis not present

## 2016-04-12 DIAGNOSIS — I4891 Unspecified atrial fibrillation: Secondary | ICD-10-CM | POA: Diagnosis not present

## 2016-04-12 DIAGNOSIS — Z7901 Long term (current) use of anticoagulants: Secondary | ICD-10-CM | POA: Diagnosis not present

## 2016-04-23 ENCOUNTER — Encounter (INDEPENDENT_AMBULATORY_CARE_PROVIDER_SITE_OTHER): Payer: Medicare Other | Admitting: Ophthalmology

## 2016-04-23 DIAGNOSIS — H35033 Hypertensive retinopathy, bilateral: Secondary | ICD-10-CM

## 2016-04-23 DIAGNOSIS — H353122 Nonexudative age-related macular degeneration, left eye, intermediate dry stage: Secondary | ICD-10-CM

## 2016-04-23 DIAGNOSIS — H43813 Vitreous degeneration, bilateral: Secondary | ICD-10-CM

## 2016-04-23 DIAGNOSIS — I10 Essential (primary) hypertension: Secondary | ICD-10-CM

## 2016-04-23 DIAGNOSIS — H353211 Exudative age-related macular degeneration, right eye, with active choroidal neovascularization: Secondary | ICD-10-CM | POA: Diagnosis not present

## 2016-05-17 DIAGNOSIS — I4891 Unspecified atrial fibrillation: Secondary | ICD-10-CM | POA: Diagnosis not present

## 2016-05-17 DIAGNOSIS — Z7901 Long term (current) use of anticoagulants: Secondary | ICD-10-CM | POA: Diagnosis not present

## 2016-05-29 DIAGNOSIS — I10 Essential (primary) hypertension: Secondary | ICD-10-CM | POA: Diagnosis not present

## 2016-05-29 DIAGNOSIS — M150 Primary generalized (osteo)arthritis: Secondary | ICD-10-CM | POA: Diagnosis not present

## 2016-05-29 DIAGNOSIS — M069 Rheumatoid arthritis, unspecified: Secondary | ICD-10-CM | POA: Diagnosis not present

## 2016-05-29 DIAGNOSIS — Z0000 Encounter for general adult medical examination without abnormal findings: Secondary | ICD-10-CM | POA: Diagnosis not present

## 2016-05-29 DIAGNOSIS — I4891 Unspecified atrial fibrillation: Secondary | ICD-10-CM | POA: Diagnosis not present

## 2016-06-04 ENCOUNTER — Encounter (INDEPENDENT_AMBULATORY_CARE_PROVIDER_SITE_OTHER): Payer: Medicare Other | Admitting: Ophthalmology

## 2016-06-04 DIAGNOSIS — I10 Essential (primary) hypertension: Secondary | ICD-10-CM | POA: Diagnosis not present

## 2016-06-04 DIAGNOSIS — H353122 Nonexudative age-related macular degeneration, left eye, intermediate dry stage: Secondary | ICD-10-CM | POA: Diagnosis not present

## 2016-06-04 DIAGNOSIS — H353211 Exudative age-related macular degeneration, right eye, with active choroidal neovascularization: Secondary | ICD-10-CM | POA: Diagnosis not present

## 2016-06-04 DIAGNOSIS — H35033 Hypertensive retinopathy, bilateral: Secondary | ICD-10-CM | POA: Diagnosis not present

## 2016-06-04 DIAGNOSIS — H43813 Vitreous degeneration, bilateral: Secondary | ICD-10-CM

## 2016-06-05 DIAGNOSIS — Z23 Encounter for immunization: Secondary | ICD-10-CM | POA: Diagnosis not present

## 2016-06-05 DIAGNOSIS — I10 Essential (primary) hypertension: Secondary | ICD-10-CM | POA: Diagnosis not present

## 2016-06-05 DIAGNOSIS — M150 Primary generalized (osteo)arthritis: Secondary | ICD-10-CM | POA: Diagnosis not present

## 2016-06-05 DIAGNOSIS — M069 Rheumatoid arthritis, unspecified: Secondary | ICD-10-CM | POA: Diagnosis not present

## 2016-06-05 DIAGNOSIS — I4891 Unspecified atrial fibrillation: Secondary | ICD-10-CM | POA: Diagnosis not present

## 2016-06-14 DIAGNOSIS — Z7901 Long term (current) use of anticoagulants: Secondary | ICD-10-CM | POA: Diagnosis not present

## 2016-06-14 DIAGNOSIS — I4891 Unspecified atrial fibrillation: Secondary | ICD-10-CM | POA: Diagnosis not present

## 2016-07-11 ENCOUNTER — Other Ambulatory Visit: Payer: Self-pay

## 2016-07-19 DIAGNOSIS — I4891 Unspecified atrial fibrillation: Secondary | ICD-10-CM | POA: Diagnosis not present

## 2016-07-19 DIAGNOSIS — Z7901 Long term (current) use of anticoagulants: Secondary | ICD-10-CM | POA: Diagnosis not present

## 2016-07-23 ENCOUNTER — Encounter (INDEPENDENT_AMBULATORY_CARE_PROVIDER_SITE_OTHER): Payer: Medicare Other | Admitting: Ophthalmology

## 2016-07-23 ENCOUNTER — Ambulatory Visit (HOSPITAL_COMMUNITY)
Admission: EM | Admit: 2016-07-23 | Discharge: 2016-07-23 | Disposition: A | Discharge status: Home / Self Care | Payer: Medicare Other | Attending: Family Medicine | Admitting: Family Medicine

## 2016-07-23 ENCOUNTER — Ambulatory Visit (INDEPENDENT_AMBULATORY_CARE_PROVIDER_SITE_OTHER): Payer: Medicare Other

## 2016-07-23 ENCOUNTER — Encounter (HOSPITAL_COMMUNITY): Payer: Self-pay | Admitting: Family Medicine

## 2016-07-23 DIAGNOSIS — Y92099 Unspecified place in other non-institutional residence as the place of occurrence of the external cause: Secondary | ICD-10-CM

## 2016-07-23 DIAGNOSIS — H353122 Nonexudative age-related macular degeneration, left eye, intermediate dry stage: Secondary | ICD-10-CM | POA: Diagnosis not present

## 2016-07-23 DIAGNOSIS — H353211 Exudative age-related macular degeneration, right eye, with active choroidal neovascularization: Secondary | ICD-10-CM | POA: Diagnosis not present

## 2016-07-23 DIAGNOSIS — S60511A Abrasion of right hand, initial encounter: Secondary | ICD-10-CM | POA: Diagnosis not present

## 2016-07-23 DIAGNOSIS — S62609A Fracture of unspecified phalanx of unspecified finger, initial encounter for closed fracture: Secondary | ICD-10-CM | POA: Diagnosis not present

## 2016-07-23 DIAGNOSIS — W19XXXA Unspecified fall, initial encounter: Secondary | ICD-10-CM | POA: Diagnosis not present

## 2016-07-23 DIAGNOSIS — S62617A Displaced fracture of proximal phalanx of left little finger, initial encounter for closed fracture: Secondary | ICD-10-CM | POA: Diagnosis not present

## 2016-07-23 DIAGNOSIS — H43813 Vitreous degeneration, bilateral: Secondary | ICD-10-CM

## 2016-07-23 DIAGNOSIS — I10 Essential (primary) hypertension: Secondary | ICD-10-CM | POA: Diagnosis not present

## 2016-07-23 DIAGNOSIS — Y92009 Unspecified place in unspecified non-institutional (private) residence as the place of occurrence of the external cause: Principal | ICD-10-CM

## 2016-07-23 DIAGNOSIS — H35033 Hypertensive retinopathy, bilateral: Secondary | ICD-10-CM | POA: Diagnosis not present

## 2016-07-23 IMAGING — DX DG HAND COMPLETE 3+V*L*
3 series · 3 of 3 positions shown · non-contrast
Comparison: None.

CLINICAL DATA: Status post fall [DATE] with a left hand injury.
Pain. Initial encounter.

EXAM:
LEFT HAND - COMPLETE 3+ VIEW

[hand pa]
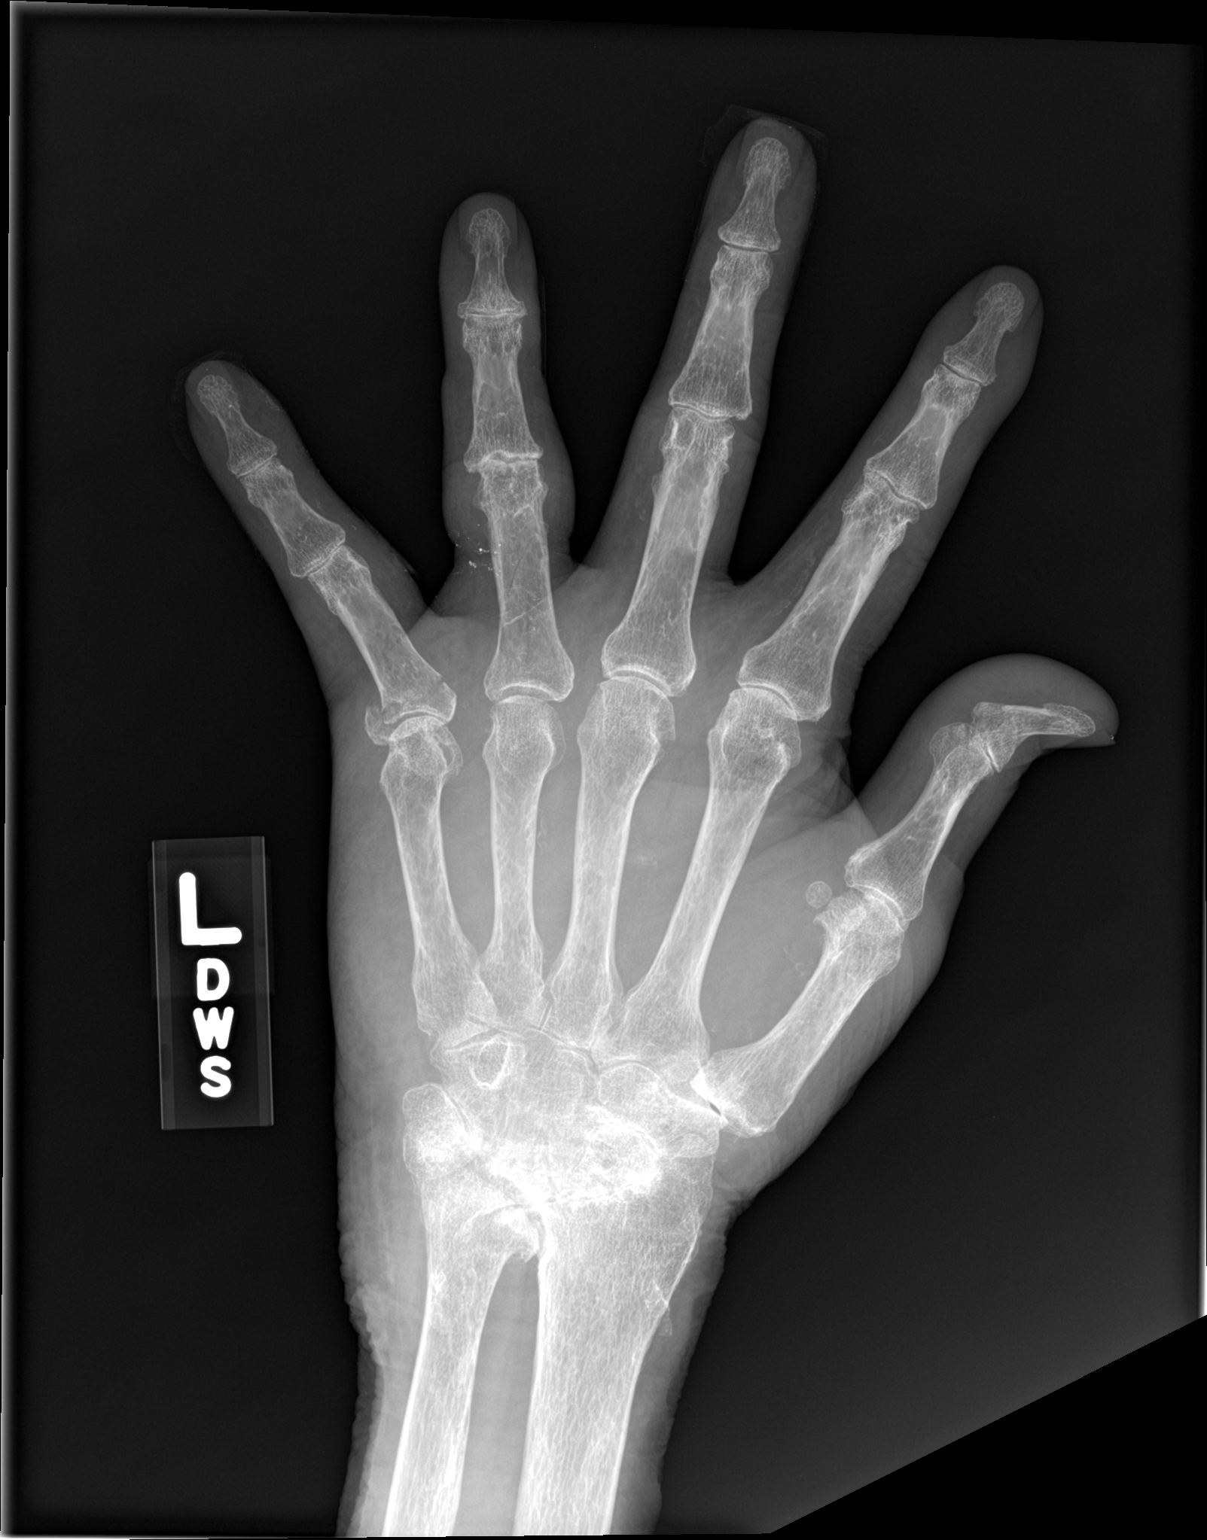

[hand obl]
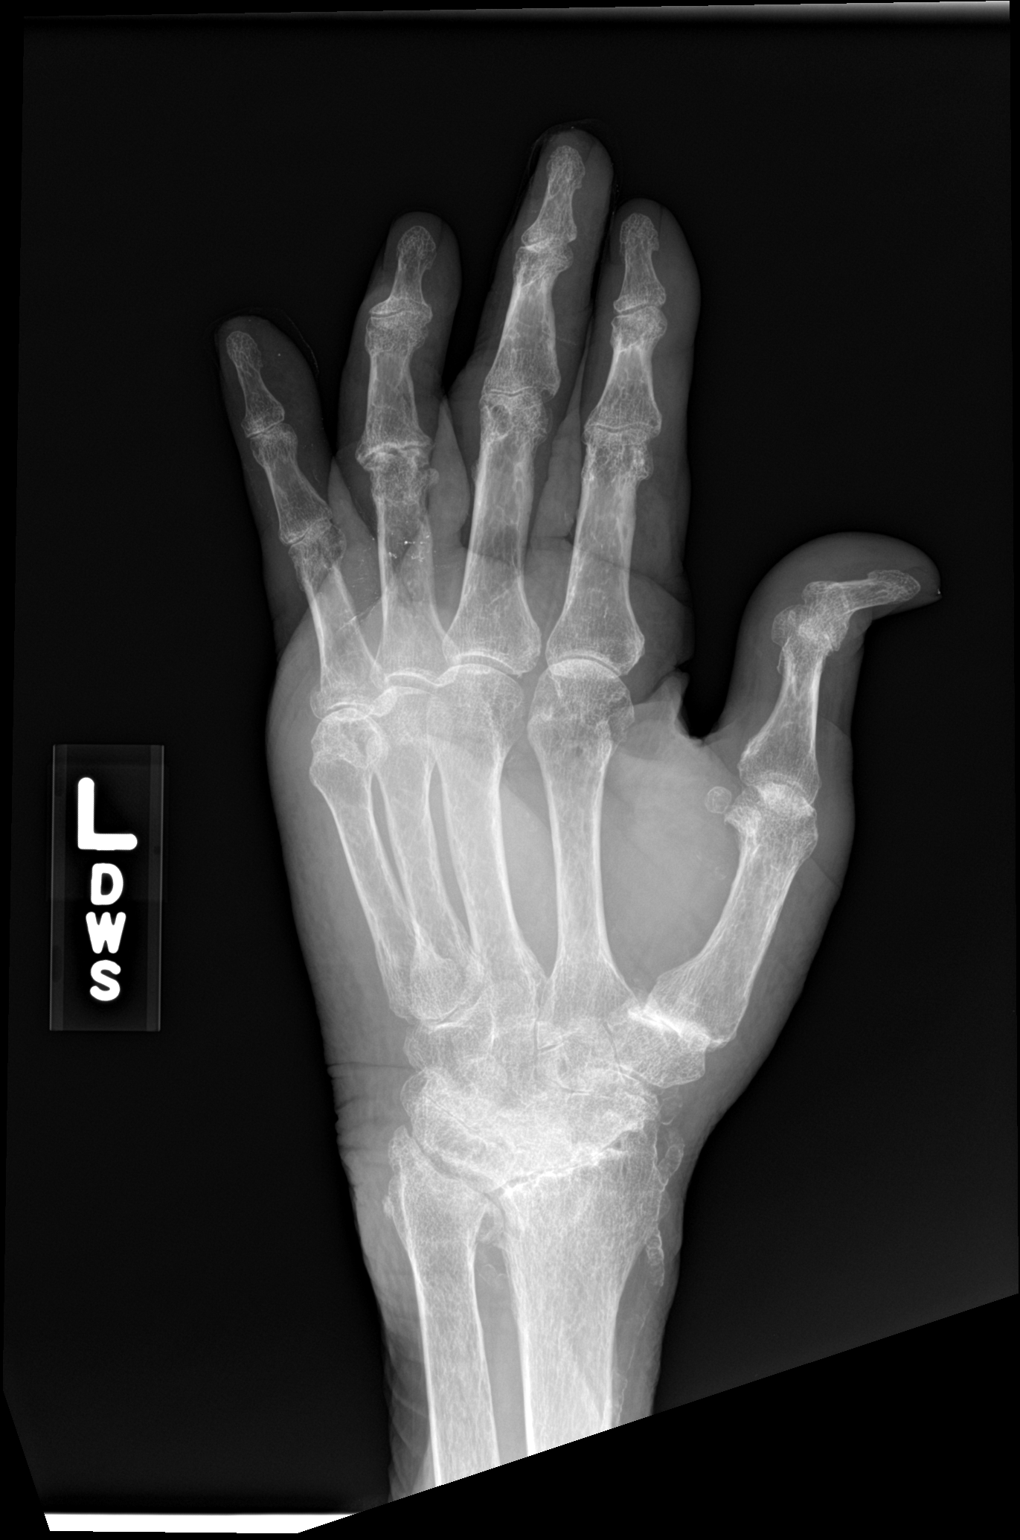

[hand lat]
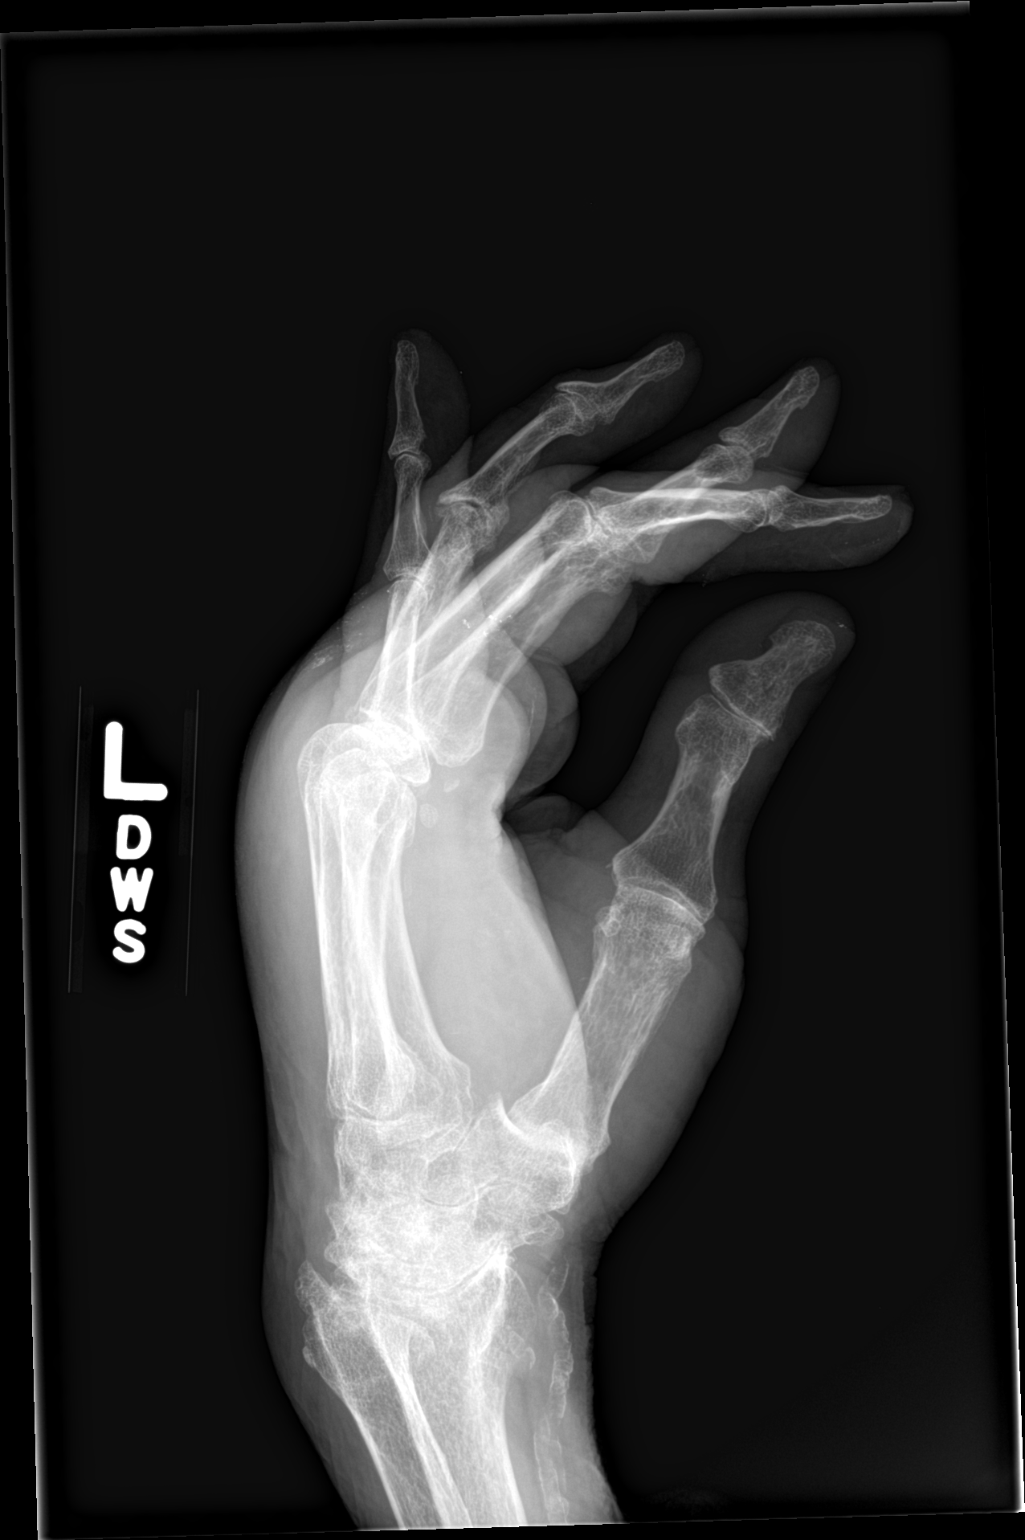

[3 of 3 positions shown; findings below may reference images not displayed]

FINDINGS: The patient has an acute fracture of the base of the proximal
phalanx of the little finger. The fracture is transverse through the
proximal metaphysis and extends the articular surface on the ulnar
side. Soft tissue swelling is present about the hand.

There is MCP joint space narrowing and marked carpal crowding
consistent with history of rheumatoid arthritis. Erosive change at
the distal radioulnar joint is identified and also seen at the third
MCP joint. Joint space narrowing and osteophytosis about the IP
joint of the thumb, DIP joints and PIP joints appears worst at the
PIP a and DIP joints of the ring finger. Bones are osteopenic.
IMPRESSION: Acute fracture base of the proximal phalanx of the left little
finger extends to the MCP joint.

Soft tissue swelling about the left hand.

Findings consistent with rheumatoid arthritis and osteoarthritis as
described above.

## 2016-07-23 IMAGING — DX DG HAND COMPLETE 3+V*R*
4 series · 4 of 4 positions shown · non-contrast
Comparison: None.

CLINICAL DATA: Fell yesterday in his job white around noon and
caught himself with both hands. Multiple finger pain and abrasions.

EXAM:
RIGHT HAND - COMPLETE 3+ VIEW

[hand pa]
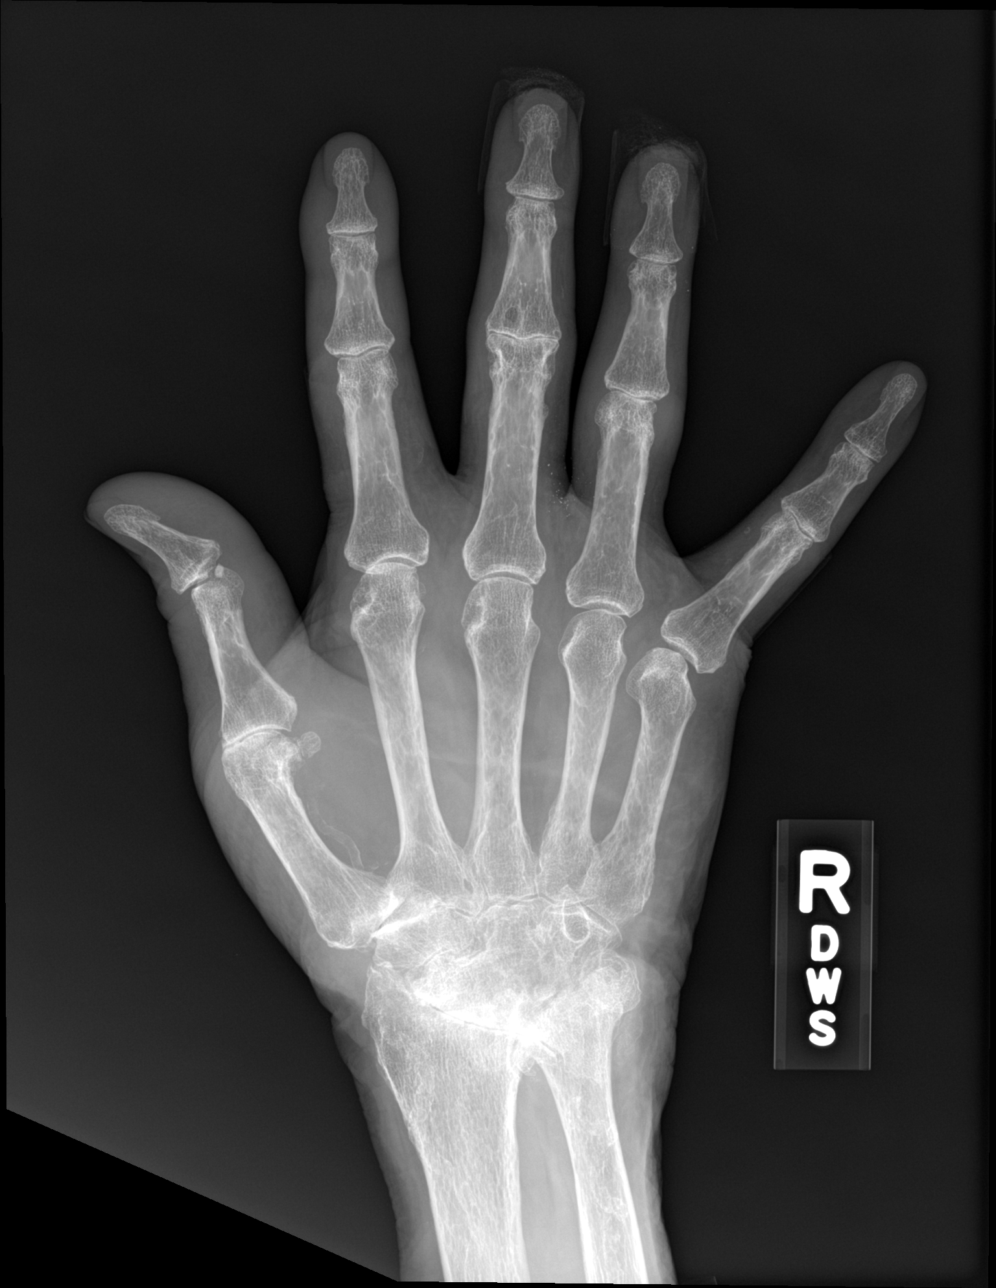

[hand obl]
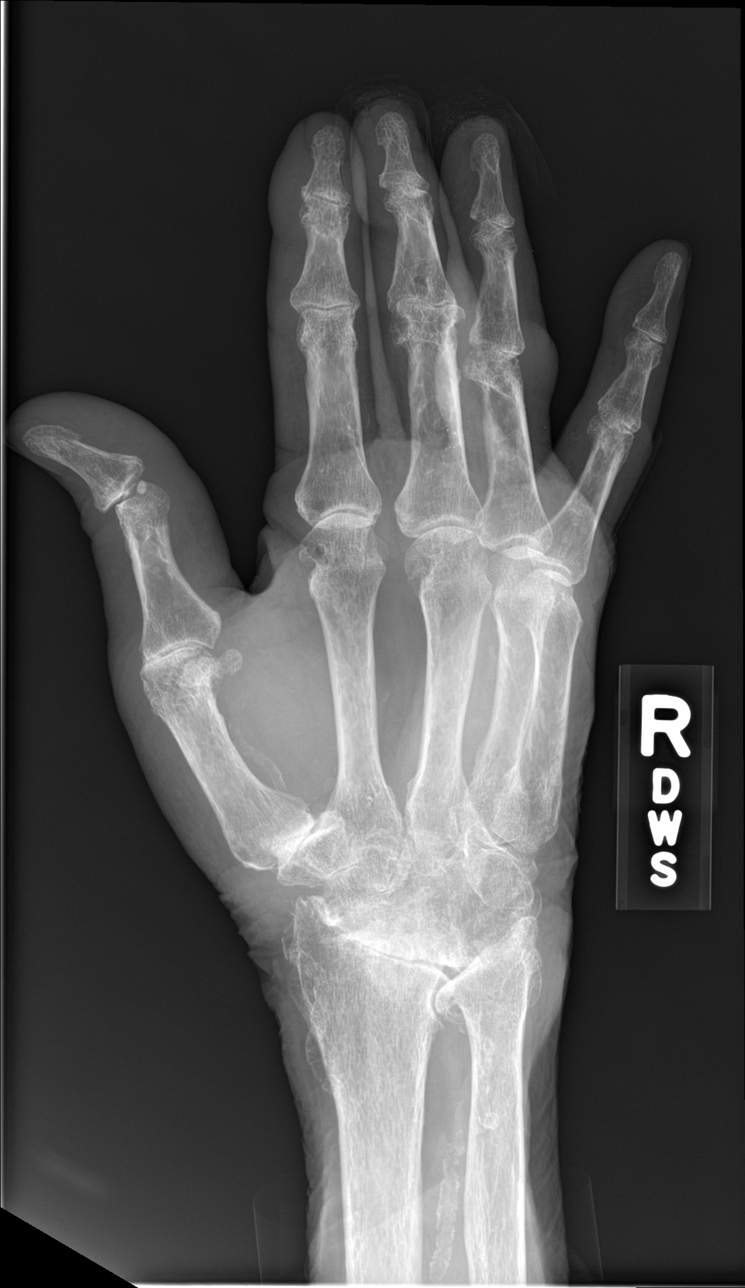

[hand lat (1 of 2)]
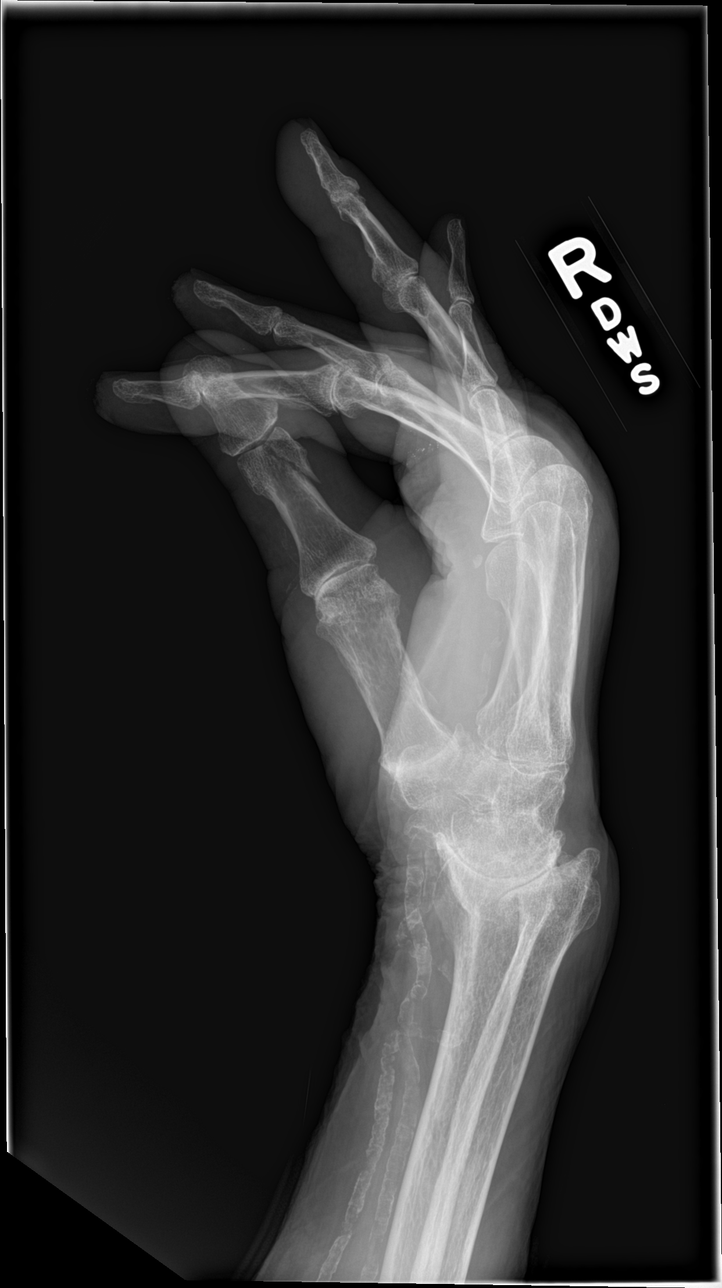

[hand lat (2 of 2)]
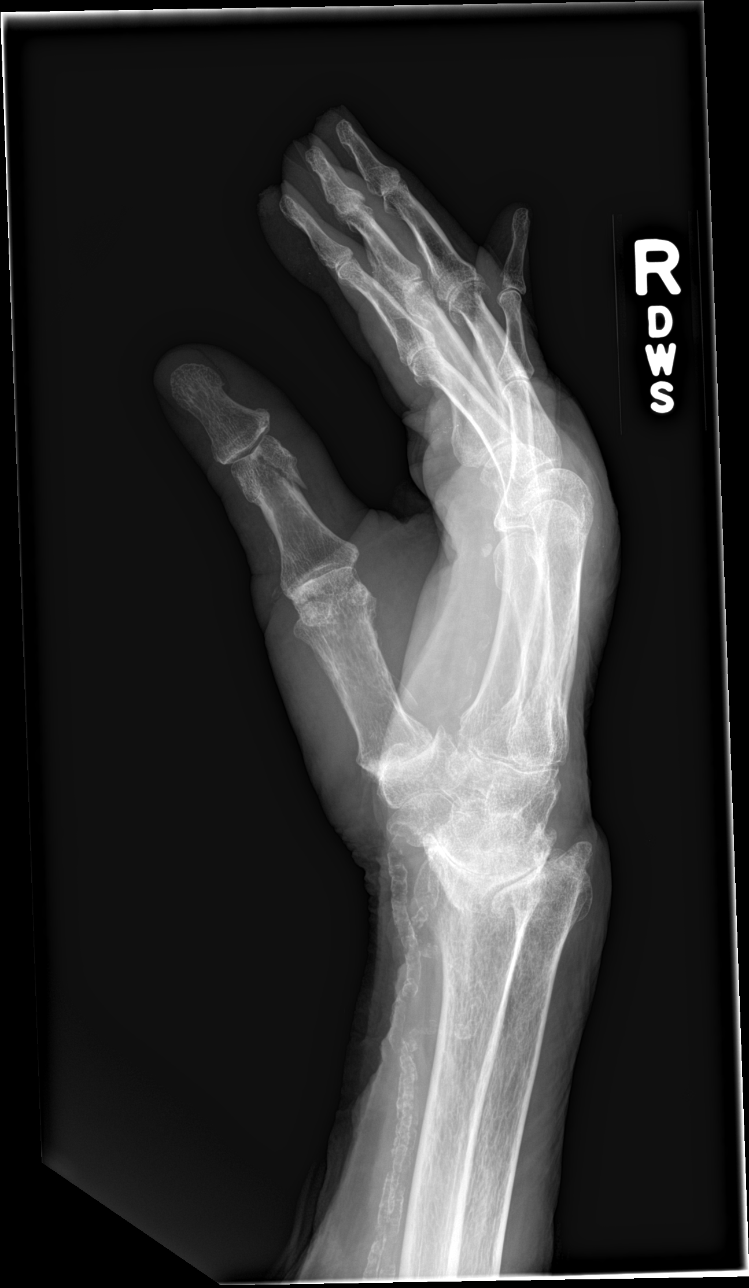

[4 of 4 positions shown; findings below may reference images not displayed]

FINDINGS: There is medial angulation of the little finger at the MCP joint.
Multiple tiny foreign bodies are demonstrated in or on the soft
tissues at the bases of the middle and ring fingers and the ring
finger more distally. Mild dorsal soft tissue swelling. No fracture
or dislocation seen. Severe radiocarpal and distal radioulnar joint
degenerative changes. Fusion of multiple carpal bones. Atheromatous
arterial calcifications
IMPRESSION: 1. No fracture or dislocation.
2. Multiple tiny foreign bodies in or on the soft tissues at the
bases of the middle and ring fingers and the ring finger more
distally.
3. Severe radiocarpal and distal radioulnar joint degenerative
changes and fusion of multiple carpal bones.
4. Extensive atheromatous arterial calcifications.

## 2016-07-23 NOTE — ED Provider Notes (Signed)
[place]    CSN: [id number] Arrival date & time: 07/23/16  1447  First Provider Contact:  None       History   Chief Complaint Chief Complaint  Patient presents with  . Fall    HPI [name] is a 82 y.o. male.   The history is provided by the patient and the spouse.  Fall  This is a new problem. The current episode started yesterday. The problem has not changed since onset.The symptoms are aggravated by bending.    Past Medical History:  Diagnosis Date  . A-fib ([place])   . Dysrhythmia    Atrial Tachycardia with block  . Erosive esophagitis   . GERD (gastroesophageal reflux disease)   . H/O fracture of rib    Left  5th, 6th, 7th had a fall in APRIL 2016  . RA (rheumatoid arthritis) [facility])     Patient Active Problem List   Diagnosis Date Noted  . Dysphagia 05/04/2015  . Stricture and stenosis of esophagus 05/04/2015  . GI bleed 03/12/2015  . Anemia 03/12/2015  . Left rib fracture- 5th, 6th, 7th 03/12/2015  . Hematemesis 03/12/2015  . Hyponatremia 03/11/2015  . UTI (lower urinary tract infection) 03/11/2015  . A-fib ([place]) 03/11/2015  . Weakness 03/11/2015  . Scalp hematoma 03/11/2015  . Warfarin-induced coagulopathy ([place]) 03/11/2015    Past Surgical History:  Procedure Laterality Date  . BALLOON DILATION N/A 05/04/2015   Procedure: BALLOON DILATION;  Surgeon: Wilford Corner, MD;  Location: [facility] ENDOSCOPY;  Service: Endoscopy;  Laterality: N/A;  janie/melissa  . CATARACT EXTRACTION    . ESOPHAGOGASTRODUODENOSCOPY N/A 03/12/2015   Procedure: ESOPHAGOGASTRODUODENOSCOPY (EGD);  Surgeon: Wilford Corner, MD;  Location: [facility] ENDOSCOPY;  Service: Endoscopy;  Laterality: N/A;  . ESOPHAGOGASTRODUODENOSCOPY N/A 05/04/2015   Procedure: ESOPHAGOGASTRODUODENOSCOPY (EGD);  Surgeon: Wilford Corner, MD;  Location: [facility] ENDOSCOPY;  Service: Endoscopy;  Laterality: N/A;  carm  . EYE SURGERY Bilateral 10/2015  . SAVORY DILATION N/A 05/04/2015   Procedure: SAVORY  DILATION;  Surgeon: Wilford Corner, MD;  Location: [facility] ENDOSCOPY;  Service: Endoscopy;  Laterality: N/A;       Home Medications    Prior to Admission medications   Medication Sig Start Date End Date Taking? Authorizing Provider  bacitracin-polymyxin b (POLYSPORIN) ophthalmic ointment Place 1 application into the left eye 2 (two) times daily. Apply to left eye lid 04/28/15   Historical Provider, MD  Besifloxacin HCl (BESIVANCE) 0.6 % SUSP Place 3-4 drops into the right eye See admin instructions. Instill 3 drops into the right eye on the same day as eye injection (every 4-5 weeks - last injection 04/27/15); instill 4 drops into the right eye on the day after the eye injection    Historical Provider, MD  diltiazem (TIAZAC) 360 MG 24 hr capsule Take 360 mg by mouth daily. 02/20/15   Historical Provider, MD  metoprolol succinate (TOPROL-XL) 50 MG 24 hr tablet Take 50 mg by mouth daily.  04/08/15   Historical Provider, MD  Multiple Vitamins-Minerals (PRESERVISION AREDS 2) CAPS Take 1 capsule by mouth 2 (two) times daily.    Historical Provider, MD  Omega-3 Fatty Acids (FISH OIL) 1000 MG CAPS Take 1,000 capsules by mouth 2 (two) times daily.     Historical Provider, MD  pantoprazole (PROTONIX) 40 MG tablet Take 1 tablet (40 mg total) by mouth 2 (two) times daily before a meal. Patient taking differently: Take 40 mg by mouth daily.  03/16/15   Maryann Mikhail, DO  warfarin (COUMADIN)  2 MG tablet Take 2 mg by mouth daily.  02/08/15   Historical Provider, MD    Family History History reviewed. No pertinent family history.  Social History Social History  Substance Use Topics  . Smoking status: Light Tobacco Smoker    Types: Cigars  . Smokeless tobacco: Never Used  . Alcohol use No     Comment: April 21, 2005 last drink     Allergies   Shellfish allergy and Sulfa antibiotics   Review of Systems Review of Systems  Constitutional: Negative.   Musculoskeletal: Positive for joint swelling.    Skin: Positive for wound.  All other systems reviewed and are negative.    Physical Exam Triage Vital Signs ED Triage Vitals  Enc Vitals Group     BP 07/23/16 1602 151/63     Pulse Rate 07/23/16 1602 78     Resp 07/23/16 1602 14     Temp 07/23/16 1602 98.4 F (36.9 C)     Temp Source 07/23/16 1602 Oral     SpO2 07/23/16 1602 97 %     Weight --      Height --      Head Circumference --      Peak Flow --      Pain Score 07/23/16 1616 5     Pain Loc --      Pain Edu? --      Excl. in [place]? --    No data found.   Updated Vital Signs BP 151/63 (BP Location: Left Arm)   Pulse 78   Temp 98.4 F (36.9 C) (Oral)   Resp 14   SpO2 97%   Visual Acuity Right Eye Distance:   Left Eye Distance:   Bilateral Distance:    Right Eye Near:   Left Eye Near:    Bilateral Near:     Physical Exam  Constitutional: He is oriented to person, place, and time. He appears well-developed and well-nourished. No distress.  Musculoskeletal: He exhibits tenderness.  bilat hand pain swelling and w ith ecchymosis, wrists intact,ring fingers required rings to be cut off, nvt intact.  Neurological: He is alert and oriented to person, place, and time.  Skin: Skin is warm and dry.  Nursing note and vitals reviewed.    UC Treatments / Results  Labs (all labs ordered are listed, but only abnormal results are displayed) Labs Reviewed - No data to display  EKG  EKG Interpretation None       Radiology No results found.  Procedures Procedures (including critical care time)  Medications Ordered in UC Medications - No data to display   Initial Impression / Assessment and Plan / UC Course  I have reviewed the triage vital signs and the nursing notes.  Pertinent labs & imaging results that were available during my care of the patient were reviewed by me and considered in my medical decision making (see chart for details).  Clinical Course      Final Clinical Impressions(s) / UC  Diagnoses   Final diagnoses:  None    New Prescriptions New Prescriptions   No medications on file     Billy Fischer, MD 07/23/16 1807

## 2016-07-23 NOTE — ED Notes (Signed)
The patient's 4th and 5th fingers were buddy taped together using coban tape per providers order.

## 2016-07-23 NOTE — Discharge Instructions (Signed)
Call in am to see orthopedist when possible., ice for swelling.

## 2016-07-23 NOTE — ED Triage Notes (Signed)
Pt here for bilateral hand injuries after a fall yesterday. sts that he fell on the concrete. Denies hitting head. Pt on blood thinners and left hand very swollen.

## 2016-07-25 DIAGNOSIS — S62609A Fracture of unspecified phalanx of unspecified finger, initial encounter for closed fracture: Secondary | ICD-10-CM | POA: Diagnosis not present

## 2016-07-25 DIAGNOSIS — Z23 Encounter for immunization: Secondary | ICD-10-CM | POA: Diagnosis not present

## 2016-07-25 DIAGNOSIS — S62607A Fracture of unspecified phalanx of left little finger, initial encounter for closed fracture: Secondary | ICD-10-CM | POA: Diagnosis not present

## 2016-08-09 DIAGNOSIS — Z7901 Long term (current) use of anticoagulants: Secondary | ICD-10-CM | POA: Diagnosis not present

## 2016-08-09 DIAGNOSIS — I4892 Unspecified atrial flutter: Secondary | ICD-10-CM | POA: Diagnosis not present

## 2016-08-09 DIAGNOSIS — I10 Essential (primary) hypertension: Secondary | ICD-10-CM | POA: Diagnosis not present

## 2016-08-09 DIAGNOSIS — E785 Hyperlipidemia, unspecified: Secondary | ICD-10-CM | POA: Diagnosis not present

## 2016-08-09 DIAGNOSIS — I482 Chronic atrial fibrillation: Secondary | ICD-10-CM | POA: Diagnosis not present

## 2016-08-16 DIAGNOSIS — L812 Freckles: Secondary | ICD-10-CM | POA: Diagnosis not present

## 2016-08-16 DIAGNOSIS — Z23 Encounter for immunization: Secondary | ICD-10-CM | POA: Diagnosis not present

## 2016-08-16 DIAGNOSIS — Z7901 Long term (current) use of anticoagulants: Secondary | ICD-10-CM | POA: Diagnosis not present

## 2016-08-16 DIAGNOSIS — D3617 Benign neoplasm of peripheral nerves and autonomic nervous system of trunk, unspecified: Secondary | ICD-10-CM | POA: Diagnosis not present

## 2016-08-16 DIAGNOSIS — Z85828 Personal history of other malignant neoplasm of skin: Secondary | ICD-10-CM | POA: Diagnosis not present

## 2016-08-16 DIAGNOSIS — L821 Other seborrheic keratosis: Secondary | ICD-10-CM | POA: Diagnosis not present

## 2016-08-16 DIAGNOSIS — L570 Actinic keratosis: Secondary | ICD-10-CM | POA: Diagnosis not present

## 2016-08-16 DIAGNOSIS — I4891 Unspecified atrial fibrillation: Secondary | ICD-10-CM | POA: Diagnosis not present

## 2016-08-26 ENCOUNTER — Encounter (HOSPITAL_COMMUNITY): Payer: Self-pay | Admitting: Emergency Medicine

## 2016-08-26 ENCOUNTER — Emergency Department (HOSPITAL_COMMUNITY)
Admission: EM | Admit: 2016-08-26 | Discharge: 2016-08-26 | Disposition: A | Discharge status: Home / Self Care | Payer: Medicare Other | Attending: Emergency Medicine | Admitting: Emergency Medicine

## 2016-08-26 DIAGNOSIS — I10 Essential (primary) hypertension: Secondary | ICD-10-CM | POA: Diagnosis not present

## 2016-08-26 DIAGNOSIS — R000 Tachycardia, unspecified: Secondary | ICD-10-CM | POA: Diagnosis not present

## 2016-08-26 DIAGNOSIS — R040 Epistaxis: Secondary | ICD-10-CM | POA: Insufficient documentation

## 2016-08-26 DIAGNOSIS — F17290 Nicotine dependence, other tobacco product, uncomplicated: Secondary | ICD-10-CM | POA: Diagnosis not present

## 2016-08-26 DIAGNOSIS — Z7901 Long term (current) use of anticoagulants: Secondary | ICD-10-CM | POA: Diagnosis not present

## 2016-08-26 HISTORY — DX: Essential (primary) hypertension: I10

## 2016-08-26 LAB — BASIC METABOLIC PANEL
ANION GAP: 6 (ref 5–15)
BUN: 17 mg/dL (ref 6–20)
CHLORIDE: 104 mmol/L (ref 101–111)
CO2: 25 mmol/L (ref 22–32)
Calcium: 9 mg/dL (ref 8.9–10.3)
Creatinine, Ser: 1.08 mg/dL (ref 0.61–1.24)
GFR calc non Af Amer: 58 mL/min — ABNORMAL LOW (ref >60)
GLUCOSE: 108 mg/dL — AB (ref 65–99)
POTASSIUM: 4 mmol/L (ref 3.5–5.1)
Sodium: 135 mmol/L (ref 135–145)

## 2016-08-26 LAB — CBC
HCT: 36.8 % — ABNORMAL LOW (ref 39.0–52.0)
Hemoglobin: 12.7 g/dL — ABNORMAL LOW (ref 13.0–17.0)
MCH: 32.7 pg (ref 26.0–34.0)
MCHC: 34.5 g/dL (ref 30.0–36.0)
MCV: 94.8 fL (ref 78.0–100.0)
PLATELETS: 209 10*3/uL (ref 150–400)
RBC: 3.88 MIL/uL — ABNORMAL LOW (ref 4.22–5.81)
RDW: 14.6 % (ref 11.5–15.5)
WBC: 6.1 10*3/uL (ref 4.0–10.5)

## 2016-08-26 LAB — PROTIME-INR
INR: 2.11
PROTHROMBIN TIME: 24 s — AB (ref 11.4–15.2)

## 2016-08-26 MED ORDER — OXYMETAZOLINE HCL 0.05 % NA SOLN
2.0000 | Freq: Once | NASAL | Status: AC
  Administered 2016-08-26: 2 via NASAL
  Filled 2016-08-26: qty 15

## 2016-08-26 MED ORDER — OXYMETAZOLINE HCL 0.05 % NA SOLN: 1.0000 | Freq: Once | NASAL | Status: DC

## 2016-08-26 NOTE — ED Triage Notes (Signed)
Pt arrives from home via [facility] reporting epistaxis this am.  EMS reports pt anxious, hypertensive.  Bleeding controlled at this time. Pt applying direct pressure,  AOx4, NAD noted at this time.

## 2016-08-26 NOTE — ED Provider Notes (Signed)
[place] DEPT Provider Note   CSN: [id number] Arrival date & time: 08/26/16  0716     History   Chief Complaint Chief Complaint  Patient presents with  . Epistaxis    HPI [name] is a 82 y.o. male.  HPI Patient with history of atrophic ablation on Coumadin presents with epistaxis starting this morning while having a bowel movement. States he woke up in the middle of night at a small amount of bleeding from his nose which resolved and went back to bed. Woke again around 6 AM with bleeding that started while having a bowel movement. Unsure which nare blood is coming from. Denies any pain or trauma. No recent medication changes. No lightheadedness or syncope. Past Medical History:  Diagnosis Date  . A-fib ([place])   . Dysrhythmia    Atrial Tachycardia with block  . Erosive esophagitis   . GERD (gastroesophageal reflux disease)   . H/O fracture of rib    Left  5th, 6th, 7th had a fall in APRIL 2016  . Hypertension   . RA (rheumatoid arthritis) [facility])     Patient Active Problem List   Diagnosis Date Noted  . Dysphagia 05/04/2015  . Stricture and stenosis of esophagus 05/04/2015  . GI bleed 03/12/2015  . Anemia 03/12/2015  . Left rib fracture- 5th, 6th, 7th 03/12/2015  . Hematemesis 03/12/2015  . Hyponatremia 03/11/2015  . UTI (lower urinary tract infection) 03/11/2015  . A-fib ([place]) 03/11/2015  . Weakness 03/11/2015  . Scalp hematoma 03/11/2015  . Warfarin-induced coagulopathy ([place]) 03/11/2015    Past Surgical History:  Procedure Laterality Date  . BALLOON DILATION N/A 05/04/2015   Procedure: BALLOON DILATION;  Surgeon: Wilford Corner, MD;  Location: [facility] ENDOSCOPY;  Service: Endoscopy;  Laterality: N/A;  janie/melissa  . CATARACT EXTRACTION    . ESOPHAGOGASTRODUODENOSCOPY N/A 03/12/2015   Procedure: ESOPHAGOGASTRODUODENOSCOPY (EGD);  Surgeon: Wilford Corner, MD;  Location: [facility] ENDOSCOPY;  Service: Endoscopy;  Laterality: N/A;  . ESOPHAGOGASTRODUODENOSCOPY  N/A 05/04/2015   Procedure: ESOPHAGOGASTRODUODENOSCOPY (EGD);  Surgeon: Wilford Corner, MD;  Location: [facility] ENDOSCOPY;  Service: Endoscopy;  Laterality: N/A;  carm  . EYE SURGERY Bilateral 10/2015  . SAVORY DILATION N/A 05/04/2015   Procedure: SAVORY DILATION;  Surgeon: Wilford Corner, MD;  Location: [facility] ENDOSCOPY;  Service: Endoscopy;  Laterality: N/A;       Home Medications    Prior to Admission medications   Medication Sig Start Date End Date Taking? Authorizing Provider  diltiazem (TIAZAC) 360 MG 24 hr capsule Take 360 mg by mouth daily. 02/20/15  Yes Historical Provider, MD  metoprolol succinate (TOPROL-XL) 50 MG 24 hr tablet Take 50 mg by mouth daily.  04/08/15  Yes Historical Provider, MD  Multiple Vitamins-Minerals (PRESERVISION AREDS 2) CAPS Take 1 capsule by mouth 2 (two) times daily.   Yes Historical Provider, MD  Omega-3 Fatty Acids (FISH OIL) 1000 MG CAPS Take 1,000 capsules by mouth 2 (two) times daily.    Yes Historical Provider, MD  warfarin (COUMADIN) 2 MG tablet Take 2-4 mg by mouth See admin instructions. Take 4 mg by mouth on Wednesday and take 2 mg by mouth on all other days. 02/08/15  Yes Historical Provider, MD  pantoprazole (PROTONIX) 40 MG tablet Take 1 tablet (40 mg total) by mouth 2 (two) times daily before a meal. Patient not taking: Reported on 08/26/2016 03/16/15   Cristal Ford, DO    Family History No family history on file.  Social History Social History  Substance Use Topics  .  Smoking status: Light Tobacco Smoker    Types: Cigars  . Smokeless tobacco: Never Used  . Alcohol use No     Comment: April 21, 2005 last drink     Allergies   Shellfish allergy and Sulfa antibiotics   Review of Systems Review of Systems  Constitutional: Negative for fatigue.  HENT: Positive for nosebleeds.   Respiratory: Negative for shortness of breath.   Cardiovascular: Negative for chest pain.  Gastrointestinal: Negative for abdominal pain, nausea and vomiting.    Skin: Negative for rash and wound.  Neurological: Negative for dizziness, syncope, weakness, light-headedness and numbness.  All other systems reviewed and are negative.    Physical Exam Updated Vital Signs BP (!) 161/104   Pulse 83   Temp 97.6 F (36.4 C) (Oral)   Resp 20   Ht 5\' 10"  (1.778 m)   Wt 185 lb (83.9 kg)   SpO2 100%   BMI 26.54 kg/m   Physical Exam  Constitutional: He is oriented to person, place, and time. He appears well-developed and well-nourished. No distress.  HENT:  Head: Normocephalic and atraumatic.  Mouth/Throat: Oropharynx is clear and moist.  Dried blood in the right nare. No visualized active bleeding. Patient has dark blood clot in the oropharynx without obvious active bleeding.  Eyes: EOM are normal. Pupils are equal, round, and reactive to light.  Neck: Normal range of motion. Neck supple.  Cardiovascular: Normal rate and regular rhythm.   Pulmonary/Chest: Effort normal and breath sounds normal.  Abdominal: Soft. Bowel sounds are normal. There is no tenderness. There is no rebound and no guarding.  Musculoskeletal: Normal range of motion. He exhibits no edema or tenderness.  Neurological: He is alert and oriented to person, place, and time.  Moves all extremities without deficit. Sensation intact.  Skin: Skin is warm and dry. Capillary refill takes less than 2 seconds. No rash noted. No erythema.  Psychiatric:  Patient appears anxious  Nursing note and vitals reviewed.    ED Treatments / Results  Labs (all labs ordered are listed, but only abnormal results are displayed) Labs Reviewed  CBC - Abnormal; Notable for the following:       Result Value   RBC 3.88 (*)    Hemoglobin 12.7 (*)    HCT 36.8 (*)    All other components within normal limits  PROTIME-INR - Abnormal; Notable for the following:    Prothrombin Time 24.0 (*)    All other components within normal limits  BASIC METABOLIC PANEL - Abnormal; Notable for the following:     Glucose, Bld 108 (*)    GFR calc non Af Amer 58 (*)    All other components within normal limits    EKG  EKG Interpretation None       Radiology No results found.  Procedures Procedures (including critical care time)  Medications Ordered in ED Medications  oxymetazoline (AFRIN) 0.05 % nasal spray 2 spray (2 sprays Right Nare Given 08/26/16 0837)     Initial Impression / Assessment and Plan / ED Course  I have reviewed the triage vital signs and the nursing notes.  Pertinent labs & imaging results that were available during my care of the patient were reviewed by me and considered in my medical decision making (see chart for details).  Clinical Course   Epistaxis appears to be resolved at this time. Will check INR and hemoglobin level. Blood pressures improved. Will cont to monitor  Final Clinical Impressions(s) / ED Diagnoses  Final diagnoses:  Epistaxis  Right-sided epistaxis   Observed several hours in the emergency department with no recurrence of the epistaxis. Patient and family educated on treatment of that epistaxis in the future and given return precautions. New Prescriptions New Prescriptions   No medications on file     Julianne Rice, MD 08/26/16 1225

## 2016-08-30 DIAGNOSIS — M0589 Other rheumatoid arthritis with rheumatoid factor of multiple sites: Secondary | ICD-10-CM | POA: Diagnosis not present

## 2016-09-10 ENCOUNTER — Encounter (INDEPENDENT_AMBULATORY_CARE_PROVIDER_SITE_OTHER): Payer: Medicare Other | Admitting: Ophthalmology

## 2016-09-10 DIAGNOSIS — H43813 Vitreous degeneration, bilateral: Secondary | ICD-10-CM

## 2016-09-10 DIAGNOSIS — I10 Essential (primary) hypertension: Secondary | ICD-10-CM | POA: Diagnosis not present

## 2016-09-10 DIAGNOSIS — H353211 Exudative age-related macular degeneration, right eye, with active choroidal neovascularization: Secondary | ICD-10-CM | POA: Diagnosis not present

## 2016-09-10 DIAGNOSIS — H353122 Nonexudative age-related macular degeneration, left eye, intermediate dry stage: Secondary | ICD-10-CM | POA: Diagnosis not present

## 2016-09-10 DIAGNOSIS — H35033 Hypertensive retinopathy, bilateral: Secondary | ICD-10-CM

## 2016-09-13 DIAGNOSIS — I4891 Unspecified atrial fibrillation: Secondary | ICD-10-CM | POA: Diagnosis not present

## 2016-09-13 DIAGNOSIS — Z7901 Long term (current) use of anticoagulants: Secondary | ICD-10-CM | POA: Diagnosis not present

## 2016-10-18 DIAGNOSIS — Z7901 Long term (current) use of anticoagulants: Secondary | ICD-10-CM | POA: Diagnosis not present

## 2016-10-18 DIAGNOSIS — I4891 Unspecified atrial fibrillation: Secondary | ICD-10-CM | POA: Diagnosis not present

## 2016-10-29 ENCOUNTER — Encounter (INDEPENDENT_AMBULATORY_CARE_PROVIDER_SITE_OTHER): Payer: Medicare Other | Admitting: Ophthalmology

## 2016-10-29 DIAGNOSIS — H353211 Exudative age-related macular degeneration, right eye, with active choroidal neovascularization: Secondary | ICD-10-CM

## 2016-10-29 DIAGNOSIS — H35033 Hypertensive retinopathy, bilateral: Secondary | ICD-10-CM | POA: Diagnosis not present

## 2016-10-29 DIAGNOSIS — I10 Essential (primary) hypertension: Secondary | ICD-10-CM

## 2016-10-29 DIAGNOSIS — H35371 Puckering of macula, right eye: Secondary | ICD-10-CM

## 2016-10-29 DIAGNOSIS — H353122 Nonexudative age-related macular degeneration, left eye, intermediate dry stage: Secondary | ICD-10-CM

## 2016-10-29 DIAGNOSIS — H43813 Vitreous degeneration, bilateral: Secondary | ICD-10-CM

## 2016-11-15 DIAGNOSIS — Z7901 Long term (current) use of anticoagulants: Secondary | ICD-10-CM | POA: Diagnosis not present

## 2016-11-15 DIAGNOSIS — I4891 Unspecified atrial fibrillation: Secondary | ICD-10-CM | POA: Diagnosis not present

## 2016-12-13 DIAGNOSIS — Z7901 Long term (current) use of anticoagulants: Secondary | ICD-10-CM | POA: Diagnosis not present

## 2016-12-13 DIAGNOSIS — I4891 Unspecified atrial fibrillation: Secondary | ICD-10-CM | POA: Diagnosis not present

## 2016-12-24 ENCOUNTER — Encounter (INDEPENDENT_AMBULATORY_CARE_PROVIDER_SITE_OTHER): Payer: Medicare Other | Admitting: Ophthalmology

## 2016-12-26 ENCOUNTER — Encounter (INDEPENDENT_AMBULATORY_CARE_PROVIDER_SITE_OTHER): Payer: Medicare Other | Admitting: Ophthalmology

## 2016-12-28 ENCOUNTER — Encounter (INDEPENDENT_AMBULATORY_CARE_PROVIDER_SITE_OTHER): Payer: Medicare Other | Admitting: Ophthalmology

## 2016-12-28 DIAGNOSIS — H353122 Nonexudative age-related macular degeneration, left eye, intermediate dry stage: Secondary | ICD-10-CM

## 2016-12-28 DIAGNOSIS — H35033 Hypertensive retinopathy, bilateral: Secondary | ICD-10-CM | POA: Diagnosis not present

## 2016-12-28 DIAGNOSIS — I10 Essential (primary) hypertension: Secondary | ICD-10-CM | POA: Diagnosis not present

## 2016-12-28 DIAGNOSIS — H353211 Exudative age-related macular degeneration, right eye, with active choroidal neovascularization: Secondary | ICD-10-CM | POA: Diagnosis not present

## 2016-12-28 DIAGNOSIS — H43813 Vitreous degeneration, bilateral: Secondary | ICD-10-CM

## 2017-01-10 DIAGNOSIS — Z7901 Long term (current) use of anticoagulants: Secondary | ICD-10-CM | POA: Diagnosis not present

## 2017-01-10 DIAGNOSIS — I4891 Unspecified atrial fibrillation: Secondary | ICD-10-CM | POA: Diagnosis not present

## 2017-02-11 DIAGNOSIS — I4891 Unspecified atrial fibrillation: Secondary | ICD-10-CM | POA: Diagnosis not present

## 2017-02-11 DIAGNOSIS — Z7901 Long term (current) use of anticoagulants: Secondary | ICD-10-CM | POA: Diagnosis not present

## 2017-02-21 DIAGNOSIS — L820 Inflamed seborrheic keratosis: Secondary | ICD-10-CM | POA: Diagnosis not present

## 2017-02-21 DIAGNOSIS — Z85828 Personal history of other malignant neoplasm of skin: Secondary | ICD-10-CM | POA: Diagnosis not present

## 2017-02-21 DIAGNOSIS — L821 Other seborrheic keratosis: Secondary | ICD-10-CM | POA: Diagnosis not present

## 2017-02-21 DIAGNOSIS — L812 Freckles: Secondary | ICD-10-CM | POA: Diagnosis not present

## 2017-02-21 DIAGNOSIS — L570 Actinic keratosis: Secondary | ICD-10-CM | POA: Diagnosis not present

## 2017-02-21 DIAGNOSIS — D3617 Benign neoplasm of peripheral nerves and autonomic nervous system of trunk, unspecified: Secondary | ICD-10-CM | POA: Diagnosis not present

## 2017-02-28 DIAGNOSIS — Z6826 Body mass index (BMI) 26.0-26.9, adult: Secondary | ICD-10-CM | POA: Diagnosis not present

## 2017-02-28 DIAGNOSIS — E663 Overweight: Secondary | ICD-10-CM | POA: Diagnosis not present

## 2017-02-28 DIAGNOSIS — M0589 Other rheumatoid arthritis with rheumatoid factor of multiple sites: Secondary | ICD-10-CM | POA: Diagnosis not present

## 2017-03-08 ENCOUNTER — Encounter (INDEPENDENT_AMBULATORY_CARE_PROVIDER_SITE_OTHER): Payer: Medicare Other | Admitting: Ophthalmology

## 2017-03-08 DIAGNOSIS — H353211 Exudative age-related macular degeneration, right eye, with active choroidal neovascularization: Secondary | ICD-10-CM | POA: Diagnosis not present

## 2017-03-08 DIAGNOSIS — H35033 Hypertensive retinopathy, bilateral: Secondary | ICD-10-CM

## 2017-03-08 DIAGNOSIS — I10 Essential (primary) hypertension: Secondary | ICD-10-CM

## 2017-03-08 DIAGNOSIS — H353122 Nonexudative age-related macular degeneration, left eye, intermediate dry stage: Secondary | ICD-10-CM | POA: Diagnosis not present

## 2017-03-08 DIAGNOSIS — H43813 Vitreous degeneration, bilateral: Secondary | ICD-10-CM | POA: Diagnosis not present

## 2017-03-14 DIAGNOSIS — Z7901 Long term (current) use of anticoagulants: Secondary | ICD-10-CM | POA: Diagnosis not present

## 2017-03-14 DIAGNOSIS — I4891 Unspecified atrial fibrillation: Secondary | ICD-10-CM | POA: Diagnosis not present

## 2017-04-15 DIAGNOSIS — Z7901 Long term (current) use of anticoagulants: Secondary | ICD-10-CM | POA: Diagnosis not present

## 2017-04-15 DIAGNOSIS — I4891 Unspecified atrial fibrillation: Secondary | ICD-10-CM | POA: Diagnosis not present

## 2017-05-13 DIAGNOSIS — Z7901 Long term (current) use of anticoagulants: Secondary | ICD-10-CM | POA: Diagnosis not present

## 2017-05-13 DIAGNOSIS — I4891 Unspecified atrial fibrillation: Secondary | ICD-10-CM | POA: Diagnosis not present

## 2017-05-17 ENCOUNTER — Encounter (INDEPENDENT_AMBULATORY_CARE_PROVIDER_SITE_OTHER): Payer: Medicare Other | Admitting: Ophthalmology

## 2017-05-17 DIAGNOSIS — H353211 Exudative age-related macular degeneration, right eye, with active choroidal neovascularization: Secondary | ICD-10-CM | POA: Diagnosis not present

## 2017-05-17 DIAGNOSIS — H43813 Vitreous degeneration, bilateral: Secondary | ICD-10-CM

## 2017-05-17 DIAGNOSIS — I10 Essential (primary) hypertension: Secondary | ICD-10-CM | POA: Diagnosis not present

## 2017-05-17 DIAGNOSIS — H353122 Nonexudative age-related macular degeneration, left eye, intermediate dry stage: Secondary | ICD-10-CM | POA: Diagnosis not present

## 2017-05-17 DIAGNOSIS — H35033 Hypertensive retinopathy, bilateral: Secondary | ICD-10-CM

## 2017-06-11 DIAGNOSIS — I4891 Unspecified atrial fibrillation: Secondary | ICD-10-CM | POA: Diagnosis not present

## 2017-06-11 DIAGNOSIS — M150 Primary generalized (osteo)arthritis: Secondary | ICD-10-CM | POA: Diagnosis not present

## 2017-06-11 DIAGNOSIS — I10 Essential (primary) hypertension: Secondary | ICD-10-CM | POA: Diagnosis not present

## 2017-06-13 DIAGNOSIS — Z7901 Long term (current) use of anticoagulants: Secondary | ICD-10-CM | POA: Diagnosis not present

## 2017-06-13 DIAGNOSIS — I4891 Unspecified atrial fibrillation: Secondary | ICD-10-CM | POA: Diagnosis not present

## 2017-06-18 DIAGNOSIS — M069 Rheumatoid arthritis, unspecified: Secondary | ICD-10-CM | POA: Diagnosis not present

## 2017-06-18 DIAGNOSIS — I10 Essential (primary) hypertension: Secondary | ICD-10-CM | POA: Diagnosis not present

## 2017-06-18 DIAGNOSIS — Z7901 Long term (current) use of anticoagulants: Secondary | ICD-10-CM | POA: Diagnosis not present

## 2017-06-18 DIAGNOSIS — I4891 Unspecified atrial fibrillation: Secondary | ICD-10-CM | POA: Diagnosis not present

## 2017-06-18 DIAGNOSIS — Z0000 Encounter for general adult medical examination without abnormal findings: Secondary | ICD-10-CM | POA: Diagnosis not present

## 2017-07-18 DIAGNOSIS — I4891 Unspecified atrial fibrillation: Secondary | ICD-10-CM | POA: Diagnosis not present

## 2017-07-18 DIAGNOSIS — Z7901 Long term (current) use of anticoagulants: Secondary | ICD-10-CM | POA: Diagnosis not present

## 2017-08-02 ENCOUNTER — Encounter (INDEPENDENT_AMBULATORY_CARE_PROVIDER_SITE_OTHER): Payer: Medicare Other | Admitting: Ophthalmology

## 2017-08-02 DIAGNOSIS — H35033 Hypertensive retinopathy, bilateral: Secondary | ICD-10-CM | POA: Diagnosis not present

## 2017-08-02 DIAGNOSIS — H353211 Exudative age-related macular degeneration, right eye, with active choroidal neovascularization: Secondary | ICD-10-CM | POA: Diagnosis not present

## 2017-08-02 DIAGNOSIS — H43813 Vitreous degeneration, bilateral: Secondary | ICD-10-CM

## 2017-08-02 DIAGNOSIS — I10 Essential (primary) hypertension: Secondary | ICD-10-CM | POA: Diagnosis not present

## 2017-08-02 DIAGNOSIS — H353122 Nonexudative age-related macular degeneration, left eye, intermediate dry stage: Secondary | ICD-10-CM

## 2017-08-08 DIAGNOSIS — E785 Hyperlipidemia, unspecified: Secondary | ICD-10-CM | POA: Diagnosis not present

## 2017-08-08 DIAGNOSIS — I4892 Unspecified atrial flutter: Secondary | ICD-10-CM | POA: Diagnosis not present

## 2017-08-08 DIAGNOSIS — Z7901 Long term (current) use of anticoagulants: Secondary | ICD-10-CM | POA: Diagnosis not present

## 2017-08-08 DIAGNOSIS — I10 Essential (primary) hypertension: Secondary | ICD-10-CM | POA: Diagnosis not present

## 2017-08-08 DIAGNOSIS — I482 Chronic atrial fibrillation: Secondary | ICD-10-CM | POA: Diagnosis not present

## 2017-08-15 DIAGNOSIS — Z23 Encounter for immunization: Secondary | ICD-10-CM | POA: Diagnosis not present

## 2017-08-15 DIAGNOSIS — Z7901 Long term (current) use of anticoagulants: Secondary | ICD-10-CM | POA: Diagnosis not present

## 2017-08-15 DIAGNOSIS — I4891 Unspecified atrial fibrillation: Secondary | ICD-10-CM | POA: Diagnosis not present

## 2017-08-29 DIAGNOSIS — E663 Overweight: Secondary | ICD-10-CM | POA: Diagnosis not present

## 2017-08-29 DIAGNOSIS — M0589 Other rheumatoid arthritis with rheumatoid factor of multiple sites: Secondary | ICD-10-CM | POA: Diagnosis not present

## 2017-08-29 DIAGNOSIS — Z6826 Body mass index (BMI) 26.0-26.9, adult: Secondary | ICD-10-CM | POA: Diagnosis not present

## 2017-09-06 DIAGNOSIS — D3617 Benign neoplasm of peripheral nerves and autonomic nervous system of trunk, unspecified: Secondary | ICD-10-CM | POA: Diagnosis not present

## 2017-09-06 DIAGNOSIS — L812 Freckles: Secondary | ICD-10-CM | POA: Diagnosis not present

## 2017-09-06 DIAGNOSIS — Z85828 Personal history of other malignant neoplasm of skin: Secondary | ICD-10-CM | POA: Diagnosis not present

## 2017-09-06 DIAGNOSIS — L570 Actinic keratosis: Secondary | ICD-10-CM | POA: Diagnosis not present

## 2017-09-06 DIAGNOSIS — L821 Other seborrheic keratosis: Secondary | ICD-10-CM | POA: Diagnosis not present

## 2017-09-06 DIAGNOSIS — D485 Neoplasm of uncertain behavior of skin: Secondary | ICD-10-CM | POA: Diagnosis not present

## 2017-09-06 DIAGNOSIS — D692 Other nonthrombocytopenic purpura: Secondary | ICD-10-CM | POA: Diagnosis not present

## 2017-09-06 DIAGNOSIS — C44212 Basal cell carcinoma of skin of right ear and external auricular canal: Secondary | ICD-10-CM | POA: Diagnosis not present

## 2017-09-12 DIAGNOSIS — I4891 Unspecified atrial fibrillation: Secondary | ICD-10-CM | POA: Diagnosis not present

## 2017-09-12 DIAGNOSIS — Z7901 Long term (current) use of anticoagulants: Secondary | ICD-10-CM | POA: Diagnosis not present

## 2017-09-26 DIAGNOSIS — Z85828 Personal history of other malignant neoplasm of skin: Secondary | ICD-10-CM | POA: Diagnosis not present

## 2017-09-26 DIAGNOSIS — C44212 Basal cell carcinoma of skin of right ear and external auricular canal: Secondary | ICD-10-CM | POA: Diagnosis not present

## 2017-09-28 ENCOUNTER — Inpatient Hospital Stay (HOSPITAL_COMMUNITY)
Admission: EM | Admit: 2017-09-28 | Discharge: 2017-10-04 | DRG: 470 | Disposition: A | Discharge status: Skilled Nursing Facility | Payer: Medicare Other | Attending: Family Medicine | Admitting: Family Medicine

## 2017-09-28 ENCOUNTER — Other Ambulatory Visit: Payer: Self-pay

## 2017-09-28 ENCOUNTER — Emergency Department (HOSPITAL_COMMUNITY): Payer: Medicare Other

## 2017-09-28 ENCOUNTER — Inpatient Hospital Stay (HOSPITAL_COMMUNITY): Payer: Medicare Other

## 2017-09-28 ENCOUNTER — Encounter (HOSPITAL_COMMUNITY): Payer: Self-pay | Admitting: Emergency Medicine

## 2017-09-28 DIAGNOSIS — K2210 Ulcer of esophagus without bleeding: Secondary | ICD-10-CM | POA: Diagnosis present

## 2017-09-28 DIAGNOSIS — Z96649 Presence of unspecified artificial hip joint: Secondary | ICD-10-CM

## 2017-09-28 DIAGNOSIS — Z823 Family history of stroke: Secondary | ICD-10-CM | POA: Diagnosis not present

## 2017-09-28 DIAGNOSIS — S72002A Fracture of unspecified part of neck of left femur, initial encounter for closed fracture: Secondary | ICD-10-CM

## 2017-09-28 DIAGNOSIS — Z882 Allergy status to sulfonamides status: Secondary | ICD-10-CM

## 2017-09-28 DIAGNOSIS — S72092A Other fracture of head and neck of left femur, initial encounter for closed fracture: Principal | ICD-10-CM | POA: Diagnosis present

## 2017-09-28 DIAGNOSIS — M069 Rheumatoid arthritis, unspecified: Secondary | ICD-10-CM | POA: Diagnosis present

## 2017-09-28 DIAGNOSIS — Z66 Do not resuscitate: Secondary | ICD-10-CM | POA: Diagnosis present

## 2017-09-28 DIAGNOSIS — Z471 Aftercare following joint replacement surgery: Secondary | ICD-10-CM | POA: Diagnosis not present

## 2017-09-28 DIAGNOSIS — F17290 Nicotine dependence, other tobacco product, uncomplicated: Secondary | ICD-10-CM | POA: Diagnosis present

## 2017-09-28 DIAGNOSIS — D62 Acute posthemorrhagic anemia: Secondary | ICD-10-CM | POA: Diagnosis not present

## 2017-09-28 DIAGNOSIS — I482 Chronic atrial fibrillation: Secondary | ICD-10-CM | POA: Diagnosis present

## 2017-09-28 DIAGNOSIS — K208 Other esophagitis: Secondary | ICD-10-CM | POA: Diagnosis present

## 2017-09-28 DIAGNOSIS — K920 Hematemesis: Secondary | ICD-10-CM | POA: Diagnosis present

## 2017-09-28 DIAGNOSIS — D72829 Elevated white blood cell count, unspecified: Secondary | ICD-10-CM | POA: Diagnosis not present

## 2017-09-28 DIAGNOSIS — Z96642 Presence of left artificial hip joint: Secondary | ICD-10-CM | POA: Diagnosis not present

## 2017-09-28 DIAGNOSIS — M6281 Muscle weakness (generalized): Secondary | ICD-10-CM | POA: Diagnosis not present

## 2017-09-28 DIAGNOSIS — Z01818 Encounter for other preprocedural examination: Secondary | ICD-10-CM

## 2017-09-28 DIAGNOSIS — R262 Difficulty in walking, not elsewhere classified: Secondary | ICD-10-CM | POA: Diagnosis not present

## 2017-09-28 DIAGNOSIS — I10 Essential (primary) hypertension: Secondary | ICD-10-CM | POA: Diagnosis not present

## 2017-09-28 DIAGNOSIS — M25562 Pain in left knee: Secondary | ICD-10-CM | POA: Diagnosis not present

## 2017-09-28 DIAGNOSIS — E871 Hypo-osmolality and hyponatremia: Secondary | ICD-10-CM | POA: Diagnosis not present

## 2017-09-28 DIAGNOSIS — W010XXA Fall on same level from slipping, tripping and stumbling without subsequent striking against object, initial encounter: Secondary | ICD-10-CM | POA: Diagnosis not present

## 2017-09-28 DIAGNOSIS — D649 Anemia, unspecified: Secondary | ICD-10-CM | POA: Diagnosis present

## 2017-09-28 DIAGNOSIS — Y92009 Unspecified place in unspecified non-institutional (private) residence as the place of occurrence of the external cause: Secondary | ICD-10-CM | POA: Diagnosis not present

## 2017-09-28 DIAGNOSIS — I4891 Unspecified atrial fibrillation: Secondary | ICD-10-CM | POA: Diagnosis present

## 2017-09-28 DIAGNOSIS — Z91013 Allergy to seafood: Secondary | ICD-10-CM

## 2017-09-28 DIAGNOSIS — R278 Other lack of coordination: Secondary | ICD-10-CM | POA: Diagnosis not present

## 2017-09-28 DIAGNOSIS — K228 Other specified diseases of esophagus: Secondary | ICD-10-CM | POA: Diagnosis not present

## 2017-09-28 DIAGNOSIS — S72009A Fracture of unspecified part of neck of unspecified femur, initial encounter for closed fracture: Secondary | ICD-10-CM | POA: Diagnosis not present

## 2017-09-28 DIAGNOSIS — S0990XA Unspecified injury of head, initial encounter: Secondary | ICD-10-CM | POA: Diagnosis not present

## 2017-09-28 DIAGNOSIS — Z7901 Long term (current) use of anticoagulants: Secondary | ICD-10-CM | POA: Diagnosis not present

## 2017-09-28 DIAGNOSIS — I4892 Unspecified atrial flutter: Secondary | ICD-10-CM | POA: Diagnosis present

## 2017-09-28 DIAGNOSIS — S72042A Displaced fracture of base of neck of left femur, initial encounter for closed fracture: Secondary | ICD-10-CM | POA: Diagnosis not present

## 2017-09-28 DIAGNOSIS — M79652 Pain in left thigh: Secondary | ICD-10-CM | POA: Diagnosis not present

## 2017-09-28 DIAGNOSIS — K922 Gastrointestinal hemorrhage, unspecified: Secondary | ICD-10-CM | POA: Diagnosis not present

## 2017-09-28 DIAGNOSIS — T148XXA Other injury of unspecified body region, initial encounter: Secondary | ICD-10-CM | POA: Diagnosis not present

## 2017-09-28 DIAGNOSIS — G8911 Acute pain due to trauma: Secondary | ICD-10-CM | POA: Diagnosis not present

## 2017-09-28 DIAGNOSIS — K222 Esophageal obstruction: Secondary | ICD-10-CM | POA: Diagnosis present

## 2017-09-28 DIAGNOSIS — R0989 Other specified symptoms and signs involving the circulatory and respiratory systems: Secondary | ICD-10-CM | POA: Diagnosis not present

## 2017-09-28 LAB — BASIC METABOLIC PANEL
Anion gap: 7 (ref 5–15)
BUN: 17 mg/dL (ref 6–20)
CHLORIDE: 101 mmol/L (ref 101–111)
CO2: 24 mmol/L (ref 22–32)
CREATININE: 1.01 mg/dL (ref 0.61–1.24)
Calcium: 8.8 mg/dL — ABNORMAL LOW (ref 8.9–10.3)
GFR calc non Af Amer: >60 mL/min (ref >60)
GLUCOSE: 117 mg/dL — AB (ref 65–99)
Potassium: 4.2 mmol/L (ref 3.5–5.1)
Sodium: 132 mmol/L — ABNORMAL LOW (ref 135–145)

## 2017-09-28 LAB — CBC WITH DIFFERENTIAL/PLATELET
BASOS PCT: 0 %
Basophils Absolute: 0 10*3/uL (ref 0.0–0.1)
EOS PCT: 0 %
Eosinophils Absolute: 0 10*3/uL (ref 0.0–0.7)
HCT: 35.1 % — ABNORMAL LOW (ref 39.0–52.0)
Hemoglobin: 12.2 g/dL — ABNORMAL LOW (ref 13.0–17.0)
LYMPHS ABS: 0.5 10*3/uL — AB (ref 0.7–4.0)
Lymphocytes Relative: 4 %
MCH: 33.2 pg (ref 26.0–34.0)
MCHC: 34.8 g/dL (ref 30.0–36.0)
MCV: 95.6 fL (ref 78.0–100.0)
MONO ABS: 1.3 10*3/uL — AB (ref 0.1–1.0)
MONOS PCT: 10 %
Neutro Abs: 11.9 10*3/uL — ABNORMAL HIGH (ref 1.7–7.7)
Neutrophils Relative %: 86 %
Platelets: 194 10*3/uL (ref 150–400)
RBC: 3.67 MIL/uL — ABNORMAL LOW (ref 4.22–5.81)
RDW: 15.2 % (ref 11.5–15.5)
WBC: 13.8 10*3/uL — ABNORMAL HIGH (ref 4.0–10.5)

## 2017-09-28 LAB — SURGICAL PCR SCREEN
MRSA, PCR: NEGATIVE
Staphylococcus aureus: NEGATIVE

## 2017-09-28 LAB — TYPE AND SCREEN
ABO/RH(D): O NEG
Antibody Screen: NEGATIVE

## 2017-09-28 LAB — PROTIME-INR
INR: 2.26
Prothrombin Time: 24.7 seconds — ABNORMAL HIGH (ref 11.4–15.2)

## 2017-09-28 LAB — ABO/RH: ABO/RH(D): O NEG

## 2017-09-28 IMAGING — DX DG CHEST 1V PORT
1 series · 1 of 1 positions shown · non-contrast
Comparison: [DATE]

CLINICAL DATA: Preoperative evaluation for hip surgery

EXAM:
PORTABLE CHEST 1 VIEW

[chest ap]
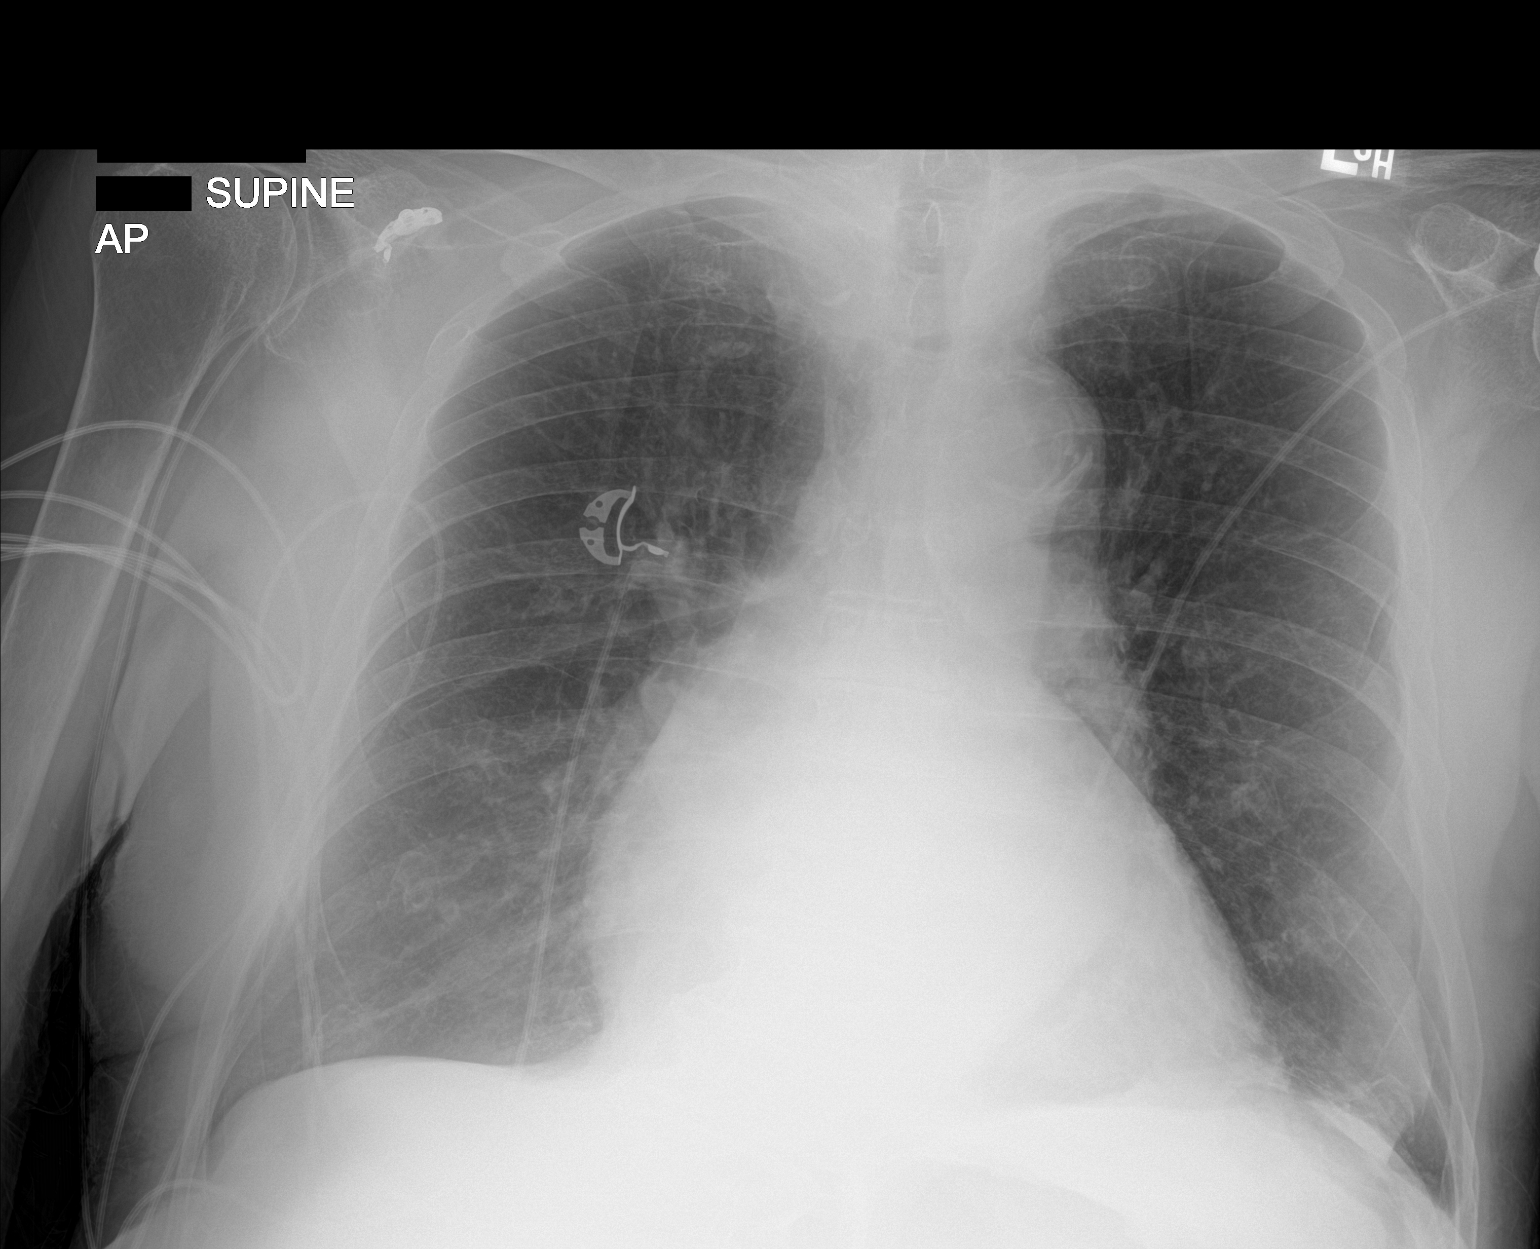

[1 of 1 positions shown; findings below may reference images not displayed]

FINDINGS: Cardiac shadow is mildly prominent but stable from the prior exam.
Aortic calcifications are again seen. Lungs are well aerated
bilaterally with minimal scarring in the left base. No acute bony
abnormality is seen.
IMPRESSION: No acute abnormality noted.

## 2017-09-28 IMAGING — CT CT HEAD W/O CM
3 of 4 series · 14 of 47 positions shown, 16 images · non-contrast
Comparison: CT head dated [DATE].

CLINICAL DATA: Fall.  Patient denies head injury.

EXAM:
CT HEAD WITHOUT CONTRAST
TECHNIQUE: Contiguous axial images were obtained from the base of the skull
through the vertex without intravenous contrast.

[Series 2: head w/o · axial · non-contrast · 0.51mm/px · z∈[-142,-7]mm · 8 of 33 slices shown, 10 images]
[im 3/33  brain]
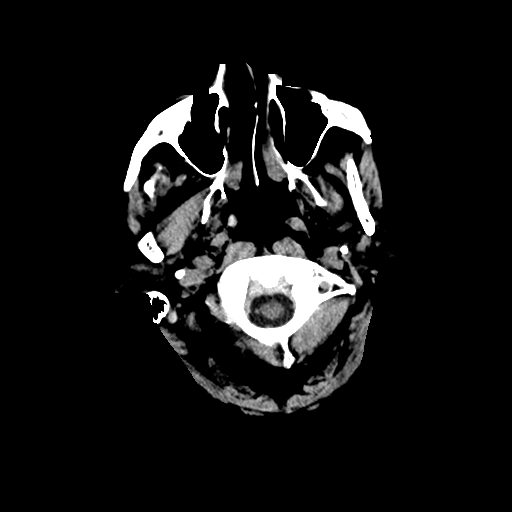
[im 3/33  bone]
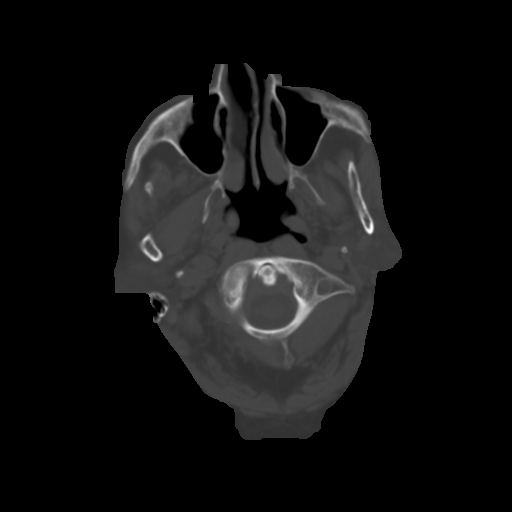
[im 7/33  brain]
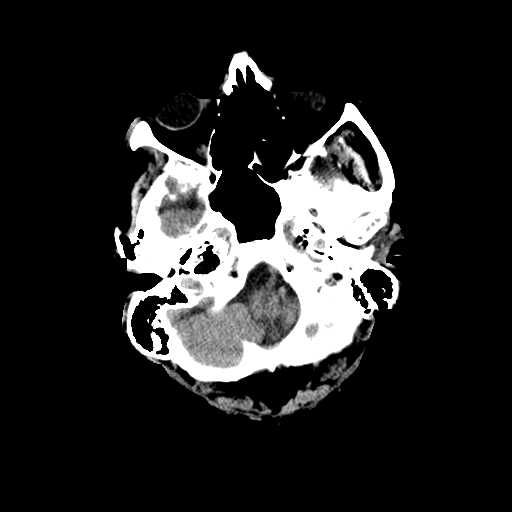
[im 12/33  brain]
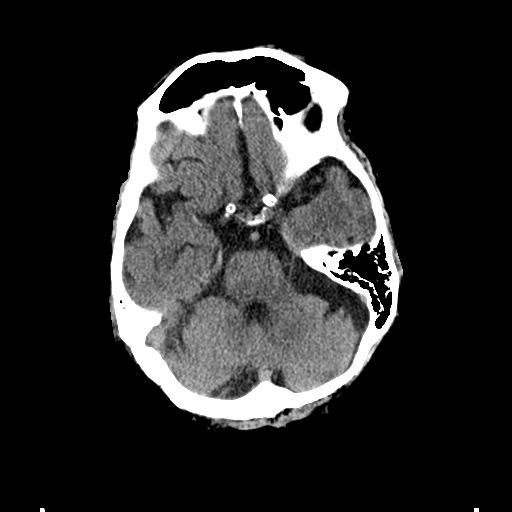
[im 14/33  brain]
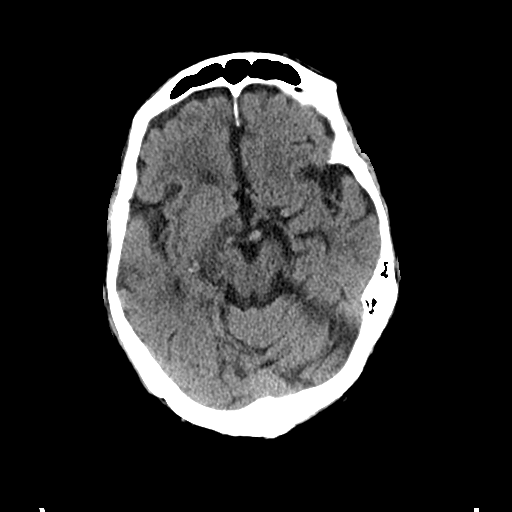
[im 19/33  brain]
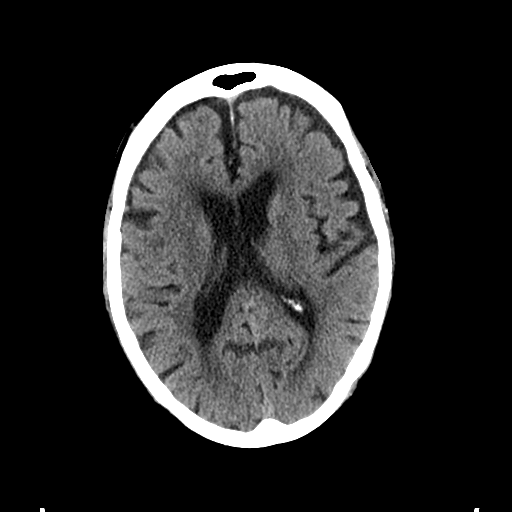
[im 19/33  bone]
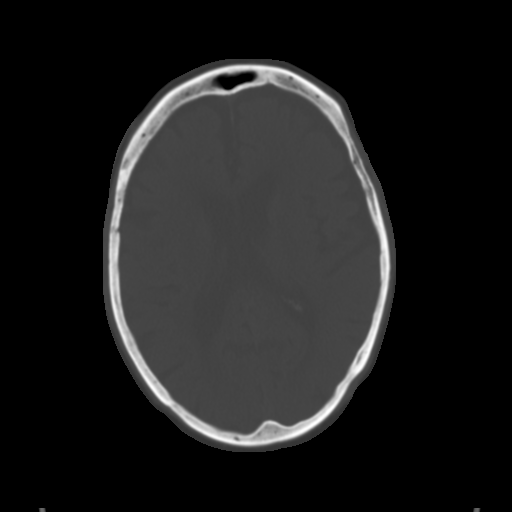
[im 21/33  brain]
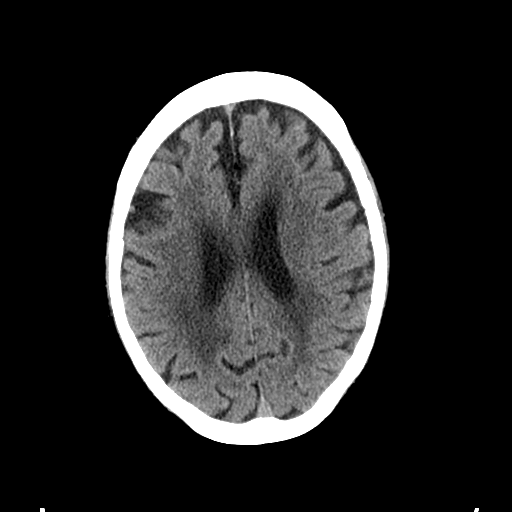
[im 26/33  brain]
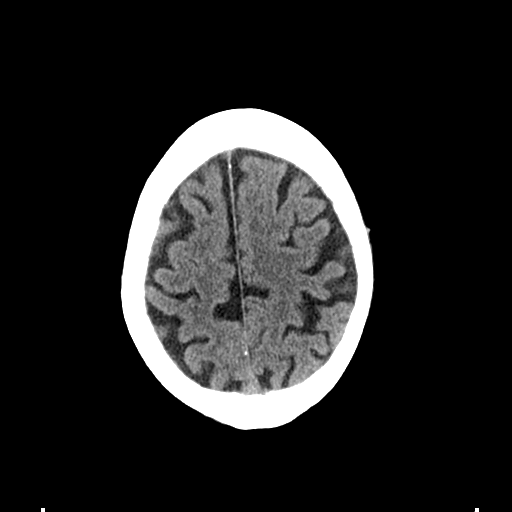
[im 30/33  brain]
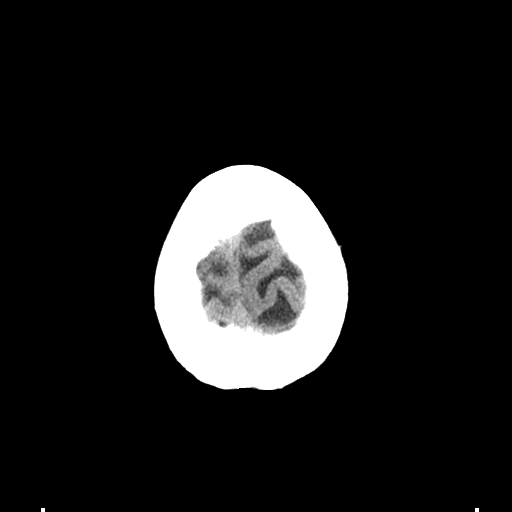

[Series 4: coronal · coronal · 0.36mm/px · 3 of 77 slices shown]
[im 26/77  brain]
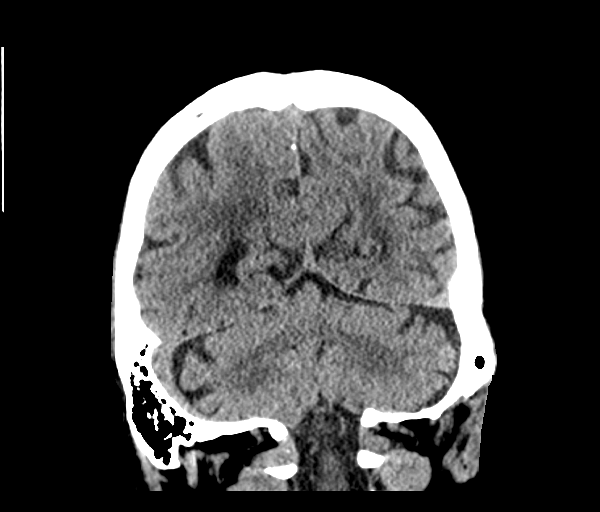
[im 34/77  brain]
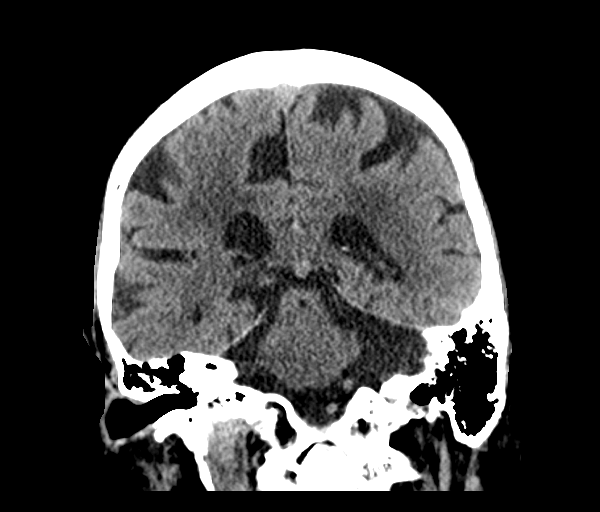
[im 43/77  brain]
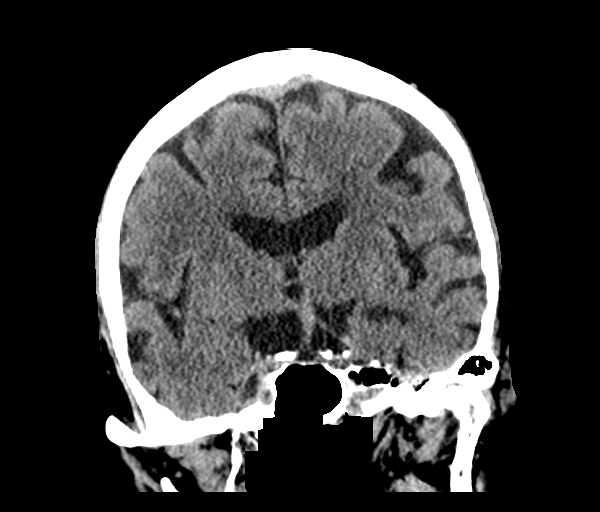

[Series 5: sagittal · sagittal · 0.31mm/px · 3 of 77 slices shown]
[im 26/77  brain]
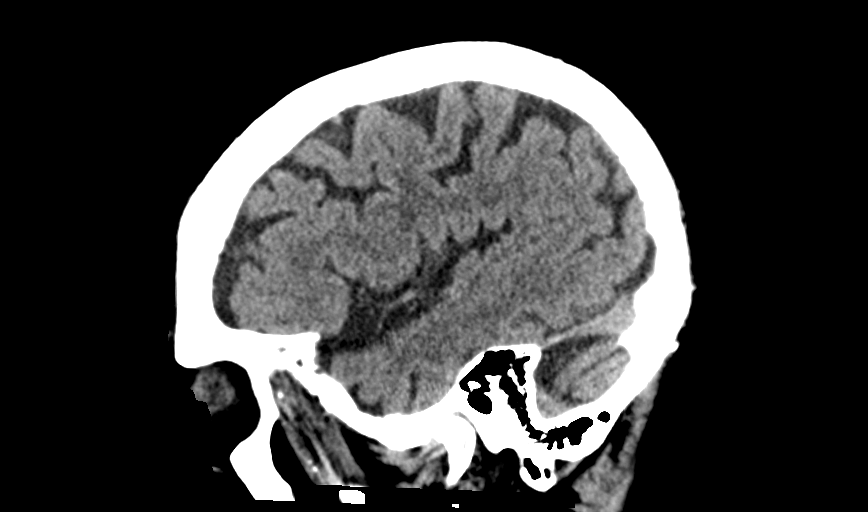
[im 39/77  brain]
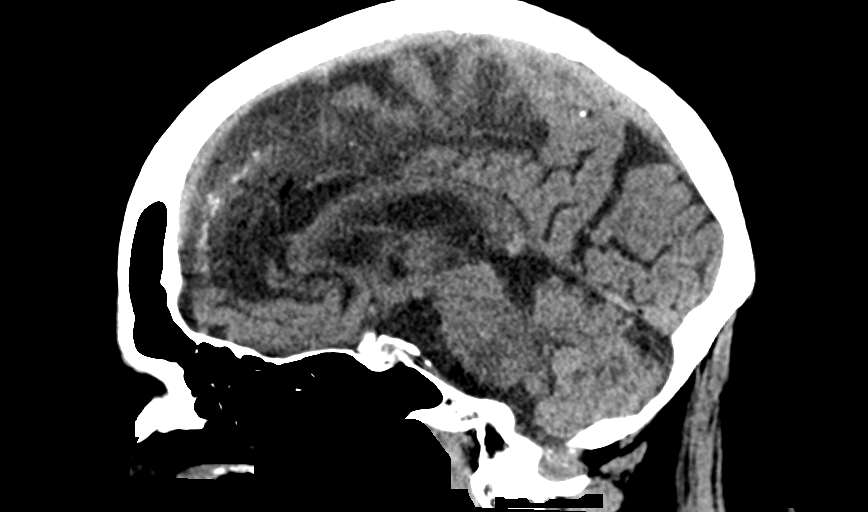
[im 51/77  brain]
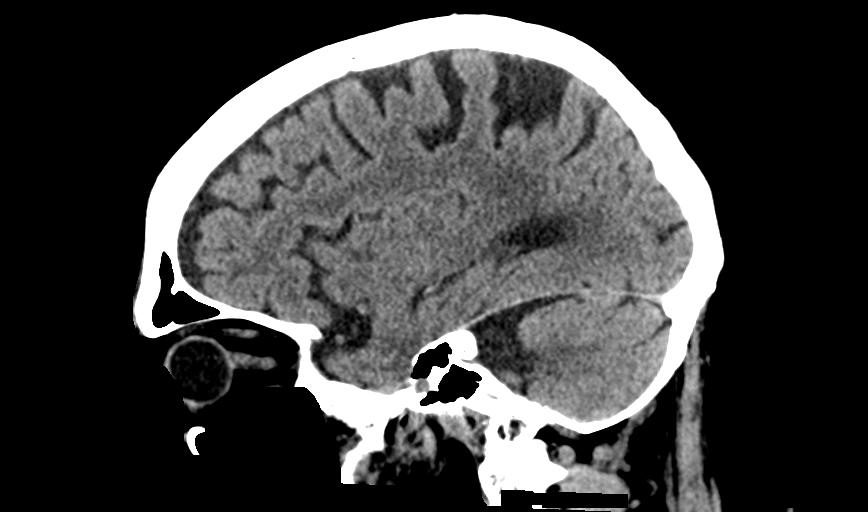

[14 of 47 positions shown; findings below may reference images not displayed]

FINDINGS: Brain: No evidence of acute infarction, hemorrhage, hydrocephalus,
extra-axial collection or mass lesion/mass effect. Stable moderate
cerebral atrophy and chronic microvascular ischemic white matter
disease.

Vascular: No hyperdense vessel or unexpected calcification.

Skull: Negative for fracture or focal lesion.

Sinuses/Orbits: No acute finding.

Other: None.
IMPRESSION: 1. No acute intracranial abnormality. Stable moderate cerebral
atrophy and chronic microvascular ischemic white matter disease.

## 2017-09-28 IMAGING — CR DG HIP (WITH OR WITHOUT PELVIS) 2-3V*L*
4 series · 4 of 4 positions shown · non-contrast
Comparison: None.

CLINICAL DATA: Fall.  Left hip pain.

EXAM:
DG HIP (WITH OR WITHOUT PELVIS) 2-3V LEFT

[w hip lat left]
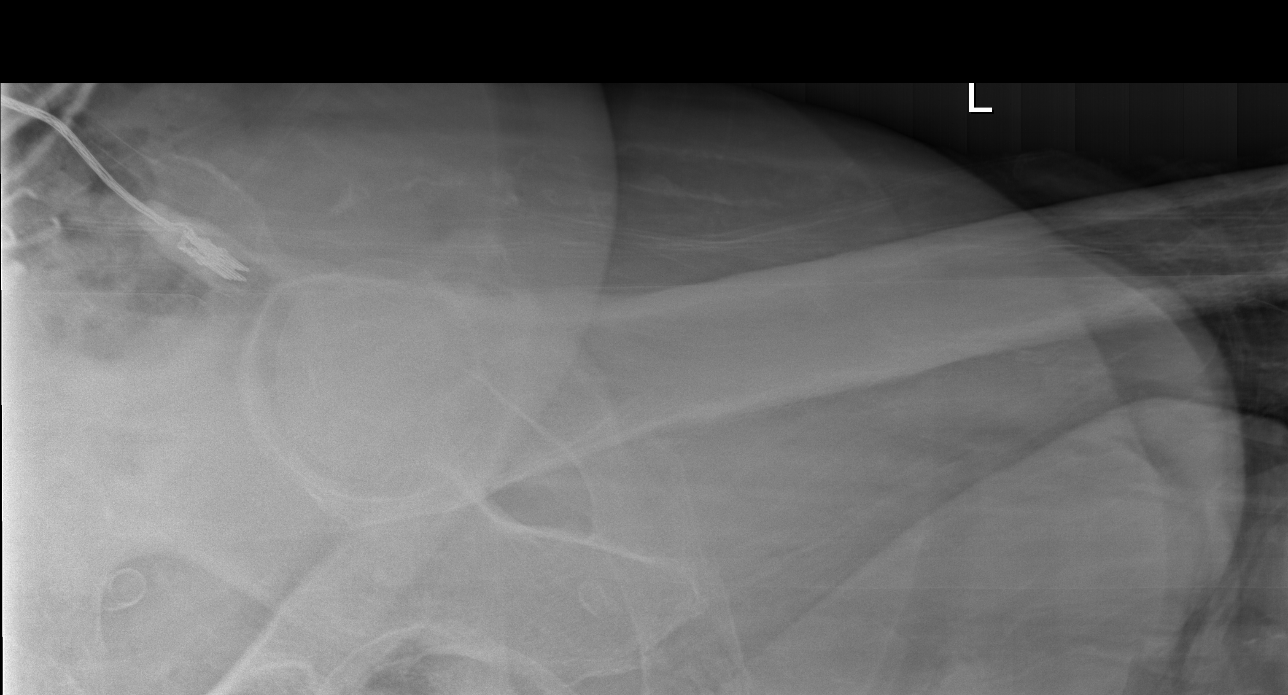

[x pelvis (1 of 2)]
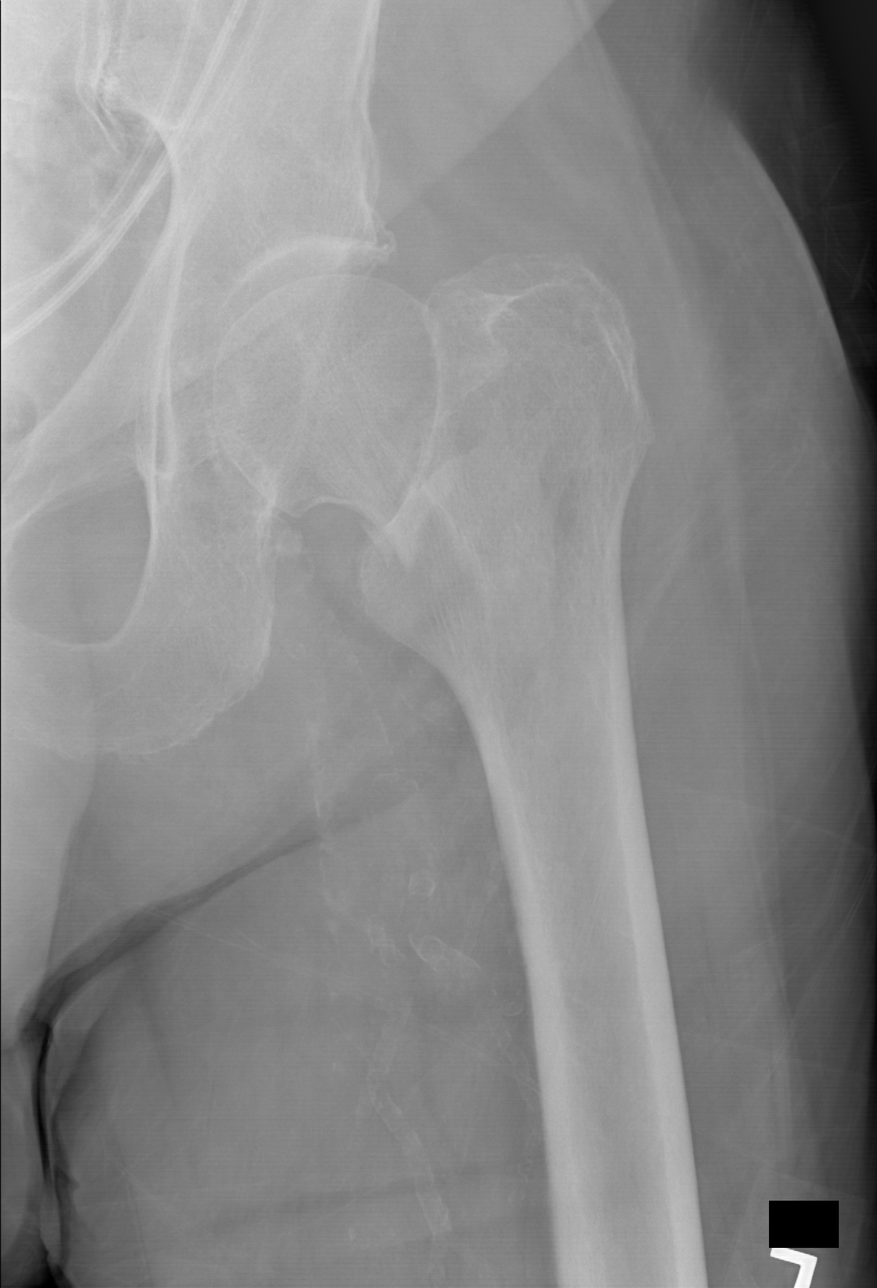

[x hip ap left]
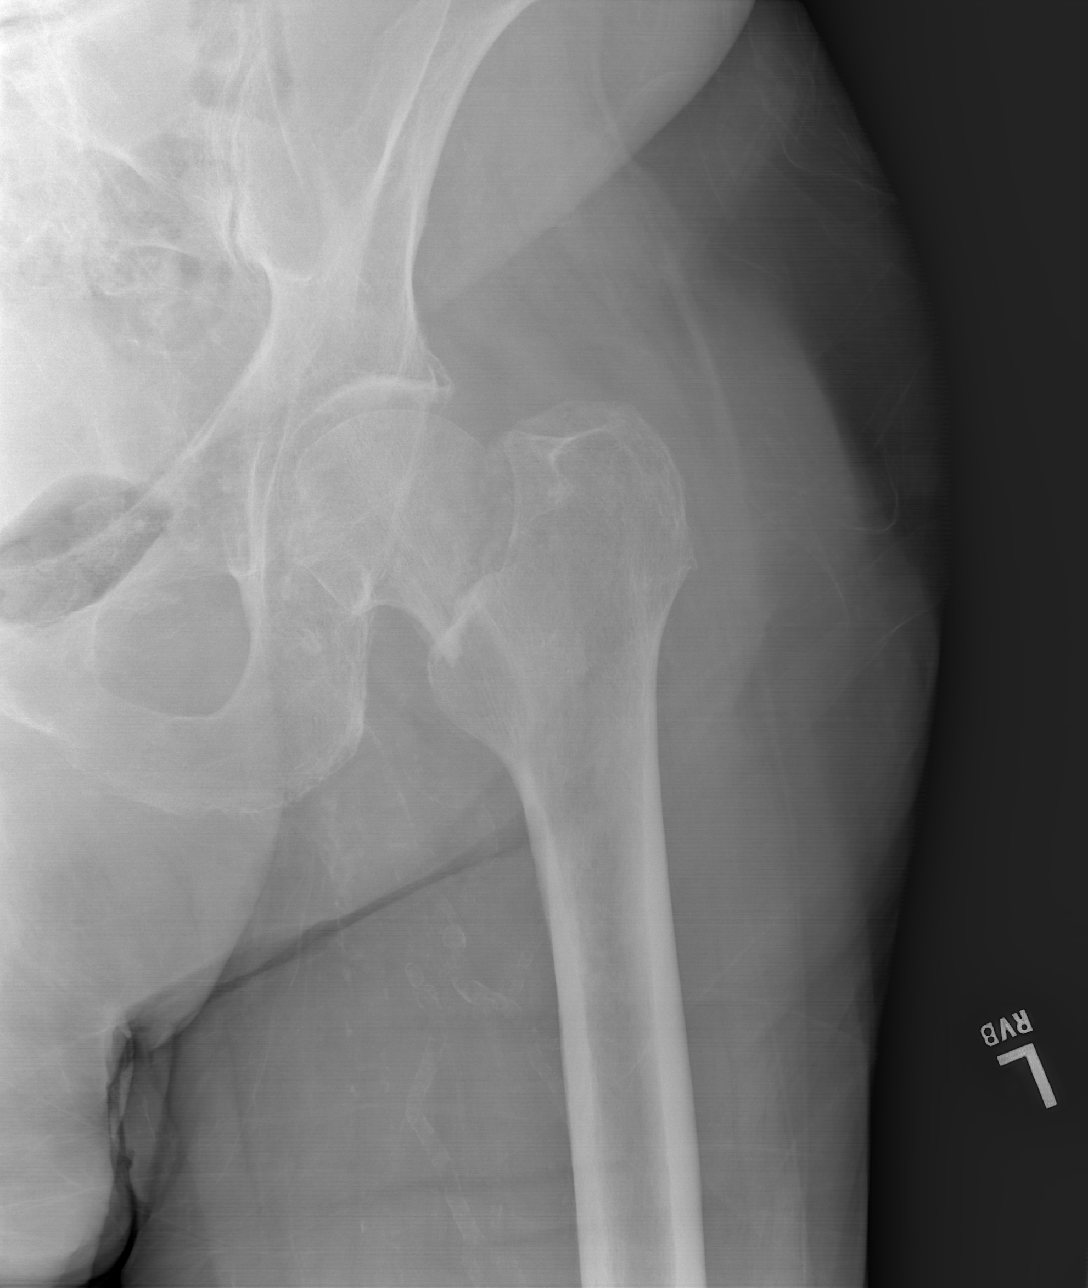

[x pelvis (2 of 2)]
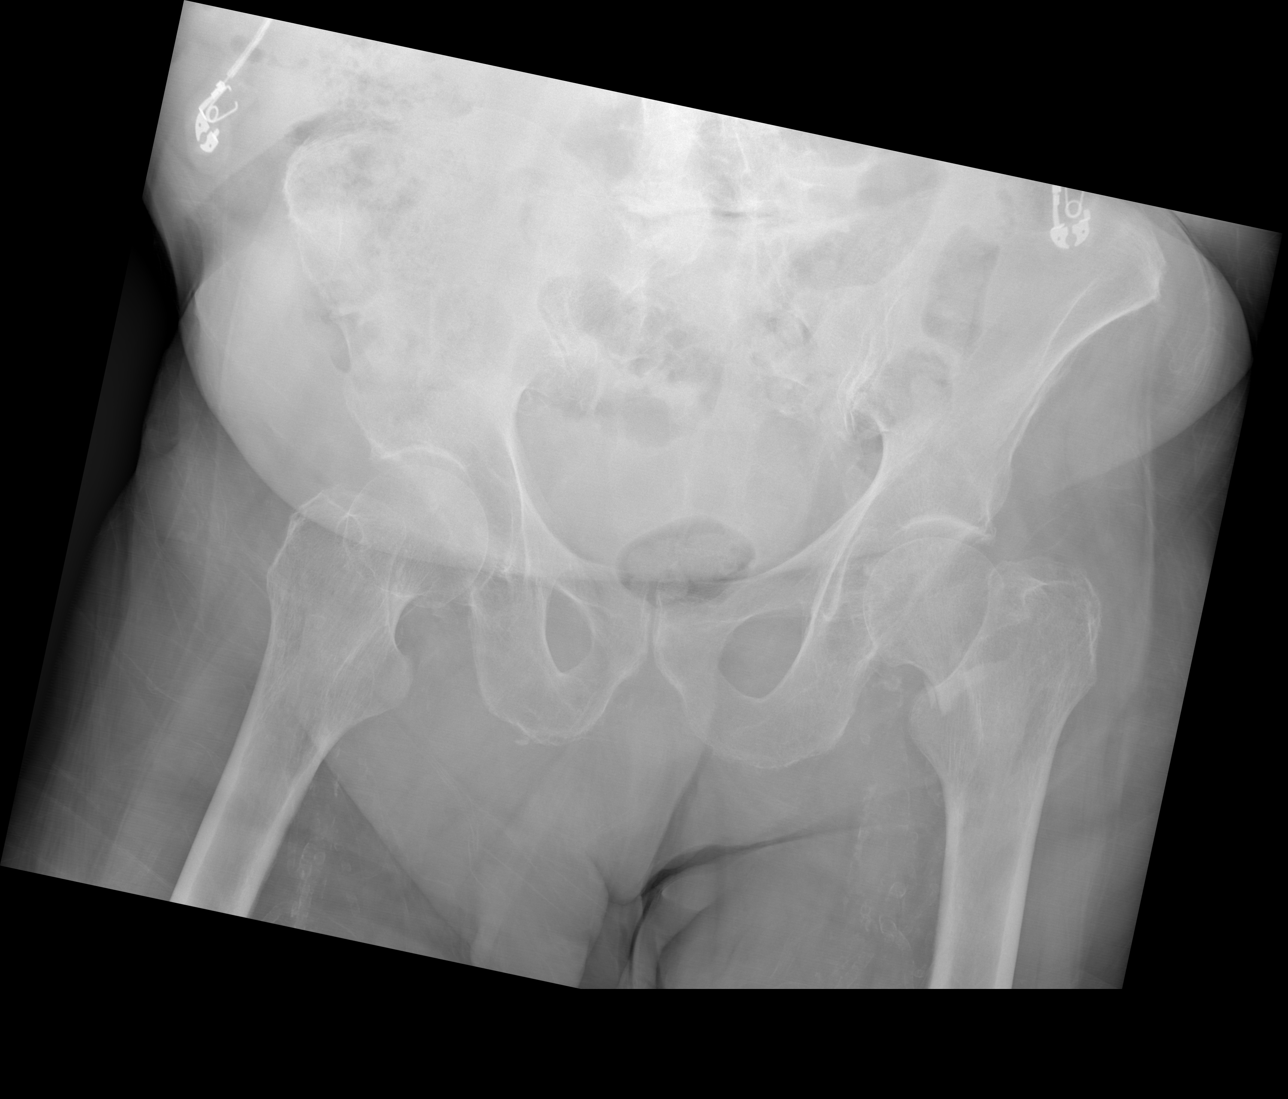

[4 of 4 positions shown; findings below may reference images not displayed]

FINDINGS: There is a left basicervical femoral neck fracture. No subluxation
or dislocation. Mild degenerative changes in the hips bilaterally.
SI joints are symmetric and unremarkable.
IMPRESSION: Left basicervical femoral neck fracture.

## 2017-09-28 IMAGING — CR DG FEMUR 2+V*L*
6 series · 6 of 6 positions shown · non-contrast
Comparison: None.

CLINICAL DATA: Fall with left mid thigh pain.  Initial encounter.

EXAM:
LEFT FEMUR 2 VIEWS

[w hip lat left (1 of 2)]
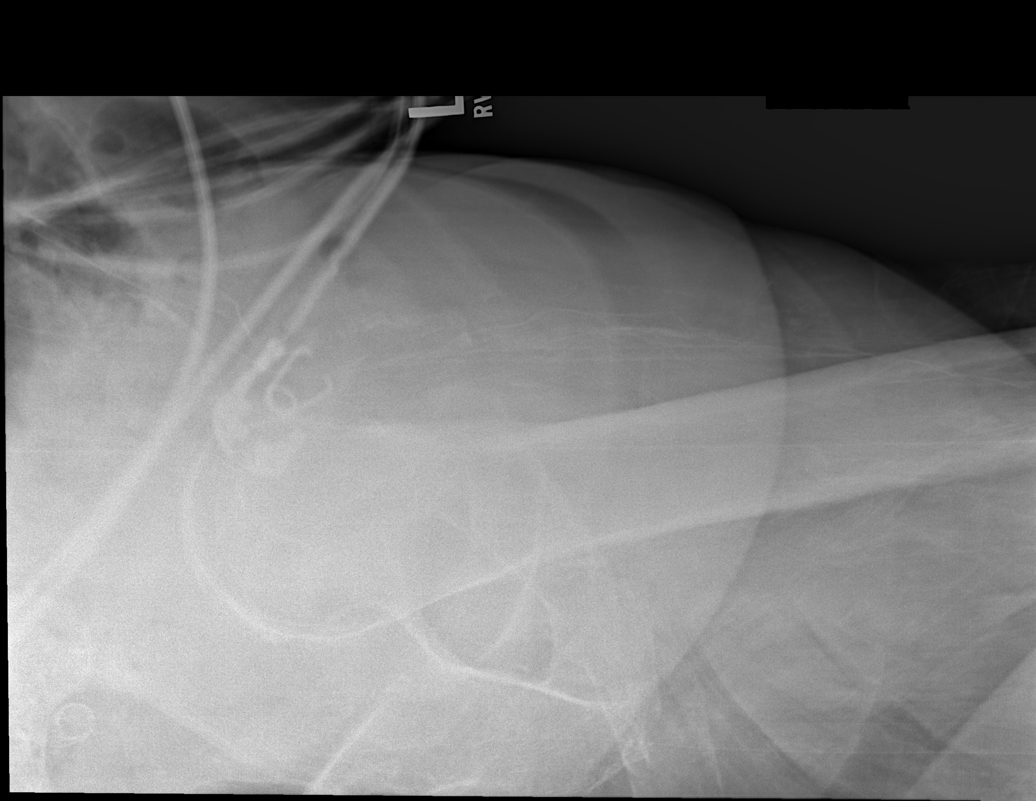

[w hip lat left (2 of 2)]
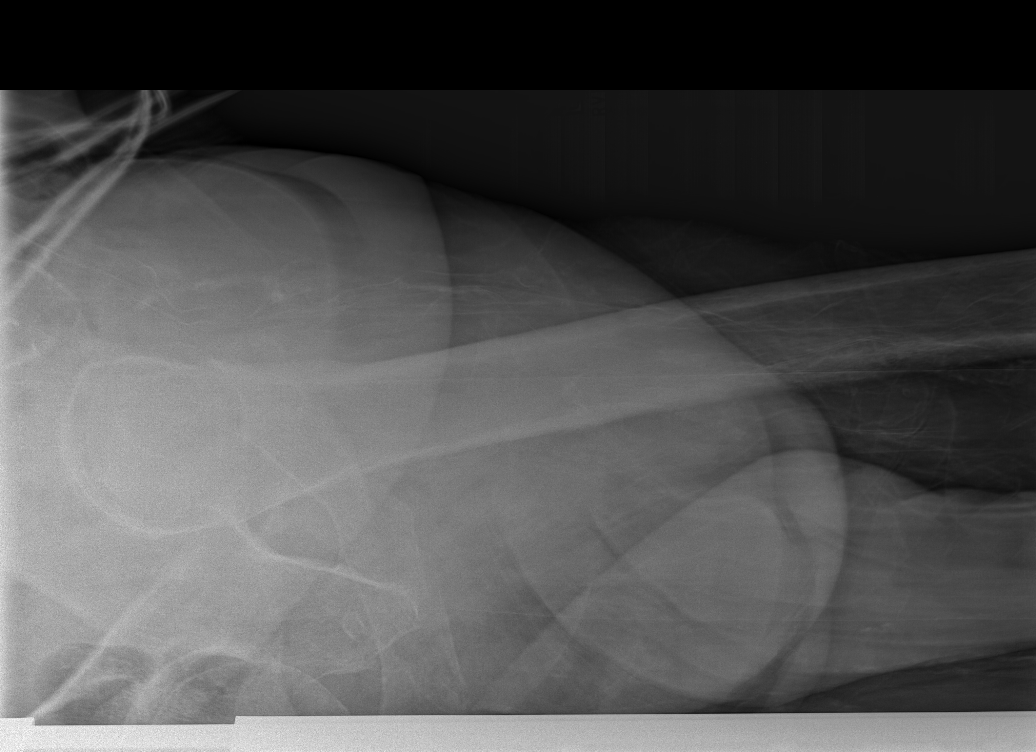

[x femur proximal ap left (1 of 2)]
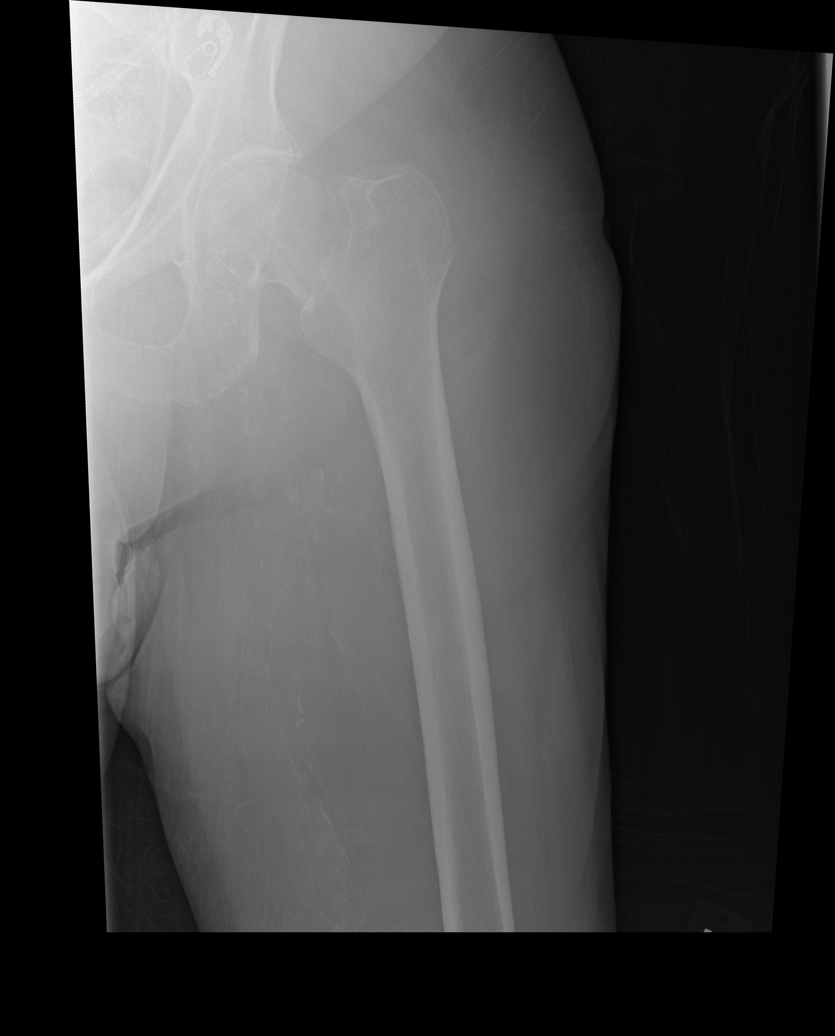

[x femur proximal ap left (2 of 2)]
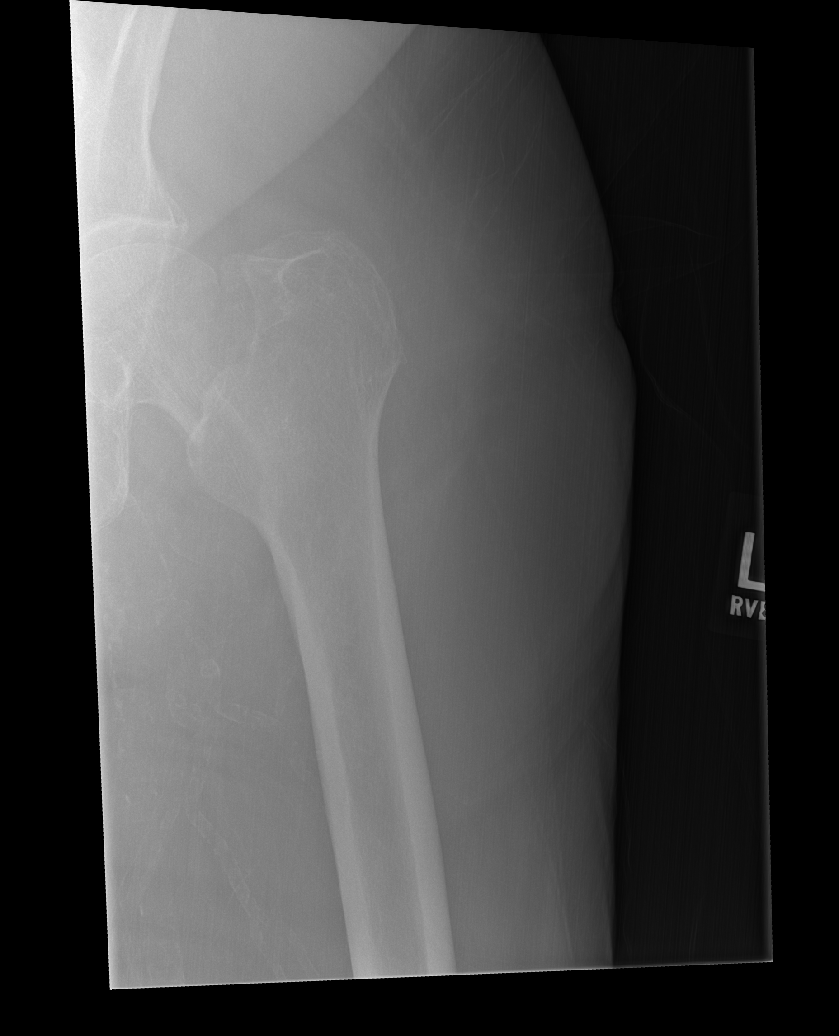

[x femur distal ap left]
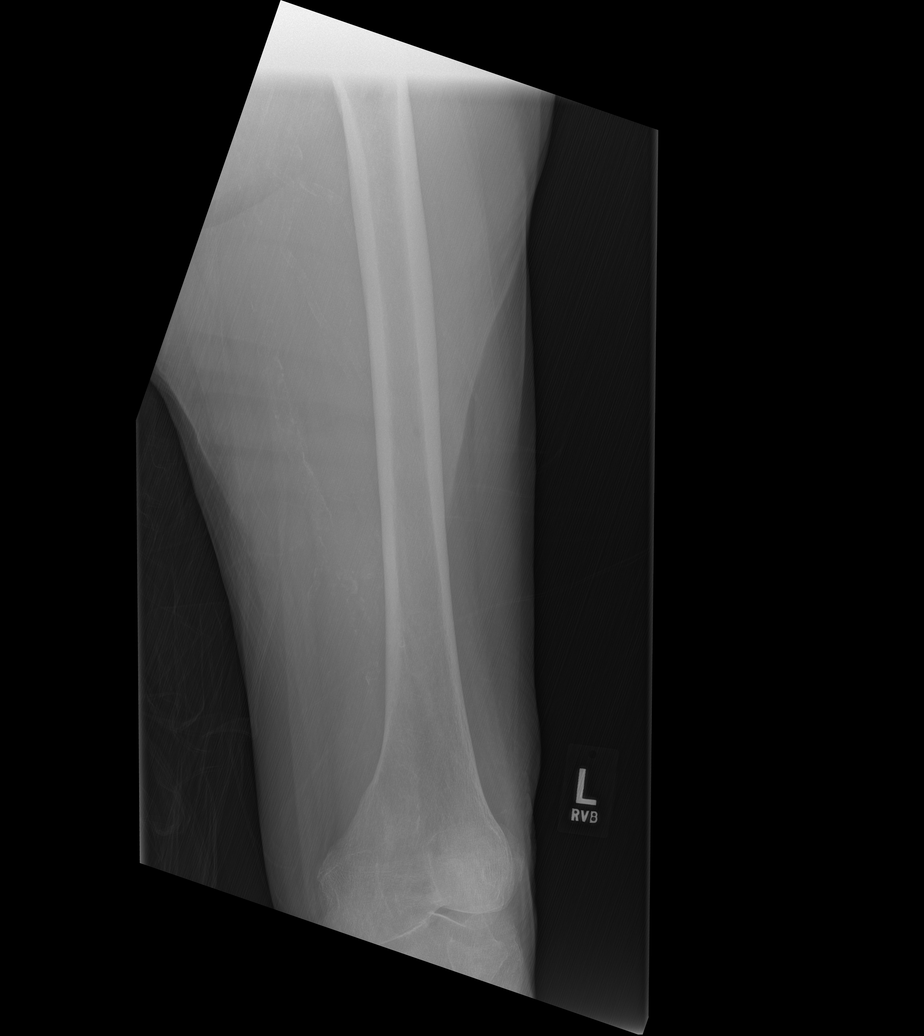

[x femur distal lat left]
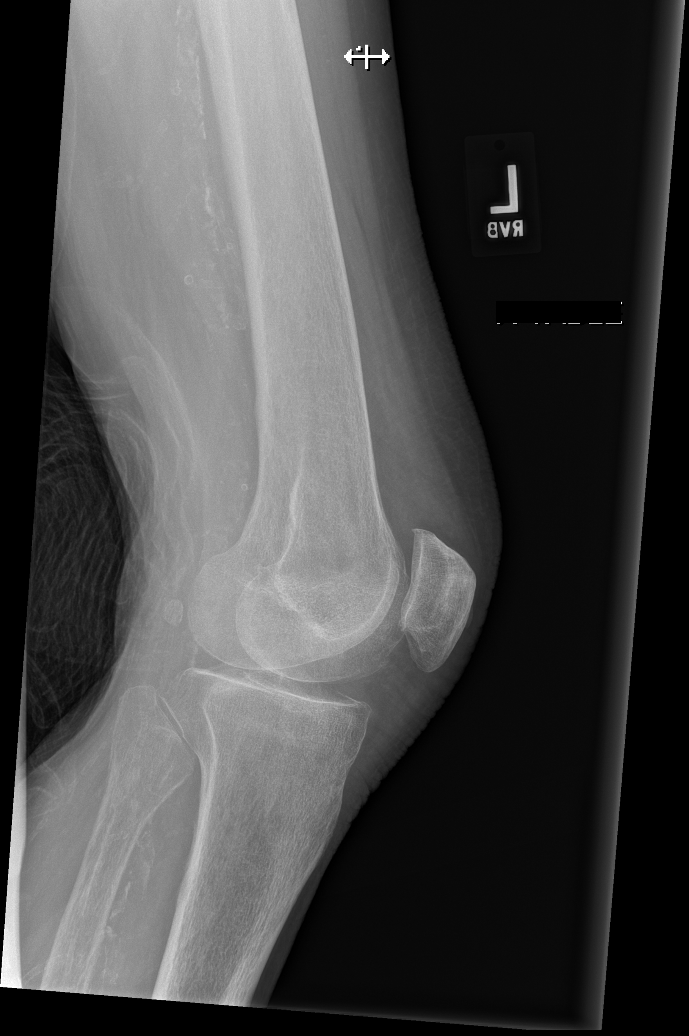

[6 of 6 positions shown; findings below may reference images not displayed]

FINDINGS: There appears to be a minimally displaced subcapital fracture of the
proximal left femur. The uppermost images are not exactly centered
over the hip and further evaluation with a dedicated hip series
would be helpful. The rest of the femur appears intact without
fracture or lesion. Extensive vascular calcifications are present.
IMPRESSION: Probable subcapital hip fracture. However, the femur films are not
well centered over the hip joint and it is recommended that
additional correlation be performed with a left hip x-ray series.

## 2017-09-28 MED ORDER — MORPHINE SULFATE (PF) 2 MG/ML IV SOLN
0.5000 mg | INTRAVENOUS | Status: DC | PRN
  Administered 2017-09-28: 0.5 mg via INTRAVENOUS
  Filled 2017-09-28: qty 1

## 2017-09-28 MED ORDER — METHOCARBAMOL 500 MG PO TABS: 500.0000 mg | ORAL_TABLET | Freq: Four times a day (QID) | ORAL | Status: DC | PRN

## 2017-09-28 MED ORDER — METHOCARBAMOL 1000 MG/10ML IJ SOLN
500.0000 mg | Freq: Four times a day (QID) | INTRAVENOUS | Status: DC | PRN
  Filled 2017-09-28 (×2): qty 5

## 2017-09-28 MED ORDER — CEFAZOLIN SODIUM-DEXTROSE 2-4 GM/100ML-% IV SOLN
2.0000 g | INTRAVENOUS | Status: AC
  Administered 2017-09-29: 2 g via INTRAVENOUS
  Filled 2017-09-28: qty 100

## 2017-09-28 MED ORDER — LACTATED RINGERS IV SOLN
INTRAVENOUS | Status: DC
  Administered 2017-09-29 (×4): via INTRAVENOUS

## 2017-09-28 MED ORDER — DOCUSATE SODIUM 100 MG PO CAPS
100.0000 mg | ORAL_CAPSULE | Freq: Two times a day (BID) | ORAL | Status: DC
  Administered 2017-09-28 – 2017-10-02 (×6): 100 mg via ORAL
  Filled 2017-09-28 (×10): qty 1

## 2017-09-28 MED ORDER — SODIUM CHLORIDE 0.9 % IV SOLN
INTRAVENOUS | Status: DC
  Administered 2017-09-28: 20:00:00 via INTRAVENOUS

## 2017-09-28 MED ORDER — DILTIAZEM HCL ER COATED BEADS 240 MG PO CP24
360.0000 mg | ORAL_CAPSULE | Freq: Every day | ORAL | Status: DC
  Administered 2017-09-29 – 2017-10-04 (×6): 360 mg via ORAL
  Filled 2017-09-28 (×6): qty 1

## 2017-09-28 MED ORDER — CHLORHEXIDINE GLUCONATE 4 % EX LIQD
60.0000 mL | Freq: Once | CUTANEOUS | Status: AC
  Administered 2017-09-29: 4 via TOPICAL
  Filled 2017-09-28 (×2): qty 60

## 2017-09-28 MED ORDER — HYDROCODONE-ACETAMINOPHEN 5-325 MG PO TABS
1.0000 | ORAL_TABLET | Freq: Once | ORAL | Status: AC
  Administered 2017-09-28: 1 via ORAL
  Filled 2017-09-28: qty 1

## 2017-09-28 MED ORDER — HYDROCODONE-ACETAMINOPHEN 5-325 MG PO TABS
1.0000 | ORAL_TABLET | Freq: Four times a day (QID) | ORAL | Status: DC | PRN
  Administered 2017-09-28 – 2017-09-29 (×2): 2 via ORAL
  Administered 2017-09-30: 1 via ORAL
  Filled 2017-09-28: qty 1
  Filled 2017-09-28 (×2): qty 2

## 2017-09-28 MED ORDER — METOPROLOL SUCCINATE ER 50 MG PO TB24
50.0000 mg | ORAL_TABLET | Freq: Every day | ORAL | Status: DC
  Administered 2017-09-29 – 2017-10-04 (×6): 50 mg via ORAL
  Filled 2017-09-28 (×6): qty 1

## 2017-09-28 MED ORDER — MORPHINE SULFATE (PF) 4 MG/ML IV SOLN
0.5000 mg | INTRAVENOUS | Status: DC | PRN
  Administered 2017-09-28: 0.52 mg via INTRAVENOUS
  Filled 2017-09-28: qty 1

## 2017-09-28 MED ORDER — DEXTROSE 5 % IV SOLN
5.0000 mg | Freq: Once | INTRAVENOUS | Status: AC
  Administered 2017-09-28: 5 mg via INTRAVENOUS
  Filled 2017-09-28: qty 0.5

## 2017-09-28 MED ORDER — POVIDONE-IODINE 10 % EX SWAB
2.0000 | Freq: Once | CUTANEOUS | Status: AC
Start: 2017-09-28 — End: 2017-09-29
  Administered 2017-09-29: 2 via TOPICAL

## 2017-09-28 NOTE — ED Provider Notes (Signed)
[facility] DEPT Provider Note   CSN: [id number] Arrival date & time: 09/28/17  1008     History   Chief Complaint Chief Complaint  Patient presents with  . Fall    HPI [name] is a 83 y.o. male.  HPI  83 year old male with a history of A. fib on warfarin presents with left thigh pain after a fall.  Patient states he was in his house and was twisting and turning to the left while also switching hands with his cane and he fell down to the ground.  Landed on his left thigh and knee.  He is been unable to get up since.  States there is no pain in his ankle, lower leg, or knee but all the pain is in his mid thigh.  No head injury or other injuries.  No weakness or numbness.  Pain is minimal at rest but unbearable when trying to get up.  Past Medical History:  Diagnosis Date  . A-fib ([place])   . Dysrhythmia    Atrial Tachycardia with block  . Erosive esophagitis   . GERD (gastroesophageal reflux disease)   . H/O fracture of rib    Left  5th, 6th, 7th had a fall in APRIL 2016  . Hypertension   . RA (rheumatoid arthritis) [facility])     Patient Active Problem List   Diagnosis Date Noted  . Dysphagia 05/04/2015  . Stricture and stenosis of esophagus 05/04/2015  . GI bleed 03/12/2015  . Anemia 03/12/2015  . Left rib fracture- 5th, 6th, 7th 03/12/2015  . Hematemesis 03/12/2015  . Hyponatremia 03/11/2015  . UTI (lower urinary tract infection) 03/11/2015  . A-fib ([place]) 03/11/2015  . Weakness 03/11/2015  . Scalp hematoma 03/11/2015  . Warfarin-induced coagulopathy ([place]) 03/11/2015    Past Surgical History:  Procedure Laterality Date  . BALLOON DILATION N/A 05/04/2015   Performed by Wilford Corner, MD at [facility] ENDOSCOPY  . CATARACT EXTRACTION    . ESOPHAGOGASTRODUODENOSCOPY (EGD) N/A 05/04/2015   Performed by Wilford Corner, MD at [place]  . ESOPHAGOGASTRODUODENOSCOPY (EGD) N/A 03/12/2015   Performed by Wilford Corner, MD at [place]  . EYE SURGERY Bilateral 10/2015  . SAVORY DILATION N/A 05/04/2015   Performed by Wilford Corner, MD at [place] Medications    Prior to Admission medications   Medication Sig Start Date End Date Taking? Authorizing Provider  cephALEXin (KEFLEX) 500 MG capsule Take 500 mg 3 (three) times daily by mouth. 09/26/17  Yes [provider]  diltiazem (TIAZAC) 360 MG 24 hr capsule Take 360 mg by mouth daily. 02/20/15  Yes [provider]  metoprolol succinate (TOPROL-XL) 50 MG 24 hr tablet Take 50 mg by mouth daily.  04/08/15  Yes [provider]  Multiple Vitamins-Minerals (PRESERVISION AREDS 2) CAPS Take 1 capsule by mouth 2 (two) times daily.   Yes [provider]  Omega-3 Fatty Acids (FISH OIL) 1000 MG CAPS Take 1,000 capsules by mouth 2 (two) times daily.    Yes [provider]  warfarin (COUMADIN) 2 MG tablet Take 2-4 mg daily by mouth. Take 4 mg by mouth on Wednesday and take 2 mg by mouth on all other days. 02/08/15  Yes [provider]    Family History History reviewed. No pertinent family history.  Social History Social History   Tobacco Use  . Smoking status: Light Tobacco Smoker    Types: Cigars  . Smokeless tobacco: Never  Used  Substance Use Topics  . Alcohol use: No    Comment: April 21, 2005 last drink  . Drug use: No     Allergies   Shellfish allergy and Sulfa antibiotics   Review of Systems Review of Systems  Musculoskeletal: Positive for arthralgias and myalgias. Negative for joint swelling.  Skin: Negative for wound.  Neurological: Negative for dizziness, weakness, numbness and headaches.  All other systems reviewed and are negative.    Physical Exam Updated Vital Signs BP 135/75   Pulse 62   Temp 97.7 F (36.5 C) (Oral)   Resp 18   Ht 5\' 10"  (1.778 m)   Wt 82.6 kg (182 lb)   SpO2 98%   BMI 26.11 kg/m   Physical Exam  Constitutional: He is oriented to person,  place, and time. He appears well-developed and well-nourished.  HENT:  Head: Normocephalic and atraumatic.  Right Ear: External ear normal.  Left Ear: External ear normal.  Nose: Nose normal.  Eyes: Right eye exhibits no discharge. Left eye exhibits no discharge.  Neck: Neck supple.  Cardiovascular: Normal rate and regular rhythm.  Pulses:      Dorsalis pedis pulses are 2+ on the left side.  Pulmonary/Chest: Effort normal and breath sounds normal.  Musculoskeletal:       Left hip: He exhibits decreased range of motion (painful). He exhibits no tenderness.       Left knee: He exhibits no swelling. No tenderness found.       Left ankle: He exhibits normal range of motion and no swelling. No tenderness.       Left upper leg: He exhibits tenderness.       Left lower leg: He exhibits no tenderness.       Legs: Patient is unable to lift his left leg off bed with straight leg raise No palpable tenderness or deformity over left quadriceps tendon.  Neurological: He is alert and oriented to person, place, and time.  Skin: Skin is warm and dry.  Nursing note and vitals reviewed.    ED Treatments / Results  Labs (all labs ordered are listed, but only abnormal results are displayed) Labs Reviewed  BASIC METABOLIC PANEL - Abnormal; Notable for the following components:      Result Value   Sodium 132 (*)    Glucose, Bld 117 (*)    Calcium 8.8 (*)    All other components within normal limits  CBC WITH DIFFERENTIAL/PLATELET - Abnormal; Notable for the following components:   WBC 13.8 (*)    RBC 3.67 (*)    Hemoglobin 12.2 (*)    HCT 35.1 (*)    Neutro Abs 11.9 (*)    Lymphs Abs 0.5 (*)    Monocytes Absolute 1.3 (*)    All other components within normal limits  PROTIME-INR - Abnormal; Notable for the following components:   Prothrombin Time 24.7 (*)    All other components within normal limits  TYPE AND SCREEN  ABO/RH    EKG  EKG Interpretation  Date/Time:  Saturday September 28 2017 10:30:23 EST Ventricular Rate:  62 PR Interval:    QRS Duration: 126 QT Interval:  553 QTC Calculation: 562 R Axis:   -38 Text Interpretation:  Atrial flutter Ventricular premature complex Left bundle branch block Baseline wander in lead(s) III no significant change since 2016 Confirmed by Sherwood Gambler [phone]) on 09/28/2017 3:48:10 PM       Radiology Ct Head Wo Contrast  Result Date: 09/28/2017 CLINICAL  DATA:  Fall.  Patient denies head injury. EXAM: CT HEAD WITHOUT CONTRAST TECHNIQUE: Contiguous axial images were obtained from the base of the skull through the vertex without intravenous contrast. COMPARISON:  CT head dated March 09, 2015. FINDINGS: Brain: No evidence of acute infarction, hemorrhage, hydrocephalus, extra-axial collection or mass lesion/mass effect. Stable moderate cerebral atrophy and chronic microvascular ischemic white matter disease. Vascular: No hyperdense vessel or unexpected calcification. Skull: Negative for fracture or focal lesion. Sinuses/Orbits: No acute finding. Other: None. IMPRESSION: 1. No acute intracranial abnormality. Stable moderate cerebral atrophy and chronic microvascular ischemic white matter disease. Electronically Signed   By: Titus Dubin M.D.   On: 09/28/2017 15:34   Dg Hip Unilat W Or Wo Pelvis 2-3 Views Left  Result Date: 09/28/2017 CLINICAL DATA:  Fall.  Left hip pain. EXAM: DG HIP (WITH OR WITHOUT PELVIS) 2-3V LEFT COMPARISON:  None. FINDINGS: There is a left basicervical femoral neck fracture. No subluxation or dislocation. Mild degenerative changes in the hips bilaterally. SI joints are symmetric and unremarkable. IMPRESSION: Left basicervical femoral neck fracture. Electronically Signed   By: Rolm Baptise M.D.   On: 09/28/2017 12:24   Dg Femur Min 2 Views Left  Result Date: 09/28/2017 CLINICAL DATA:  Fall with left mid thigh pain.  Initial encounter. EXAM: LEFT FEMUR 2 VIEWS COMPARISON:  None. FINDINGS: There appears to be a  minimally displaced subcapital fracture of the proximal left femur. The uppermost images are not exactly centered over the hip and further evaluation with a dedicated hip series would be helpful. The rest of the femur appears intact without fracture or lesion. Extensive vascular calcifications are present. IMPRESSION: Probable subcapital hip fracture. However, the femur films are not well centered over the hip joint and it is recommended that additional correlation be performed with a left hip x-ray series. Electronically Signed   By: Aletta Edouard M.D.   On: 09/28/2017 11:17    Procedures Procedures (including critical care time)  Medications Ordered in ED Medications  phytonadione (VITAMIN K) 5 mg in dextrose 5 % 50 mL IVPB (5 mg Intravenous New Bag/Given 09/28/17 1546)  HYDROcodone-acetaminophen (NORCO/VICODIN) 5-325 MG per tablet 1 tablet (1 tablet Oral Given 09/28/17 1107)     Initial Impression / Assessment and Plan / ED Course  I have reviewed the triage vital signs and the nursing notes.  Pertinent labs & imaging results that were available during my care of the patient were reviewed by me and considered in my medical decision making (see chart for details).     Workup shows femoral neck fracture.  Patient's pain is controlled with oral hydrocodone.  Discussed with orthopedics, Dr. Doran Durand, who asks for patient's INR to be reversed with planned surgery tomorrow.  Family updated on plan.  Discussed with Dr. Maryland Pink, who will admit.  Given a dose of IV vitamin K.  Appears stable for floor admission.  Final Clinical Impressions(s) / ED Diagnoses   Final diagnoses:  Closed fracture of neck of left femur, initial encounter [facility])    ED Discharge Orders    None       Sherwood Gambler, MD 09/28/17 [phone]

## 2017-09-28 NOTE — Consult Note (Signed)
Reason for Consult:  Left hip injury Referring Physician:  Dr. Emiliano Dyer Dugo is an 83 y.o. male.  HPI: The patient is a 83 year old male with a past medical history significant for atrial fibrillation on Coumadin.  He was in his usual state of health earlier today when he got up from the table and fell.  He landed on his left side.  He was unable to get up.  His son and daughter-in-law came over and got him to the couch.  EMS brought him to the hospital.  He complains of aching pain in the hip and thigh.  Pain is worse with any attempted weightbearing.  He feels better when he is off of his foot and lying still.  He is not diabetic.  He does smoke cigars.  He resides at home with his wife.  He uses a cane to get around the house.  No previous history of injury or surgery to that hip in the past.  His INR is therapeutic.  Vitamin K has been administered.  Repeat PT/INR is pending for tomorrow morning.  Past Medical History:  Diagnosis Date  . A-fib ([place])   . Dysrhythmia    Atrial Tachycardia with block  . Erosive esophagitis   . GERD (gastroesophageal reflux disease)   . H/O fracture of rib    Left  5th, 6th, 7th had a fall in APRIL 2016  . Hypertension   . RA (rheumatoid arthritis) ([place])     Past Surgical History:  Procedure Laterality Date  . BALLOON DILATION N/A 05/04/2015   Performed by Wilford Corner, MD at [facility] ENDOSCOPY  . CATARACT EXTRACTION    . ESOPHAGOGASTRODUODENOSCOPY (EGD) N/A 05/04/2015   Performed by Wilford Corner, MD at [place]  . ESOPHAGOGASTRODUODENOSCOPY (EGD) N/A 03/12/2015   Performed by Wilford Corner, MD at [place]  . EYE SURGERY Bilateral 10/2015  . SAVORY DILATION N/A 05/04/2015   Performed by Wilford Corner, MD at [facility] ENDOSCOPY    Family History  Problem Relation Age of Onset  . Stroke Mother     Social History:  reports that he has been smoking cigars.  he has never used smokeless tobacco. He reports that he does not drink  alcohol or use drugs.  Allergies:  Allergies  Allergen Reactions  . Shellfish Allergy Anaphylaxis  . Sulfa Antibiotics Anaphylaxis    Medications: I have reviewed the patient's current medications.  Results for orders placed or performed during the hospital encounter of 09/28/17 (from the past 48 hour(s))  Basic metabolic panel     Status: Abnormal   Collection Time: 09/28/17  1:40 PM  Result Value Ref Range   Sodium 132 (L) 135 - 145 mmol/L   Potassium 4.2 3.5 - 5.1 mmol/L   Chloride 101 101 - 111 mmol/L   CO2 24 22 - 32 mmol/L   Glucose, Bld 117 (H) 65 - 99 mg/dL   BUN 17 6 - 20 mg/dL   Creatinine, Ser 1.01 0.61 - 1.24 mg/dL   Calcium 8.8 (L) 8.9 - 10.3 mg/dL   GFR calc non Af Amer >60 >60 mL/min   GFR calc Af Amer >60 >60 mL/min    Comment: (NOTE) The eGFR has been calculated using the CKD EPI equation. This calculation has not been validated in all clinical situations. eGFR's persistently <60 mL/min signify possible Chronic Kidney Disease.    Anion gap 7 5 - 15  CBC WITH DIFFERENTIAL     Status: Abnormal   Collection Time:  09/28/17  1:40 PM  Result Value Ref Range   WBC 13.8 (H) 4.0 - 10.5 K/uL   RBC 3.67 (L) 4.22 - 5.81 MIL/uL   Hemoglobin 12.2 (L) 13.0 - 17.0 g/dL   HCT 35.1 (L) 39.0 - 52.0 %   MCV 95.6 78.0 - 100.0 fL   MCH 33.2 26.0 - 34.0 pg   MCHC 34.8 30.0 - 36.0 g/dL   RDW 15.2 11.5 - 15.5 %   Platelets 194 150 - 400 K/uL   Neutrophils Relative % 86 %   Neutro Abs 11.9 (H) 1.7 - 7.7 K/uL   Lymphocytes Relative 4 %   Lymphs Abs 0.5 (L) 0.7 - 4.0 K/uL   Monocytes Relative 10 %   Monocytes Absolute 1.3 (H) 0.1 - 1.0 K/uL   Eosinophils Relative 0 %   Eosinophils Absolute 0.0 0.0 - 0.7 K/uL   Basophils Relative 0 %   Basophils Absolute 0.0 0.0 - 0.1 K/uL  Protime-INR     Status: Abnormal   Collection Time: 09/28/17  1:40 PM  Result Value Ref Range   Prothrombin Time 24.7 (H) 11.4 - 15.2 seconds   INR 2.26   ABO/Rh     Status: None   Collection Time:  09/28/17  1:40 PM  Result Value Ref Range   ABO/RH(D) O NEG   Type and screen [place]     Status: None   Collection Time: 09/28/17  1:57 PM  Result Value Ref Range   ABO/RH(D) O NEG    Antibody Screen NEG    Sample Expiration 10/01/2017     Ct Head Wo Contrast  Result Date: 09/28/2017 CLINICAL DATA:  Fall.  Patient denies head injury. EXAM: CT HEAD WITHOUT CONTRAST TECHNIQUE: Contiguous axial images were obtained from the base of the skull through the vertex without intravenous contrast. COMPARISON:  CT head dated March 09, 2015. FINDINGS: Brain: No evidence of acute infarction, hemorrhage, hydrocephalus, extra-axial collection or mass lesion/mass effect. Stable moderate cerebral atrophy and chronic microvascular ischemic white matter disease. Vascular: No hyperdense vessel or unexpected calcification. Skull: Negative for fracture or focal lesion. Sinuses/Orbits: No acute finding. Other: None. IMPRESSION: 1. No acute intracranial abnormality. Stable moderate cerebral atrophy and chronic microvascular ischemic white matter disease. Electronically Signed   By: Titus Dubin M.D.   On: 09/28/2017 15:34   Dg Hip Unilat W Or Wo Pelvis 2-3 Views Left  Result Date: 09/28/2017 CLINICAL DATA:  Fall.  Left hip pain. EXAM: DG HIP (WITH OR WITHOUT PELVIS) 2-3V LEFT COMPARISON:  None. FINDINGS: There is a left basicervical femoral neck fracture. No subluxation or dislocation. Mild degenerative changes in the hips bilaterally. SI joints are symmetric and unremarkable. IMPRESSION: Left basicervical femoral neck fracture. Electronically Signed   By: Rolm Baptise M.D.   On: 09/28/2017 12:24   Dg Femur Min 2 Views Left  Result Date: 09/28/2017 CLINICAL DATA:  Fall with left mid thigh pain.  Initial encounter. EXAM: LEFT FEMUR 2 VIEWS COMPARISON:  None. FINDINGS: There appears to be a minimally displaced subcapital fracture of the proximal left femur. The uppermost images are not  exactly centered over the hip and further evaluation with a dedicated hip series would be helpful. The rest of the femur appears intact without fracture or lesion. Extensive vascular calcifications are present. IMPRESSION: Probable subcapital hip fracture. However, the femur films are not well centered over the hip joint and it is recommended that additional correlation be performed with a left hip x-ray series.  Electronically Signed   By: Aletta Edouard M.D.   On: October 23, 2017 11:17    ROS: No recent fever, chills, nausea, vomiting, changes in his appetite, weight loss. PE:  Blood pressure 139/81, pulse 64, temperature 97.9 F (36.6 C), temperature source Oral, resp. rate 18, height 5' 10"  (1.778 m), weight 82.6 kg (182 lb), SpO2 100 %. Well-nourished well-developed elderly male in no apparent distress.  Mood and affect are normal.  Extraocular motions are intact.  Respirations are unlabored.  Left lower extremity is slightly shortened.  Skin is healthy and intact.  Pulses are palpable in the foot.  Since we will be light touch is intact in the foot.  5 out of 5 strength in plantar flexion dorsal flexion of the ankle and toes.  Assessment/Plan: Displaced left femoral neck fracture: I explained the nature of these findings to the patient and his family members in detail.  We talked about treatment options in detail as well as the relevant risks and benefits.  I have spoken with Dr. Alvan Dame who agrees with the plan for left hip hemiarthroplasty.  I posted the patient for surgery tomorrow.  N.p.o. after midnight.  Hold blood thinners.  Recheck PT/INR in the morning.  Patient and his family understand this plan and agree.  The risks and benefits of the alternative treatment options have been discussed in detail.  The patient wishes to proceed with surgery and specifically understands risks of bleeding, infection, nerve damage, blood clots, need for additional surgery, amputation and death.   Wylene Simmer October 23, 2017, 5:49 PM

## 2017-09-28 NOTE — Progress Notes (Signed)
Patient's B/P was 170/89. The PCP on call was notified.

## 2017-09-28 NOTE — H&P (Signed)
[facility] History and Physical  [name] URK:[id number] DOB: [birth year] DOA: 09/28/2017   PCP: Merrilee Seashore, MD  Specialists: Dr. Wynonia Lawman is his cardiologist.  Dr. Amil Amen is his rheumatologist.  Chief Complaint: Left leg pain  HPI: [name] is a 83 y.o. male with a past medical history of atrial fibrillation on anticoagulation with warfarin who was in his usual state of health until this morning when he woke up, and had breakfast.  He was sitting at his dining table when he twisted and then fell.  Scraped his head against the cabinet.  He fell on his left side and experienced pain in the left leg.  He did not have any dizziness lightheadedness.  No passing out episodes.  No chest pain shortness of breath.  He was brought into the hospital and was diagnosed with a left hip fracture.  He typically uses a cane to ambulate at home.  Is able to walk to his mailbox and back without any problem.  He is able to climb a few steps.  Does not experience any dyspnea.  He currently denies any headaches.  In the emergency department he was found to have a left hip fracture.  Orthopedics was consulted.  Plan is for surgical intervention tomorrow.  Home Medications: Prior to Admission medications   Medication Sig Start Date End Date Taking? Authorizing Provider  cephALEXin (KEFLEX) 500 MG capsule Take 500 mg 3 (three) times daily by mouth. 09/26/17  Yes [provider]  diltiazem (TIAZAC) 360 MG 24 hr capsule Take 360 mg by mouth daily. 02/20/15  Yes [provider]  metoprolol succinate (TOPROL-XL) 50 MG 24 hr tablet Take 50 mg by mouth daily.  04/08/15  Yes [provider]  Multiple Vitamins-Minerals (PRESERVISION AREDS 2) CAPS Take 1 capsule by mouth 2 (two) times daily.   Yes [provider]  Omega-3 Fatty Acids (FISH OIL) 1000 MG CAPS Take 1,000 capsules by mouth 2 (two) times daily.    Yes [provider]  warfarin (COUMADIN) 2 MG  tablet Take 2-4 mg daily by mouth. Take 4 mg by mouth on Wednesday and take 2 mg by mouth on all other days. 02/08/15  Yes [provider]    Allergies:  Allergies  Allergen Reactions  . Shellfish Allergy Anaphylaxis  . Sulfa Antibiotics Anaphylaxis    Past Medical History: Past Medical History:  Diagnosis Date  . A-fib ([place])   . Dysrhythmia    Atrial Tachycardia with block  . Erosive esophagitis   . GERD (gastroesophageal reflux disease)   . H/O fracture of rib    Left  5th, 6th, 7th had a fall in APRIL 2016  . Hypertension   . RA (rheumatoid arthritis) ([place])     Past Surgical History:  Procedure Laterality Date  . BALLOON DILATION N/A 05/04/2015   Performed by Wilford Corner, MD at [facility] ENDOSCOPY  . CATARACT EXTRACTION    . ESOPHAGOGASTRODUODENOSCOPY (EGD) N/A 05/04/2015   Performed by Wilford Corner, MD at [place]  . ESOPHAGOGASTRODUODENOSCOPY (EGD) N/A 03/12/2015   Performed by Wilford Corner, MD at [place]  . EYE SURGERY Bilateral 10/2015  . SAVORY DILATION N/A 05/04/2015   Performed by Wilford Corner, MD at [facility] ENDOSCOPY    Social History: Lives in [place] with his spouse.  Smokes 2-5 cigars almost on a daily basis.  No alcohol use.  Activity level as discussed earlier.   Family History:  Family History  Problem Relation Age of Onset  .  Stroke Mother      Review of Systems - History obtained from the patient General ROS: negative Psychological ROS: negative Ophthalmic ROS: negative ENT ROS: negative Allergy and Immunology ROS: negative Hematological and Lymphatic ROS: negative Endocrine ROS: negative Respiratory ROS: no cough, shortness of breath, or wheezing Cardiovascular ROS: no chest pain or dyspnea on exertion Gastrointestinal ROS: no abdominal pain, change in bowel habits, or black or bloody stools Genito-Urinary ROS: no dysuria, trouble voiding, or hematuria Musculoskeletal ROS: left leg pain Neurological ROS: no TIA  or stroke symptoms Dermatological ROS: negative  Physical Examination  Vitals:   09/28/17 1015 09/28/17 1021 09/28/17 1022  BP:  135/75   Pulse:  62   Resp:  18   Temp:  97.7 F (36.5 C)   TempSrc:  Oral   SpO2: 100% 98%   Weight:   82.6 kg (182 lb)  Height:   5\' 10"  (1.778 m)    BP 135/75   Pulse 62   Temp 97.7 F (36.5 C) (Oral)   Resp 18   Ht 5\' 10"  (1.778 m)   Wt 82.6 kg (182 lb)   SpO2 98%   BMI 26.11 kg/m   General appearance: alert, cooperative, appears stated age and no distress Head: Normocephalic, without obvious abnormality, atraumatic Eyes: conjunctivae/corneas clear. PERRL, EOM's intact.  Throat: lips, mucosa, and tongue normal; teeth and gums normal Neck: no adenopathy, no carotid bruit, no JVD, supple, symmetrical, trachea midline and thyroid not enlarged, symmetric, no tenderness/mass/nodules Resp: clear to auscultation bilaterally Cardio: S1-S2 is irregularly irregular.  No S3-S4.  No rubs murmurs or bruit GI: soft, non-tender; bowel sounds normal; no masses,  no organomegaly Extremities: Left lower extremity is externally rotated.  No obvious swelling noted over the thigh Pulses: 2+ and symmetric Skin: Skin color, texture, turgor normal. No rashes or lesions Lymph nodes: Cervical, supraclavicular, and axillary nodes normal. Neurologic: No focal deficits   Labs on Admission: I have personally reviewed following labs and imaging studies  CBC: Recent Labs  Lab 09/28/17 1340  WBC 13.8*  NEUTROABS 11.9*  HGB 12.2*  HCT 35.1*  MCV 95.6  PLT 175   Basic Metabolic Panel: Recent Labs  Lab 09/28/17 1340  NA 132*  K 4.2  CL 101  CO2 24  GLUCOSE 117*  BUN 17  CREATININE 1.01  CALCIUM 8.8*   GFR: Estimated Creatinine Clearance: 48.2 mL/min (by C-G formula based on SCr of 1.01 mg/dL).  Coagulation Profile: Recent Labs  Lab 09/28/17 1340  INR 2.26     Radiological Exams on Admission: Ct Head Wo Contrast  Result Date:  09/28/2017 CLINICAL DATA:  Fall.  Patient denies head injury. EXAM: CT HEAD WITHOUT CONTRAST TECHNIQUE: Contiguous axial images were obtained from the base of the skull through the vertex without intravenous contrast. COMPARISON:  CT head dated March 09, 2015. FINDINGS: Brain: No evidence of acute infarction, hemorrhage, hydrocephalus, extra-axial collection or mass lesion/mass effect. Stable moderate cerebral atrophy and chronic microvascular ischemic white matter disease. Vascular: No hyperdense vessel or unexpected calcification. Skull: Negative for fracture or focal lesion. Sinuses/Orbits: No acute finding. Other: None. IMPRESSION: 1. No acute intracranial abnormality. Stable moderate cerebral atrophy and chronic microvascular ischemic white matter disease. Electronically Signed   By: Titus Dubin M.D.   On: 09/28/2017 15:34   Dg Hip Unilat W Or Wo Pelvis 2-3 Views Left  Result Date: 09/28/2017 CLINICAL DATA:  Fall.  Left hip pain. EXAM: DG HIP (WITH OR WITHOUT PELVIS) 2-3V LEFT COMPARISON:  None. FINDINGS: There is a left basicervical femoral neck fracture. No subluxation or dislocation. Mild degenerative changes in the hips bilaterally. SI joints are symmetric and unremarkable. IMPRESSION: Left basicervical femoral neck fracture. Electronically Signed   By: Rolm Baptise M.D.   On: 09/28/2017 12:24   Dg Femur Min 2 Views Left  Result Date: 09/28/2017 CLINICAL DATA:  Fall with left mid thigh pain.  Initial encounter. EXAM: LEFT FEMUR 2 VIEWS COMPARISON:  None. FINDINGS: There appears to be a minimally displaced subcapital fracture of the proximal left femur. The uppermost images are not exactly centered over the hip and further evaluation with a dedicated hip series would be helpful. The rest of the femur appears intact without fracture or lesion. Extensive vascular calcifications are present. IMPRESSION: Probable subcapital hip fracture. However, the femur films are not well centered over the hip  joint and it is recommended that additional correlation be performed with a left hip x-ray series. Electronically Signed   By: Aletta Edouard M.D.   On: 09/28/2017 11:17    My interpretation of Electrocardiogram: Reveals atrial flutter/fibrillation in the 60s.    Problem List  Active Problems:   A-fib ([place])   Closed left hip fracture (HCC)   Assessment: This is a 83 year old Caucasian male with a past medical history as stated earlier who presents after a mechanical fall resulting in left hip fracture.  Plan: #1 left hip fracture/preoperative evaluation: Orthopedics to see the patient.  Pain control.  Hip fracture order set has been initiated.  Preoperative evaluation was done.  Patient does have a history of atrial fibrillation/flutter. Based on the [place] Perioperative Risk Index the patient's estimated risk probability for perioperative myocardial infarction or cardiac arrest is 1.97%. Patient's procedure is intermediate risk. Patient's functional capacity is low to moderate. Based on the AHA/ACC algorithms patient may proceed to surgery without further testing.  He is on beta-blockers at home which will be continued.  Incentive spirometry post operatively.  #2 chronic atrial fibrillation/flutter: Followed by Dr. Wynonia Lawman. Rate is stable.  He is on a beta-blocker as well as calcium channel blocker both of which will be continued.  He is also on anticoagulation with warfarin.  INR is therapeutic.  No history of stroke.  For the surgery his INR will need to reverse.  He has been given vitamin K.  Check PT/INR tomorrow morning.  Beta-blocker to be continued along with calcium channel blocker.  Heart rate to be monitored closely during the perioperative.   #3 mild hyponatremia: Repeat labs tomorrow.  DVT Prophylaxis: On warfarin.  This can be resumed postoperatively per orthopedics. Code Status: Discussed with the patient as well as his family.  He is DNR. Family Communication: Discussed with  patient and his wife and his son Consults called: Orthopedic  Severity of Illness: The appropriate patient status for this patient is INPATIENT. Inpatient status is judged to be reasonable and necessary in order to provide the required intensity of service to ensure the patient's safety. The patient's presenting symptoms, physical exam findings, and initial radiographic and laboratory data in the context of their chronic comorbidities is felt to place them at high risk for further clinical deterioration. Furthermore, it is not anticipated that the patient will be medically stable for discharge from the hospital within 2 midnights of admission. The following factors support the patient status of inpatient.   " The patient's presenting symptoms include pain in the left leg. " The worrisome physical exam findings include externally rotated left leg. "  The initial radiographic and laboratory data are worrisome because of left hip fracture. " The chronic co-morbidities include atrial fibrillation.   * I certify that at the point of admission it is my clinical judgment that the patient will require inpatient hospital care spanning beyond 2 midnights from the point of admission due to high intensity of service, high risk for further deterioration and high frequency of surveillance required.*  Further management decisions will depend on results of further testing and patient's response to treatment.   Bonnielee Haff  [facility] Pager [phone]  If 7PM-7AM, please contact night-coverage [email or web address] Password [email or web address]  09/28/2017, 4:30 PM

## 2017-09-28 NOTE — ED Notes (Signed)
Bed: WA20 Expected date: 09/28/17 Expected time: 9:59 AM Means of arrival: Ambulance Comments: Fall, hip pain, on blood thinners

## 2017-09-28 NOTE — ED Triage Notes (Signed)
Per EMS patient from home-walking to bathroom and twisted the wrong way, falling onto left side.  Patient c/o left thigh and left knee pain.  Denies hip pain at present.  Currently takes coumadin but denies head injury, LOC, neck, back pain.  Unable to bear weight on left leg.  Per family patient's left leg is always externally rotated and is the patient's baseline.

## 2017-09-29 ENCOUNTER — Inpatient Hospital Stay (HOSPITAL_COMMUNITY): Payer: Medicare Other | Admitting: Certified Registered Nurse Anesthetist

## 2017-09-29 ENCOUNTER — Encounter (HOSPITAL_COMMUNITY): Payer: Self-pay | Admitting: Certified Registered Nurse Anesthetist

## 2017-09-29 ENCOUNTER — Inpatient Hospital Stay (HOSPITAL_COMMUNITY): Payer: Medicare Other

## 2017-09-29 ENCOUNTER — Encounter (HOSPITAL_COMMUNITY)
Admission: EM | Disposition: A | Discharge status: Skilled Nursing Facility | Payer: Self-pay | Source: Home / Self Care | Attending: Internal Medicine

## 2017-09-29 DIAGNOSIS — E871 Hypo-osmolality and hyponatremia: Secondary | ICD-10-CM

## 2017-09-29 HISTORY — PX: HIP ARTHROPLASTY: SHX981

## 2017-09-29 LAB — BASIC METABOLIC PANEL
ANION GAP: 5 (ref 5–15)
BUN: 16 mg/dL (ref 6–20)
CHLORIDE: 102 mmol/L (ref 101–111)
CO2: 24 mmol/L (ref 22–32)
Calcium: 8.4 mg/dL — ABNORMAL LOW (ref 8.9–10.3)
Creatinine, Ser: 1.04 mg/dL (ref 0.61–1.24)
GFR calc non Af Amer: >60 mL/min (ref >60)
GLUCOSE: 121 mg/dL — AB (ref 65–99)
Potassium: 4.7 mmol/L (ref 3.5–5.1)
Sodium: 131 mmol/L — ABNORMAL LOW (ref 135–145)

## 2017-09-29 LAB — CBC
HEMATOCRIT: 34.4 % — AB (ref 39.0–52.0)
HEMOGLOBIN: 11.8 g/dL — AB (ref 13.0–17.0)
MCH: 33.1 pg (ref 26.0–34.0)
MCHC: 34.3 g/dL (ref 30.0–36.0)
MCV: 96.6 fL (ref 78.0–100.0)
Platelets: 180 10*3/uL (ref 150–400)
RBC: 3.56 MIL/uL — ABNORMAL LOW (ref 4.22–5.81)
RDW: 15.2 % (ref 11.5–15.5)
WBC: 7.8 10*3/uL (ref 4.0–10.5)

## 2017-09-29 LAB — PROTIME-INR
INR: 1.34
Prothrombin Time: 16.5 seconds — ABNORMAL HIGH (ref 11.4–15.2)

## 2017-09-29 IMAGING — DX DG PORTABLE PELVIS
1 series · 1 of 1 positions shown · non-contrast
Comparison: [DATE]

CLINICAL DATA: Status post left hip replacement.

EXAM:
PORTABLE PELVIS 1-2 VIEWS

[pelvis ap]
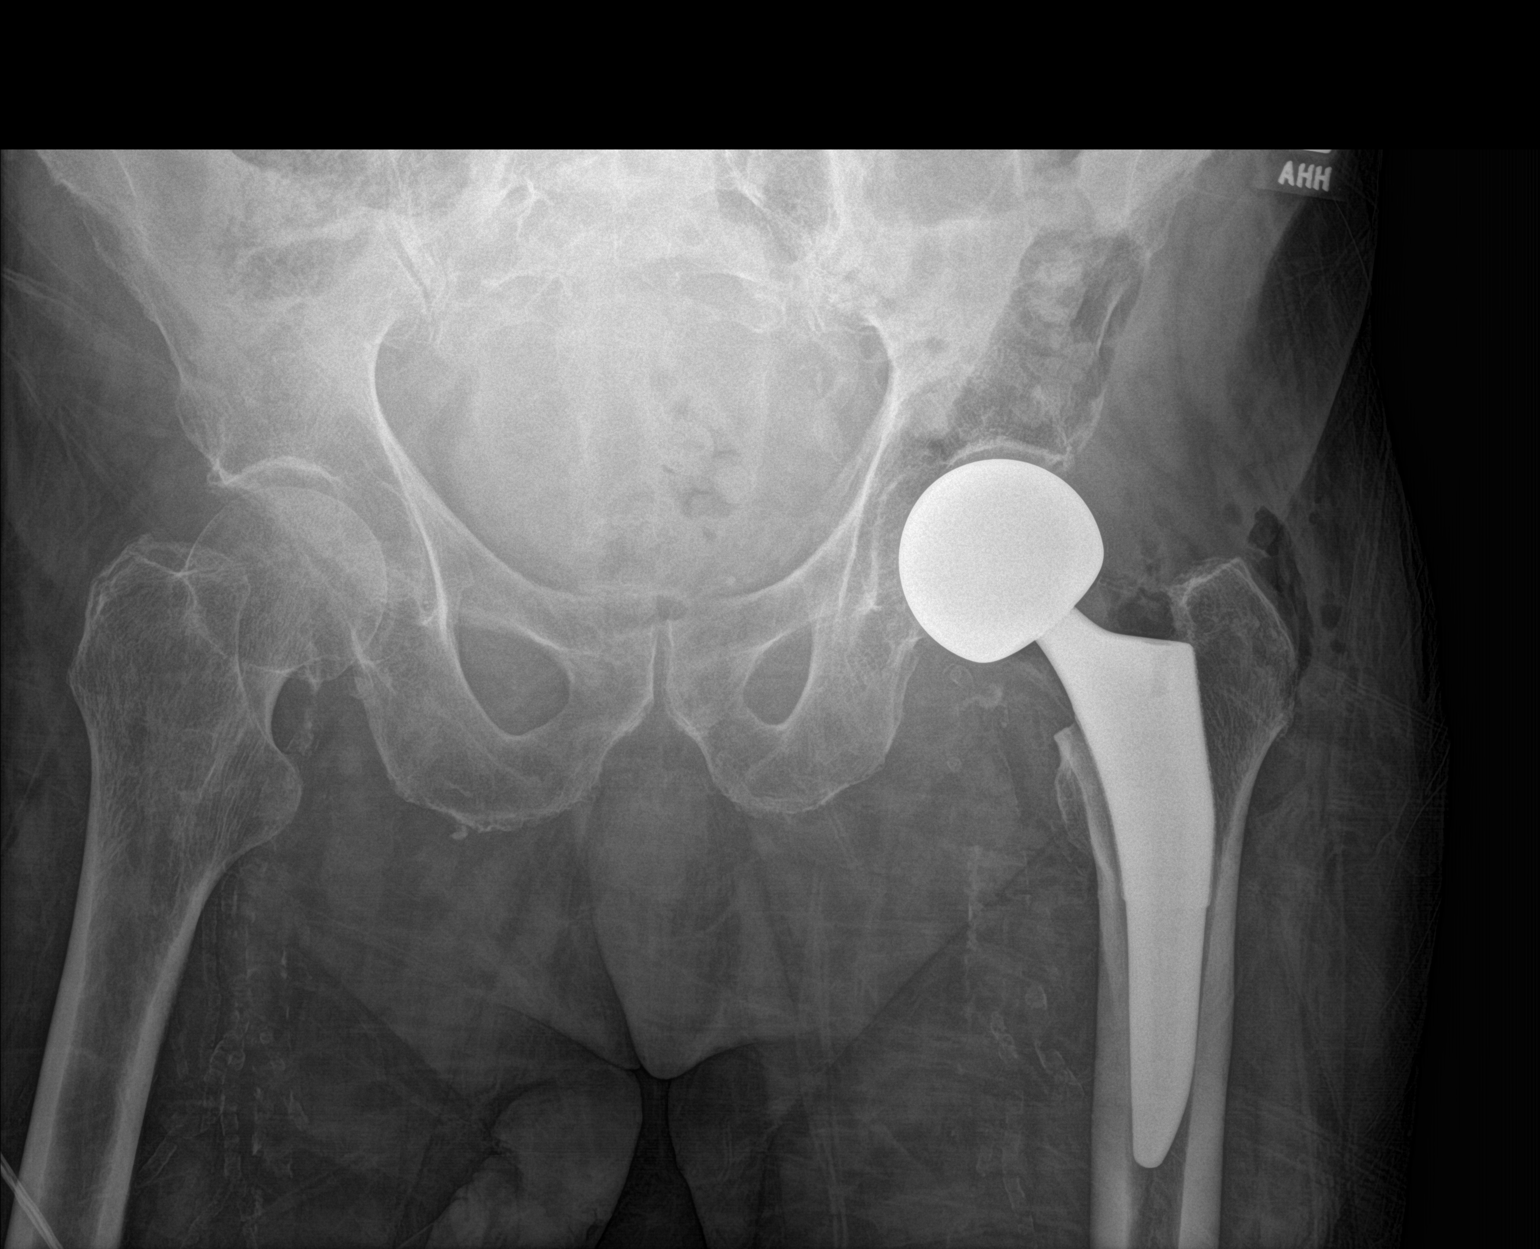

[1 of 1 positions shown; findings below may reference images not displayed]

FINDINGS: Interval left hip hemiarthroplasty. No evidence for immediate
hardware complication. Gas in the soft tissues compatible with the
recent surgery. Bones diffusely demineralized.
IMPRESSION: Status post left hip hemiarthroplasty without evidence for immediate
hardware complications.

## 2017-09-29 SURGERY — HEMIARTHROPLASTY, HIP, DIRECT ANTERIOR APPROACH, FOR FRACTURE
Anesthesia: General | Site: Hip | Laterality: Left

## 2017-09-29 MED ORDER — 0.9 % SODIUM CHLORIDE (POUR BTL) OPTIME
TOPICAL | Status: DC | PRN
  Administered 2017-09-29: 1000 mL

## 2017-09-29 MED ORDER — MEPERIDINE HCL 50 MG/ML IJ SOLN: 6.2500 mg | INTRAMUSCULAR | Status: DC | PRN

## 2017-09-29 MED ORDER — ROCURONIUM BROMIDE 50 MG/5ML IV SOSY
PREFILLED_SYRINGE | INTRAVENOUS | Status: AC
  Filled 2017-09-29: qty 5

## 2017-09-29 MED ORDER — FERROUS SULFATE 325 (65 FE) MG PO TABS
325.0000 mg | ORAL_TABLET | Freq: Three times a day (TID) | ORAL | Status: DC
  Administered 2017-09-29 – 2017-09-30 (×4): 325 mg via ORAL
  Filled 2017-09-29 (×7): qty 1

## 2017-09-29 MED ORDER — ONDANSETRON HCL 4 MG/2ML IJ SOLN
INTRAMUSCULAR | Status: AC
  Filled 2017-09-29: qty 2

## 2017-09-29 MED ORDER — LIDOCAINE 2% (20 MG/ML) 5 ML SYRINGE
INTRAMUSCULAR | Status: AC
  Filled 2017-09-29: qty 5

## 2017-09-29 MED ORDER — EPHEDRINE SULFATE-NACL 50-0.9 MG/10ML-% IV SOSY
PREFILLED_SYRINGE | INTRAVENOUS | Status: DC | PRN
  Administered 2017-09-29: 5 mg via INTRAVENOUS

## 2017-09-29 MED ORDER — BISACODYL 10 MG RE SUPP: 10.0000 mg | Freq: Every day | RECTAL | Status: DC | PRN

## 2017-09-29 MED ORDER — ONDANSETRON HCL 4 MG/2ML IJ SOLN: 4.0000 mg | Freq: Once | INTRAMUSCULAR | Status: DC | PRN

## 2017-09-29 MED ORDER — ROCURONIUM BROMIDE 10 MG/ML (PF) SYRINGE
PREFILLED_SYRINGE | INTRAVENOUS | Status: DC | PRN
  Administered 2017-09-29: 40 mg via INTRAVENOUS

## 2017-09-29 MED ORDER — ONDANSETRON HCL 4 MG/2ML IJ SOLN
INTRAMUSCULAR | Status: DC | PRN
  Administered 2017-09-29: 4 mg via INTRAVENOUS

## 2017-09-29 MED ORDER — DEXAMETHASONE SODIUM PHOSPHATE 10 MG/ML IJ SOLN
INTRAMUSCULAR | Status: DC | PRN
  Administered 2017-09-29: 10 mg via INTRAVENOUS

## 2017-09-29 MED ORDER — PHENOL 1.4 % MT LIQD: 1.0000 | OROMUCOSAL | Status: DC | PRN

## 2017-09-29 MED ORDER — HYDROMORPHONE HCL 1 MG/ML IJ SOLN: 0.2500 mg | INTRAMUSCULAR | Status: DC | PRN

## 2017-09-29 MED ORDER — PHENYLEPHRINE 40 MCG/ML (10ML) SYRINGE FOR IV PUSH (FOR BLOOD PRESSURE SUPPORT)
PREFILLED_SYRINGE | INTRAVENOUS | Status: DC | PRN
  Administered 2017-09-29 (×4): 80 ug via INTRAVENOUS

## 2017-09-29 MED ORDER — LIDOCAINE 2% (20 MG/ML) 5 ML SYRINGE
INTRAMUSCULAR | Status: DC | PRN
  Administered 2017-09-29: 80 mg via INTRAVENOUS

## 2017-09-29 MED ORDER — FENTANYL CITRATE (PF) 100 MCG/2ML IJ SOLN
INTRAMUSCULAR | Status: AC
  Filled 2017-09-29: qty 2

## 2017-09-29 MED ORDER — PROPOFOL 10 MG/ML IV BOLUS
INTRAVENOUS | Status: DC | PRN
  Administered 2017-09-29: 50 mg via INTRAVENOUS
  Administered 2017-09-29: 100 mg via INTRAVENOUS

## 2017-09-29 MED ORDER — POLYETHYLENE GLYCOL 3350 17 G PO PACK: 17.0000 g | PACK | Freq: Every day | ORAL | Status: DC | PRN

## 2017-09-29 MED ORDER — STERILE WATER FOR IRRIGATION IR SOLN
Status: DC | PRN
  Administered 2017-09-29: 2000 mL

## 2017-09-29 MED ORDER — SUGAMMADEX SODIUM 200 MG/2ML IV SOLN
INTRAVENOUS | Status: AC
  Filled 2017-09-29: qty 2

## 2017-09-29 MED ORDER — METOCLOPRAMIDE HCL 5 MG PO TABS: 5.0000 mg | ORAL_TABLET | Freq: Three times a day (TID) | ORAL | Status: DC | PRN

## 2017-09-29 MED ORDER — ONDANSETRON HCL 4 MG PO TABS: 4.0000 mg | ORAL_TABLET | Freq: Four times a day (QID) | ORAL | Status: DC | PRN

## 2017-09-29 MED ORDER — WARFARIN - PHARMACIST DOSING INPATIENT: Freq: Every day | Status: DC

## 2017-09-29 MED ORDER — CEFAZOLIN SODIUM-DEXTROSE 2-4 GM/100ML-% IV SOLN
2.0000 g | Freq: Four times a day (QID) | INTRAVENOUS | Status: AC
  Administered 2017-09-29 – 2017-09-30 (×2): 2 g via INTRAVENOUS
  Filled 2017-09-29 (×2): qty 100

## 2017-09-29 MED ORDER — ENOXAPARIN SODIUM 40 MG/0.4ML ~~LOC~~ SOLN
40.0000 mg | SUBCUTANEOUS | Status: DC
  Administered 2017-09-30: 40 mg via SUBCUTANEOUS
  Filled 2017-09-29: qty 0.4

## 2017-09-29 MED ORDER — SODIUM CHLORIDE 0.9 % IV SOLN
1000.0000 mg | INTRAVENOUS | Status: AC
  Administered 2017-09-29: 1000 mg via INTRAVENOUS
  Filled 2017-09-29: qty 1100

## 2017-09-29 MED ORDER — MENTHOL 3 MG MT LOZG
1.0000 | LOZENGE | OROMUCOSAL | Status: DC | PRN
  Filled 2017-09-29: qty 9

## 2017-09-29 MED ORDER — DEXAMETHASONE SODIUM PHOSPHATE 10 MG/ML IJ SOLN
INTRAMUSCULAR | Status: AC
  Filled 2017-09-29: qty 1

## 2017-09-29 MED ORDER — ONDANSETRON HCL 4 MG/2ML IJ SOLN
4.0000 mg | Freq: Four times a day (QID) | INTRAMUSCULAR | Status: DC | PRN
  Administered 2017-09-30 – 2017-10-01 (×2): 4 mg via INTRAVENOUS
  Filled 2017-09-29 (×3): qty 2

## 2017-09-29 MED ORDER — WARFARIN SODIUM 4 MG PO TABS
4.0000 mg | ORAL_TABLET | Freq: Once | ORAL | Status: AC
  Administered 2017-09-29: 4 mg via ORAL
  Filled 2017-09-29: qty 1

## 2017-09-29 MED ORDER — METOCLOPRAMIDE HCL 5 MG/ML IJ SOLN
5.0000 mg | Freq: Three times a day (TID) | INTRAMUSCULAR | Status: DC | PRN
Start: 2017-09-29 — End: 2017-10-04

## 2017-09-29 MED ORDER — PROPOFOL 10 MG/ML IV BOLUS
INTRAVENOUS | Status: AC
Start: 2017-09-29 — End: 2017-09-29
  Filled 2017-09-29: qty 20

## 2017-09-29 MED ORDER — EPHEDRINE 5 MG/ML INJ
INTRAVENOUS | Status: AC
  Filled 2017-09-29: qty 10

## 2017-09-29 MED ORDER — CEFAZOLIN SODIUM-DEXTROSE 2-4 GM/100ML-% IV SOLN
INTRAVENOUS | Status: AC
  Filled 2017-09-29: qty 100

## 2017-09-29 MED ORDER — FENTANYL CITRATE (PF) 100 MCG/2ML IJ SOLN
INTRAMUSCULAR | Status: DC | PRN
  Administered 2017-09-29: 100 ug via INTRAVENOUS
  Administered 2017-09-29 (×3): 50 ug via INTRAVENOUS

## 2017-09-29 MED ORDER — MAGNESIUM CITRATE PO SOLN: 1.0000 | Freq: Once | ORAL | Status: DC | PRN

## 2017-09-29 MED ORDER — PHENYLEPHRINE 40 MCG/ML (10ML) SYRINGE FOR IV PUSH (FOR BLOOD PRESSURE SUPPORT)
PREFILLED_SYRINGE | INTRAVENOUS | Status: AC
Start: 2017-09-29 — End: 2017-09-29
  Filled 2017-09-29: qty 10

## 2017-09-29 SURGICAL SUPPLY — 52 items
ADH SKN CLS APL DERMABOND .7 (GAUZE/BANDAGES/DRESSINGS) ×1
BAG SPEC THK2 15X12 ZIP CLS (MISCELLANEOUS)
BAG ZIPLOCK 12X15 (MISCELLANEOUS) IMPLANT
BLADE SAW SGTL 11.0X1.19X90.0M (BLADE) IMPLANT
BLADE SAW SGTL 18X1.27X75 (BLADE) ×2 IMPLANT
BLADE SAW SGTL 18X1.27X75MM (BLADE) ×1
CAPT HIP HEMI 2 ×3 IMPLANT
CLOSURE WOUND 1/2 X4 (GAUZE/BANDAGES/DRESSINGS) ×2
COVER SURGICAL LIGHT HANDLE (MISCELLANEOUS) ×3 IMPLANT
DERMABOND ADVANCED (GAUZE/BANDAGES/DRESSINGS) ×2
DERMABOND ADVANCED .7 DNX12 (GAUZE/BANDAGES/DRESSINGS) ×1 IMPLANT
DRAPE ORTHO SPLIT 77X108 STRL (DRAPES) ×6
DRAPE POUCH INSTRU U-SHP 10X18 (DRAPES) ×3 IMPLANT
DRAPE SURG 17X11 SM STRL (DRAPES) ×3 IMPLANT
DRAPE SURG ORHT 6 SPLT 77X108 (DRAPES) ×2 IMPLANT
DRAPE U-SHAPE 47X51 STRL (DRAPES) ×3 IMPLANT
DRSG AQUACEL AG ADV 3.5X10 (GAUZE/BANDAGES/DRESSINGS) ×3 IMPLANT
DRSG TEGADERM 4X4.75 (GAUZE/BANDAGES/DRESSINGS) ×3 IMPLANT
DURAPREP 26ML APPLICATOR (WOUND CARE) ×3 IMPLANT
ELECT BLADE TIP CTD 4 INCH (ELECTRODE) ×3 IMPLANT
ELECT REM PT RETURN 15FT ADLT (MISCELLANEOUS) ×3 IMPLANT
EVACUATOR 1/8 PVC DRAIN (DRAIN) IMPLANT
FACESHIELD WRAPAROUND (MASK) ×12 IMPLANT
GAUZE SPONGE 2X2 8PLY STRL LF (GAUZE/BANDAGES/DRESSINGS) IMPLANT
GLOVE BIOGEL M 7.0 STRL (GLOVE) IMPLANT
GLOVE BIOGEL PI IND STRL 7.5 (GLOVE) ×1 IMPLANT
GLOVE BIOGEL PI IND STRL 8.5 (GLOVE) ×1 IMPLANT
GLOVE BIOGEL PI INDICATOR 7.5 (GLOVE) ×2
GLOVE BIOGEL PI INDICATOR 8.5 (GLOVE) ×2
GLOVE ECLIPSE 8.0 STRL XLNG CF (GLOVE) ×3 IMPLANT
GLOVE ORTHO TXT STRL SZ7.5 (GLOVE) ×6 IMPLANT
GLOVE SURG ORTHO 8.0 STRL STRW (GLOVE) IMPLANT
GOWN STRL REUS W/TWL LRG LVL3 (GOWN DISPOSABLE) IMPLANT
GOWN STRL REUS W/TWL XL LVL3 (GOWN DISPOSABLE) ×3 IMPLANT
HANDPIECE INTERPULSE COAX TIP (DISPOSABLE)
IMMOBILIZER KNEE 20 (SOFTGOODS)
IMMOBILIZER KNEE 20 THIGH 36 (SOFTGOODS) IMPLANT
KIT BASIN OR (CUSTOM PROCEDURE TRAY) ×3 IMPLANT
MANIFOLD NEPTUNE II (INSTRUMENTS) ×3 IMPLANT
PACK TOTAL JOINT (CUSTOM PROCEDURE TRAY) ×3 IMPLANT
POSITIONER SURGICAL ARM (MISCELLANEOUS) ×3 IMPLANT
SET HNDPC FAN SPRY TIP SCT (DISPOSABLE) IMPLANT
SPONGE GAUZE 2X2 STER 10/PKG (GAUZE/BANDAGES/DRESSINGS)
STRIP CLOSURE SKIN 1/2X4 (GAUZE/BANDAGES/DRESSINGS) ×4 IMPLANT
SUT ETHIBOND NAB CT1 #1 30IN (SUTURE) ×3 IMPLANT
SUT MNCRL AB 4-0 PS2 18 (SUTURE) ×3 IMPLANT
SUT VIC AB 1 CT1 36 (SUTURE) ×6 IMPLANT
SUT VIC AB 2-0 CT1 27 (SUTURE) ×6
SUT VIC AB 2-0 CT1 TAPERPNT 27 (SUTURE) ×2 IMPLANT
SUT VLOC 180 0 24IN GS25 (SUTURE) IMPLANT
TOWEL OR 17X26 10 PK STRL BLUE (TOWEL DISPOSABLE) ×3 IMPLANT
TRAY FOLEY W/METER SILVER 16FR (SET/KITS/TRAYS/PACK) IMPLANT

## 2017-09-29 NOTE — Anesthesia Preprocedure Evaluation (Addendum)
Anesthesia Evaluation  Patient identified by MRN, date of birth, ID band Patient awake    Reviewed: Allergy & Precautions, NPO status , Patient's Chart, lab work & pertinent test results  Airway Mallampati: I  TM Distance: >3 FB Neck ROM: Full    Dental   Pulmonary Current Smoker,    Pulmonary exam normal        Cardiovascular hypertension, Pt. on medications Normal cardiovascular exam     Neuro/Psych    GI/Hepatic GERD  Medicated and Controlled,  Endo/Other    Renal/GU      Musculoskeletal   Abdominal   Peds  Hematology   Anesthesia Other Findings   Reproductive/Obstetrics                             Anesthesia Physical Anesthesia Plan  ASA: III  Anesthesia Plan: General   Post-op Pain Management:    Induction: Intravenous  PONV Risk Score and Plan: 1 and Ondansetron  Airway Management Planned: Oral ETT  Additional Equipment:   Intra-op Plan:   Post-operative Plan: Extubation in OR  Informed Consent: I have reviewed the patients History and Physical, chart, labs and discussed the procedure including the risks, benefits and alternatives for the proposed anesthesia with the patient or authorized representative who has indicated his/her understanding and acceptance.     Plan Discussed with: CRNA and Surgeon  Anesthesia Plan Comments:        Anesthesia Quick Evaluation

## 2017-09-29 NOTE — Anesthesia Procedure Notes (Signed)
Procedure Name: Intubation Date/Time: 09/29/2017 1:29 PM Performed by: Claudia Desanctis, CRNA Pre-anesthesia Checklist: Patient identified, Emergency Drugs available, Suction available and Patient being monitored Patient Re-evaluated:Patient Re-evaluated prior to induction Oxygen Delivery Method: Circle system utilized Preoxygenation: Pre-oxygenation with 100% oxygen Induction Type: IV induction Ventilation: Mask ventilation without difficulty Laryngoscope Size: 2 and Miller Grade View: Grade I Tube type: Oral Number of attempts: 1 Airway Equipment and Method: Stylet Placement Confirmation: ETT inserted through vocal cords under direct vision,  positive ETCO2 and breath sounds checked- equal and bilateral Secured at: 22 cm Tube secured with: Tape Dental Injury: Teeth and Oropharynx as per pre-operative assessment

## 2017-09-29 NOTE — Transfer of Care (Signed)
Immediate Anesthesia Transfer of Care Note  Patient: [name]  Procedure(s) Performed: ARTHROPLASTY  HIP HEMIARTHROPLASTY left (Left Hip)  Patient Location: PACU  Anesthesia Type:General  Level of Consciousness: drowsy  Airway & Oxygen Therapy: Patient Spontanous Breathing and Patient connected to face mask  Post-op Assessment: Report given to RN and Post -op Vital signs reviewed and stable  Post vital signs: Reviewed and stable  Last Vitals:  Vitals:   09/29/17 0810 09/29/17 0938  BP: (!) 156/101 (!) 162/103  Pulse: (!) 105 (!) 116  Resp: 16 17  Temp: 36.9 C 37.2 C  SpO2: 97% 97%    Last Pain:  Vitals:   09/29/17 1047  TempSrc:   PainSc: 4       Patients Stated Pain Goal: 2 (86/48/47 2072)  Complications: No apparent anesthesia complications

## 2017-09-29 NOTE — Progress Notes (Signed)
Patient ID: [name], male   DOB: 06-30-1923, 83 y.o.   MRN: [id number]  PROGRESS NOTE    Jeffey Janssen  KVQ:[id number] DOB: Jan 03, 1923 DOA: 09/28/2017 PCP: Merrilee Seashore, MD   Brief Narrative:  83 year old male with history of atrial fibrillation on anticoagulation presented with a fall and left leg pain.  He was found to have left hip fracture.  Orthopedics was consulted   Assessment & Plan:   Active Problems:   A-fib (HCC)   Closed left hip fracture (HCC)   Closed left hip fracture after probably a mechanical fall -Orthopedics following.  Plan for surgery today.  Continue pain management.  Fall precautions.  Chronic atrial fibrillation/flutter -Currently slightly tachycardic.  Continue with Cardizem and metoprolol. -Hold Coumadin.  INR subtherapeutic today.  Patient was given vitamin K yesterday for preparation of surgery.  Leukocytosis -Probably reactive  Mild hyponatremia -Repeat a.m. labs    DVT prophylaxis: Warfarin on hold for now Code Status: DNR Family Communication: None at bedside Disposition Plan: Depends on clinical outcome  Consultants: Orthopedic  Procedures: None  Antimicrobials: None   Subjective: Patient seen and examined at bedside.  He complains of hip pain.  No overnight fever, nausea or vomiting.  Objective: Vitals:   09/29/17 0221 09/29/17 0400 09/29/17 0810 09/29/17 0938  BP: 123/80 138/89 (!) 156/101 (!) 162/103  Pulse: 80 91 (!) 105 (!) 116  Resp: 18 20 16 17   Temp: 98.8 F (37.1 C) 99.5 F (37.5 C) 98.4 F (36.9 C) 99 F (37.2 C)  TempSrc: Oral Oral Oral Oral  SpO2: 96% 97% 97% 97%  Weight:      Height:        Intake/Output Summary (Last 24 hours) at 09/29/2017 1020 Last data filed at 09/29/2017 0630 Gross per 24 hour  Intake 553.92 ml  Output 450 ml  Net 103.92 ml   Filed Weights   09/28/17 1022 09/28/17 1801  Weight: 82.6 kg (182 lb) 82.2 kg (181 lb 3.5 oz)    Examination:  General exam: Appears calm  and comfortable  Respiratory system: Bilateral decreased breath sound at bases Cardiovascular system: S1 & S2 heard, tachycardic gastrointestinal system: Abdomen is nondistended, soft and nontender. Normal bowel sounds heard. Extremities: No cyanosis, clubbing, edema    Data Reviewed: I have personally reviewed following labs and imaging studies  CBC: Recent Labs  Lab 09/28/17 1340 09/29/17 0555  WBC 13.8* 7.8  NEUTROABS 11.9*  --   HGB 12.2* 11.8*  HCT 35.1* 34.4*  MCV 95.6 96.6  PLT 194 643   Basic Metabolic Panel: Recent Labs  Lab 09/28/17 1340 09/29/17 0555  NA 132* 131*  K 4.2 4.7  CL 101 102  CO2 24 24  GLUCOSE 117* 121*  BUN 17 16  CREATININE 1.01 1.04  CALCIUM 8.8* 8.4*   GFR: Estimated Creatinine Clearance: 46.8 mL/min (by C-G formula based on SCr of 1.04 mg/dL). Liver Function Tests: No results for input(s): AST, ALT, ALKPHOS, BILITOT, PROT, ALBUMIN in the last 168 hours. No results for input(s): LIPASE, AMYLASE in the last 168 hours. No results for input(s): AMMONIA in the last 168 hours. Coagulation Profile: Recent Labs  Lab 09/28/17 1340 09/29/17 0555  INR 2.26 1.34   Cardiac Enzymes: No results for input(s): CKTOTAL, CKMB, CKMBINDEX, TROPONINI in the last 168 hours. BNP (last 3 results) No results for input(s): PROBNP in the last 8760 hours. HbA1C: No results for input(s): HGBA1C in the last 72 hours. CBG: No results for input(s): GLUCAP in the  last 168 hours. Lipid Profile: No results for input(s): CHOL, HDL, LDLCALC, TRIG, CHOLHDL, LDLDIRECT in the last 72 hours. Thyroid Function Tests: No results for input(s): TSH, T4TOTAL, FREET4, T3FREE, THYROIDAB in the last 72 hours. Anemia Panel: No results for input(s): VITAMINB12, FOLATE, FERRITIN, TIBC, IRON, RETICCTPCT in the last 72 hours. Sepsis Labs: No results for input(s): PROCALCITON, LATICACIDVEN in the last 168 hours.  Recent Results (from the past 240 hour(s))  Surgical pcr screen      Status: None   Collection Time: 09/28/17  5:00 PM  Result Value Ref Range Status   MRSA, PCR NEGATIVE NEGATIVE Final   Staphylococcus aureus NEGATIVE NEGATIVE Final    Comment: (NOTE) The Xpert SA Assay (FDA approved for NASAL specimens in patients 37 years of age and older), is one component of a comprehensive surveillance program. It is not intended to diagnose infection nor to guide or monitor treatment.          Radiology Studies: Ct Head Wo Contrast  Result Date: 09/28/2017 CLINICAL DATA:  Fall.  Patient denies head injury. EXAM: CT HEAD WITHOUT CONTRAST TECHNIQUE: Contiguous axial images were obtained from the base of the skull through the vertex without intravenous contrast. COMPARISON:  CT head dated March 09, 2015. FINDINGS: Brain: No evidence of acute infarction, hemorrhage, hydrocephalus, extra-axial collection or mass lesion/mass effect. Stable moderate cerebral atrophy and chronic microvascular ischemic white matter disease. Vascular: No hyperdense vessel or unexpected calcification. Skull: Negative for fracture or focal lesion. Sinuses/Orbits: No acute finding. Other: None. IMPRESSION: 1. No acute intracranial abnormality. Stable moderate cerebral atrophy and chronic microvascular ischemic white matter disease. Electronically Signed   By: Titus Dubin M.D.   On: 09/28/2017 15:34   Chest Portable 1 View  Result Date: 09/28/2017 CLINICAL DATA:  Preoperative evaluation for hip surgery EXAM: PORTABLE CHEST 1 VIEW COMPARISON:  03/09/2015 FINDINGS: Cardiac shadow is mildly prominent but stable from the prior exam. Aortic calcifications are again seen. Lungs are well aerated bilaterally with minimal scarring in the left base. No acute bony abnormality is seen. IMPRESSION: No acute abnormality noted. Electronically Signed   By: Inez Catalina M.D.   On: 09/28/2017 19:21   Dg Hip Unilat W Or Wo Pelvis 2-3 Views Left  Result Date: 09/28/2017 CLINICAL DATA:  Fall.  Left hip pain.  EXAM: DG HIP (WITH OR WITHOUT PELVIS) 2-3V LEFT COMPARISON:  None. FINDINGS: There is a left basicervical femoral neck fracture. No subluxation or dislocation. Mild degenerative changes in the hips bilaterally. SI joints are symmetric and unremarkable. IMPRESSION: Left basicervical femoral neck fracture. Electronically Signed   By: Rolm Baptise M.D.   On: 09/28/2017 12:24   Dg Femur Min 2 Views Left  Result Date: 09/28/2017 CLINICAL DATA:  Fall with left mid thigh pain.  Initial encounter. EXAM: LEFT FEMUR 2 VIEWS COMPARISON:  None. FINDINGS: There appears to be a minimally displaced subcapital fracture of the proximal left femur. The uppermost images are not exactly centered over the hip and further evaluation with a dedicated hip series would be helpful. The rest of the femur appears intact without fracture or lesion. Extensive vascular calcifications are present. IMPRESSION: Probable subcapital hip fracture. However, the femur films are not well centered over the hip joint and it is recommended that additional correlation be performed with a left hip x-ray series. Electronically Signed   By: Aletta Edouard M.D.   On: 09/28/2017 11:17        Scheduled Meds: . diltiazem (CARDIZEM CD) 24  hr capsule 360 mg  360 mg Oral Daily  . docusate sodium  100 mg Oral BID  . metoprolol succinate  50 mg Oral Daily   Continuous Infusions: . sodium chloride 10 mL/hr at 09/28/17 1930  .  ceFAZolin (ANCEF) IV    . lactated ringers 75 mL/hr at 09/29/17 0953  . methocarbamol (ROBAXIN)  IV       LOS: 1 day        Aline August, MD [facility] Pager [phone]  If 7PM-7AM, please contact night-coverage [email or web address] Password [email or web address] 09/29/2017, 10:20 AM

## 2017-09-29 NOTE — Progress Notes (Signed)
Peri-Operative Nsg Note: pt arrived into OR holding area, safety measures in place with SR x 4 up. Pt identified with armband, name, MRN. Pt also able to state name and DOB. Comfort measures provided. OR and Anesthesia Team informed of pts arrival

## 2017-09-29 NOTE — Anesthesia Postprocedure Evaluation (Signed)
Anesthesia Post Note  Patient: [name]  Procedure(s) Performed: ARTHROPLASTY  HIP HEMIARTHROPLASTY left (Left Hip)     Patient location during evaluation: PACU Anesthesia Type: General Level of consciousness: awake and alert Pain management: pain level controlled Vital Signs Assessment: post-procedure vital signs reviewed and stable Respiratory status: spontaneous breathing, nonlabored ventilation, respiratory function stable and patient connected to nasal cannula oxygen Cardiovascular status: blood pressure returned to baseline and stable Postop Assessment: no apparent nausea or vomiting Anesthetic complications: no    Last Vitals:  Vitals:   09/29/17 1601 09/29/17 1751  BP: (!) 140/102 140/90  Pulse: (!) 113   Resp: 14   Temp:    SpO2: 94%     Last Pain:  Vitals:   09/29/17 1047  TempSrc:   PainSc: 4     LLE Motor Response: Purposeful movement (09/29/17 1850) LLE Sensation: Full sensation (09/29/17 1850) RLE Motor Response: Purposeful movement (09/29/17 1850)        [name] DAVID

## 2017-09-29 NOTE — Progress Notes (Signed)
Patient arrived to floor in stable condition. VS stable. Alert and oriented, denies pain. TED hose placed. Will continue to closely monitor.

## 2017-09-29 NOTE — Progress Notes (Addendum)
     Subjective: Day of Surgery Procedure(s) (LRB): ARTHROPLASTY BIPOLAR HIP (HEMIARTHROPLASTY) (Left)   Patient reports pain as mild, but increase with any movement of the leg.  We have discussed his situation and possible options.  Risks, benefits and expectations were discussed with the patient.  Risks including but not limited to the risk of anesthesia, blood clots, nerve damage, blood vessel damage, failure of the prosthesis, infection and up to and including death.  Patient understand the risks, benefits and expectations and wishes to proceed with surgery. He wonders at his age if he will do well with surgery, but expresses that he knows that this gives him the best chance to recover.  Objective:   VITALS:   Vitals:   09/29/17 0400 09/29/17 0810  BP: 138/89 (!) 156/101  Pulse: 91 (!) 105  Resp: 20 16  Temp: 99.5 F (37.5 C) 98.4 F (36.9 C)  SpO2: 97% 97%    Neurovascular intact Dorsiflexion/Plantar flexion intact  LABS Recent Labs    09/28/17 1340 09/29/17 0555  HGB 12.2* 11.8*  HCT 35.1* 34.4*  WBC 13.8* 7.8  PLT 194 180    Recent Labs    09/28/17 1340 09/29/17 0555  NA 132* 131*  K 4.2 4.7  BUN 17 16  CREATININE 1.01 1.04  GLUCOSE 117* 121*     Assessment/Plan: Day of Surgery Procedure(s) (LRB): ARTHROPLASTY BIPOLAR HIP (HEMIARTHROPLASTY) (Left)   NPO now. Plan on surgery later today per Dr. Alvan Dame Plan on a left hip hemi arthroplasty     West Pugh. Babish   PAC  09/29/2017, 8:55 AM   Spoke with patient regarding his injury and treatment options.  Reviewed pros and cons, risks and benefits and wish at this point to proceed with hemiarthroplasty.  Consent ordered and signed

## 2017-09-29 NOTE — Interval H&P Note (Signed)
History and Physical Interval Note:  09/29/2017 12:39 PM  [name]  has presented today for surgery, with the diagnosis of Left hip fracture  The various methods of treatment have been discussed with the patient and family. After consideration of risks, benefits and other options for treatment, the patient has consented to  Procedure(s): ARTHROPLASTY BIPOLAR HIP (HEMIARTHROPLASTY) (Left) as a surgical intervention .  The patient's history has been reviewed, patient examined, no change in status, stable for surgery.  I have reviewed the patient's chart and labs.  Questions were answered to the patient's satisfaction.     Mauri Pole

## 2017-09-29 NOTE — H&P (View-Only) (Signed)
     Subjective: Day of Surgery Procedure(s) (LRB): ARTHROPLASTY BIPOLAR HIP (HEMIARTHROPLASTY) (Left)   Patient reports pain as mild, but increase with any movement of the leg.  We have discussed his situation and possible options.  Risks, benefits and expectations were discussed with the patient.  Risks including but not limited to the risk of anesthesia, blood clots, nerve damage, blood vessel damage, failure of the prosthesis, infection and up to and including death.  Patient understand the risks, benefits and expectations and wishes to proceed with surgery. He wonders at his age if he will do well with surgery, but expresses that he knows that this gives him the best chance to recover.  Objective:   VITALS:   Vitals:   09/29/17 0400 09/29/17 0810  BP: 138/89 (!) 156/101  Pulse: 91 (!) 105  Resp: 20 16  Temp: 99.5 F (37.5 C) 98.4 F (36.9 C)  SpO2: 97% 97%    Neurovascular intact Dorsiflexion/Plantar flexion intact  LABS Recent Labs    09/28/17 1340 09/29/17 0555  HGB 12.2* 11.8*  HCT 35.1* 34.4*  WBC 13.8* 7.8  PLT 194 180    Recent Labs    09/28/17 1340 09/29/17 0555  NA 132* 131*  K 4.2 4.7  BUN 17 16  CREATININE 1.01 1.04  GLUCOSE 117* 121*     Assessment/Plan: Day of Surgery Procedure(s) (LRB): ARTHROPLASTY BIPOLAR HIP (HEMIARTHROPLASTY) (Left)   NPO now. Plan on surgery later today per Dr. Alvan Dame Plan on a left hip hemi arthroplasty     West Pugh. Tametra Ahart   PAC  09/29/2017, 8:55 AM

## 2017-09-29 NOTE — Op Note (Signed)
NAME:  [name]                ACCOUNT NO.:  [id number]   MEDICAL RECORD NO.: [id number]   LOCATION:  9373                         FACILITY:  [facility] OF BIRTH:  10-24-2023  PHYSICIAN:  Pietro Cassis. Alvan Dame, M.D.     DATE OF PROCEDURE:  09/29/17                               OPERATIVE REPORT     PREOPERATIVE DIAGNOSIS:  Left displaced femoral neck fracture.   POSTOPERATIVE DIAGNOSIS:  Left displaced femoral neck fracture.   PROCEDURE:  Left hip hemiarthroplasty utilizing DePuy component, size 9 Hi Tri-Lock stem with a 70mm unipolar ball with a +0 adapter.   SURGEON:  Pietro Cassis. Alvan Dame, MD   ASSISTANT:  Danae [place], PA-C.   ANESTHESIA:  General.   SPECIMENS:  None.   DRAINS:  None.   BLOOD LOSS:  About 250 cc.   COMPLICATIONS:  None.   INDICATION OF PROCEDURE:  [name] is a pleasant 83 year old male who lives independently.  He unfortunately had a fall at his house.  He was admitted to the hospital after radiographs revealed a femoral neck fracture.  He was seen and evaluated and was scheduled for surgery for fixation.  The necessity of surgical repair was discussed with she and her family.  Consent was obtained after reviewing risks of infection, DVT, component failure, and need for revision surgery.   PROCEDURE IN DETAIL:  The patient was brought to the operative theater. Once adequate anesthesia, preoperative antibiotics, 2 g of Ancef, 1 gm of TXA, and Decadron administered, the patient was positioned into the right lateral decubitus position with the left side up.  The left lower extremity was then prepped and draped in sterile fashion.  A time-out was performed identifying the patient, planned procedure, and extremity.   A lateral incision was made off the proximal trochanter. Sharp dissection was carried down to the iliotibial band and gluteal fascia. The gluteal fascia was then incised for posterior approach.  The short external rotators were taken down  separate from the posterior capsule. An L capsulotomy was made preserving the posterior leaflet for later anatomic repair. Fracture site was identified and after removing comminuted segments of the posterior femoral neck, the femoral head was removed without difficulty and measured on the back table  using the sizing rings and determined to be 2mm mm in diameter.   The proximal femur was then exposed.  Retractors placed.  I then drilled, opened the proximal femur.  Then I hand reamed once and  Irrigated the canal to try to prevent fat emboli.  I began broaching the femur with a starter broach up to a size 9 broach with good medial and lateral metaphyseal fit without evidence of any torsion or movement.  A trial reduction was carried out with a hi offset neck and a +0 adapter with a 23mm ball.  The hip reduced nicely.  The leg lengths appeared to be equal compared to the down leg.   The hip went through a range of motion without evidence of any subluxation or impingement.   Given these findings, the trial components removed.  The final 9 Hi  Tri-Lock stem was opened.  After irrigating the  canal, the final stem was impacted and sat at the level where the broach was. Based on this and the trial reduction, a +0 adapter was opened and impacted in the 21mm unipolar ball onto a clean and dry trunnion.  The hip had been irrigated throughout the case and again at this point.  I re- Approximated the posterior capsule to the superior leaflet using a  #1 Vicryl.  The remainder of the wound was closed with #1 Vicryl and #0 Stratafix sutures in the iliotibial band and gluteal fascia, a  2-0 Vicryl in the sub-Q tissue and a running 4-0 Monocryl in the skin.  The hip was cleaned, dried, and dressed sterilely using Dermabond and Aquacel dressing.  He was then brought to recovery room, extubated in stable condition, tolerating the procedure well.  Danae [place], PA-C was present and utilized as  Environmental consultant for the entire case from  Preoperative positioning to management of the contralateral extremity and retractors to  General facilitation of the procedure.  He was also involved with primary wound closure.         Pietro Cassis Alvan Dame, M.D.

## 2017-09-29 NOTE — Progress Notes (Signed)
Patient rested all morning before surgery.  Wife and son at bedside.

## 2017-09-29 NOTE — Progress Notes (Signed)
ANTICOAGULATION CONSULT NOTE - Initial Consult  Pharmacy Consult for Coumadin Indication: atrial fibrillation and VTE prophylaxis post-op  Allergies  Allergen Reactions  . Shellfish Allergy Anaphylaxis  . Sulfa Antibiotics Anaphylaxis    Patient Measurements: Height: 5\' 10"  (177.8 cm) Weight: 181 lb 3.5 oz (82.2 kg) IBW/kg (Calculated) : 73  Vital Signs: Temp: 99 F (37.2 C) (11/18 0938) Temp Source: Oral (11/18 0938) BP: 162/103 (11/18 0938) Pulse Rate: 116 (11/18 0938)  Labs: Recent Labs    09/28/17 1340 09/29/17 0555  HGB 12.2* 11.8*  HCT 35.1* 34.4*  PLT 194 180  LABPROT 24.7* 16.5*  INR 2.26 1.34  CREATININE 1.01 1.04    Estimated Creatinine Clearance: 46.8 mL/min (by C-G formula based on SCr of 1.04 mg/dL).   Medical History: Past Medical History:  Diagnosis Date  . A-fib ([place])   . Dysrhythmia    Atrial Tachycardia with block  . Erosive esophagitis   . GERD (gastroesophageal reflux disease)   . H/O fracture of rib    Left  5th, 6th, 7th had a fall in APRIL 2016  . Hypertension   . RA (rheumatoid arthritis) (HCC)     Medications:  PTA Coumadin 2mg  daily except 4mg  on Wednesday  Assessment: 83 yo M on chronic Coumadin for Afib.  He is admitted with hip fx.  INR was therapeutic on admission (2.26) and reversed for surgery with Vit K IV 5mg .  He is now s/p surgical repair today. Pharmacy consulted to resume warfarin.  09/29/2017:  INR subtherapeutic (1.34) after 5mg  IV Vit K given yesterday  CBC: Hg low (11.8), pltc WNL  Anticipate post-op anemia, but no active bleeding reported.   Diet: NPO for surgery  No drug interactions noted.    Goal of Therapy:  INR 2-3   Plan:  Coumadin 4mg  po x1 today Lovenox 40mg  sq daily until INR >2 Daily INR  Biagio Borg 09/29/2017,2:22 PM

## 2017-09-30 ENCOUNTER — Encounter (HOSPITAL_COMMUNITY): Payer: Self-pay | Admitting: Orthopedic Surgery

## 2017-09-30 DIAGNOSIS — Z96649 Presence of unspecified artificial hip joint: Secondary | ICD-10-CM

## 2017-09-30 DIAGNOSIS — D72829 Elevated white blood cell count, unspecified: Secondary | ICD-10-CM

## 2017-09-30 LAB — BASIC METABOLIC PANEL
Anion gap: 10 (ref 5–15)
BUN: 26 mg/dL — AB (ref 6–20)
CHLORIDE: 98 mmol/L — AB (ref 101–111)
CO2: 22 mmol/L (ref 22–32)
Calcium: 8.6 mg/dL — ABNORMAL LOW (ref 8.9–10.3)
Creatinine, Ser: 1.19 mg/dL (ref 0.61–1.24)
GFR calc Af Amer: 59 mL/min — ABNORMAL LOW (ref >60)
GFR calc non Af Amer: 51 mL/min — ABNORMAL LOW (ref >60)
GLUCOSE: 171 mg/dL — AB (ref 65–99)
POTASSIUM: 4.1 mmol/L (ref 3.5–5.1)
SODIUM: 130 mmol/L — AB (ref 135–145)

## 2017-09-30 LAB — CBC WITH DIFFERENTIAL/PLATELET
Basophils Absolute: 0 10*3/uL (ref 0.0–0.1)
Basophils Relative: 0 %
Eosinophils Absolute: 0 10*3/uL (ref 0.0–0.7)
Eosinophils Relative: 0 %
HCT: 33.2 % — ABNORMAL LOW (ref 39.0–52.0)
HEMOGLOBIN: 11.3 g/dL — AB (ref 13.0–17.0)
LYMPHS ABS: 0.5 10*3/uL — AB (ref 0.7–4.0)
LYMPHS PCT: 4 %
MCH: 32.8 pg (ref 26.0–34.0)
MCHC: 34 g/dL (ref 30.0–36.0)
MCV: 96.2 fL (ref 78.0–100.0)
Monocytes Absolute: 0.8 10*3/uL (ref 0.1–1.0)
Monocytes Relative: 7 %
NEUTROS ABS: 11.1 10*3/uL — AB (ref 1.7–7.7)
NEUTROS PCT: 89 %
Platelets: 187 10*3/uL (ref 150–400)
RBC: 3.45 MIL/uL — AB (ref 4.22–5.81)
RDW: 14.9 % (ref 11.5–15.5)
WBC: 12.4 10*3/uL — AB (ref 4.0–10.5)

## 2017-09-30 LAB — PROTIME-INR
INR: 1.31
Prothrombin Time: 16.2 seconds — ABNORMAL HIGH (ref 11.4–15.2)

## 2017-09-30 LAB — MAGNESIUM: MAGNESIUM: 1.8 mg/dL (ref 1.7–2.4)

## 2017-09-30 MED ORDER — ENSURE ENLIVE PO LIQD
237.0000 mL | Freq: Two times a day (BID) | ORAL | Status: DC
  Administered 2017-09-30 – 2017-10-03 (×3): 237 mL via ORAL

## 2017-09-30 MED ORDER — CEPHALEXIN 500 MG PO CAPS: 500.0000 mg | ORAL_CAPSULE | Freq: Three times a day (TID) | ORAL | Status: DC

## 2017-09-30 MED ORDER — LORAZEPAM 2 MG/ML IJ SOLN
0.5000 mg | Freq: Once | INTRAMUSCULAR | Status: AC
  Administered 2017-09-30: 0.5 mg via INTRAVENOUS
  Filled 2017-09-30: qty 1

## 2017-09-30 MED ORDER — CEPHALEXIN 500 MG PO CAPS
500.0000 mg | ORAL_CAPSULE | Freq: Three times a day (TID) | ORAL | Status: AC
  Administered 2017-09-30 – 2017-10-01 (×6): 500 mg via ORAL
  Filled 2017-09-30 (×6): qty 1

## 2017-09-30 MED ORDER — WARFARIN SODIUM 4 MG PO TABS
4.0000 mg | ORAL_TABLET | Freq: Once | ORAL | Status: AC
  Administered 2017-09-30: 4 mg via ORAL
  Filled 2017-09-30: qty 1

## 2017-09-30 MED ORDER — ALUM & MAG HYDROXIDE-SIMETH 200-200-20 MG/5ML PO SUSP
30.0000 mL | Freq: Once | ORAL | Status: AC
  Administered 2017-09-30: 30 mL via ORAL
  Filled 2017-09-30: qty 30

## 2017-09-30 MED ORDER — ALUM & MAG HYDROXIDE-SIMETH 200-200-20 MG/5ML PO SUSP
30.0000 mL | Freq: Four times a day (QID) | ORAL | Status: DC | PRN
  Administered 2017-09-30 – 2017-10-01 (×2): 30 mL via ORAL
  Filled 2017-09-30 (×2): qty 30

## 2017-09-30 NOTE — Clinical Social Work Note (Signed)
Clinical Social Work Assessment  Patient Details  Name: [name] MRN: [id number] Date of Birth: 1923-03-26  Date of referral:  09/30/17               Reason for consult:  Facility Placement                Permission sought to share information with:  Family Supports Permission granted to share information::  Yes, Verbal Permission Granted  Name::     son Ajene, wife Blanch Media  Agency::     Relationship::     Contact Information:     Housing/Transportation Living arrangements for the past 2 months:  Single Family Home Source of Information:  Patient Patient Interpreter Needed:  None Criminal Activity/Legal Involvement Pertinent to Current Situation/Hospitalization:  No - Comment as needed Significant Relationships:  Spouse, Adult Children, Friend, Other Family Members Lives with:  Self Do you feel safe going back to the place where you live?  Yes Need for family participation in patient care:  No (Coment)  Care giving concerns:  Pt from home where he resides with his wife. At baseline is independent with ADLs and uses a rolling walker for assistance with ambulation. Pt now post hip fracture and requiring assistance to ambulate.   Social Worker assessment / plan:   CSW consulted to assist with SNF placement for short term rehab. Met with pt, wife, and son at bedside. All were aware of the recommendation and in agreement to pursue SNF. Pt went to rehab 2 years ago and is familiar with the process. Provided facility list to family- they prefer [facility] and will review for back-up options in case [facility] not available. Completed FL2 and referrals.   Plan: SNF for ST rehab at DC- will follow up with bed offers.   Employment status:  Retired Forensic scientist:  [employer] PT Recommendations:  [place] / Referral to community resources:  [place]  Patient/Family's Response to care:  Very happy and engaged in care  Patient/Family's  Understanding of and Emotional Response to Diagnosis, Current Treatment, and Prognosis:  Both pt and his family demonstrate adequate understanding of treatment and plan. All hopeful re: DC plan for SNF and feel positive. Pt emotionally very pleasant- reports he feels positive about his goal to rehab and return home.   Emotional Assessment Appearance:  Appears stated age Attitude/Demeanor/Rapport:  (pleasant) Affect (typically observed):  Happy Orientation:  Oriented to Self, Oriented to Place, Oriented to  Time Alcohol / Substance use:  Not Applicable Psych involvement (Current and /or in the community):  No (Comment)  Discharge Needs  Concerns to be addressed:  Discharge Planning Concerns Readmission within the last 30 days:  No Current discharge risk:  None Barriers to Discharge:  Continued Medical Work up   [employer], LCSW 09/30/2017, 4:07 PM [phone]

## 2017-09-30 NOTE — Progress Notes (Signed)
Initial Nutrition Assessment  INTERVENTION:   Provide Ensure Enlive po BID, each supplement provides 350 kcal and 20 grams of protein RD will continue to monitor  NUTRITION DIAGNOSIS:   Increased nutrient needs related to post-op healing as evidenced by estimated needs.  GOAL:   Patient will meet greater than or equal to 90% of their needs  MONITOR:   PO intake, Supplement acceptance, Labs, Weight trends, I & O's  REASON FOR ASSESSMENT:   Consult Hip fracture protocol  ASSESSMENT:   83 year old male with history of atrial fibrillation on anticoagulation presented with a fall and left leg pain.  He was found to have left hip fracture.  Orthopedics was consulted. 11/18: s/p ARTHROPLASTY  HIP HEMIARTHROPLASTY left (Left)  Patient currently consuming 50% of meals at this time. Pt's weight has remained stable in the 180's.  Pt would benefit from nutritional supplementation for at least 1 month post-op given advanced age. Will order Ensure supplements.  Medications: Colace capsule BID, Ferrous sulfate TID  Labs reviewed: Low Na Mg WNL   NUTRITION - FOCUSED PHYSICAL EXAM:  Nutrition focused physical exam shows no sign of depletion of muscle mass or body fat.  Diet Order:  Diet regular Room service appropriate? Yes; Fluid consistency: Thin  EDUCATION NEEDS:   No education needs have been identified at this time  Skin:  Skin Assessment: Reviewed RN Assessment  Last BM:  11/17  Height:   Ht Readings from Last 1 Encounters:  09/28/17 5\' 10"  (1.778 m)    Weight:   Wt Readings from Last 1 Encounters:  09/28/17 181 lb 3.5 oz (82.2 kg)    Ideal Body Weight:  75.5 kg  BMI:  Body mass index is 26 kg/m.  Estimated Nutritional Needs:   Kcal:  1700-1900  Protein:  80-90g  Fluid:  1.7L/day  Clayton Bibles, MS, RD, LDN [facility] [facility] Pager: [phone] After Hours Pager: [phone]

## 2017-09-30 NOTE — NC FL2 (Signed)
[place] LEVEL OF CARE SCREENING TOOL     IDENTIFICATION  Patient Name: [name] Birthdate: 1923/01/26 Sex: male Admission Date (Current Location): 09/28/2017  [facility] and [place] Number:  Herbalist and Address:  [facility],  [place] [address] [place]      Provider Number: [id number]  Attending Physician Name and Address:  Aline August, MD  Relative Name and Phone Number:       Current Level of Care: Hospital Recommended Level of Care: [place] Prior Approval Number:    Date Approved/Denied:   PASRR Number: [id number]    Discharge Plan: SNF    Current Diagnoses: Patient Active Problem List   Diagnosis Date Noted  . Closed left hip fracture ([place]) 09/28/2017  . Dysphagia 05/04/2015  . Stricture and stenosis of esophagus 05/04/2015  . GI bleed 03/12/2015  . Anemia 03/12/2015  . Left rib fracture- 5th, 6th, 7th 03/12/2015  . Hematemesis 03/12/2015  . Hyponatremia 03/11/2015  . UTI (lower urinary tract infection) 03/11/2015  . A-fib ([place]) 03/11/2015  . Weakness 03/11/2015  . Scalp hematoma 03/11/2015  . Warfarin-induced coagulopathy ([place]) 03/11/2015    Orientation RESPIRATION BLADDER Height & Weight     Self, Time, Situation, Place  Normal Continent Weight: 181 lb 3.5 oz (82.2 kg) Height:  5\' 10"  (177.8 cm)  BEHAVIORAL SYMPTOMS/MOOD NEUROLOGICAL BOWEL NUTRITION STATUS      Continent Diet(regular diet, thin fluids)  AMBULATORY STATUS COMMUNICATION OF NEEDS Skin   Limited Assist Verbally Surgical wounds                       Personal Care Assistance Level of Assistance  Bathing, Feeding, Dressing Bathing Assistance: Limited assistance Feeding assistance: Independent Dressing Assistance: Limited assistance     Functional Limitations Info  Sight, Hearing, Speech Sight Info: Adequate Hearing Info: Adequate Speech Info: Adequate    SPECIAL CARE FACTORS FREQUENCY  PT (By licensed  PT), OT (By licensed OT)     PT Frequency: 5x OT Frequency: 5x            Contractures Contractures Info: Not present    Additional Factors Info  Code Status, Allergies Code Status Info: DNR Allergies Info: Shellfish Allergy, Sulfa Antibiotics           Current Medications (09/30/2017):  This is the current hospital active medication list Current Facility-Administered Medications  Medication Dose Route Frequency Provider Last Rate Last Dose  . 0.9 %  sodium chloride infusion   Intravenous Continuous Bonnielee Haff, MD 10 mL/hr at 09/28/17 1930    . bisacodyl (DULCOLAX) suppository 10 mg  10 mg Rectal Daily PRN Babish, Rodman Key, PA-C      . cephALEXin (KEFLEX) capsule 500 mg  500 mg Oral TID Aline August, MD   500 mg at 09/30/17 1130  . diltiazem (CARDIZEM CD) 24 hr capsule 360 mg  360 mg Oral Daily Bonnielee Haff, MD   360 mg at 09/30/17 0900  . docusate sodium (COLACE) capsule 100 mg  100 mg Oral BID Bonnielee Haff, MD   100 mg at 09/30/17 0901  . enoxaparin (LOVENOX) injection 40 mg  40 mg Subcutaneous Q24H Danae Orleans, PA-C   40 mg at 09/30/17 0859  . feeding supplement (ENSURE ENLIVE) (ENSURE ENLIVE) liquid 237 mL  237 mL Oral BID BM Alekh, Kshitiz, MD      . ferrous sulfate tablet 325 mg  325 mg Oral TID PC Danae Orleans, PA-C  325 mg at 09/30/17 1251  . HYDROcodone-acetaminophen (NORCO/VICODIN) 5-325 MG per tablet 1-2 tablet  1-2 tablet Oral Q6H PRN Bonnielee Haff, MD   1 tablet at 09/30/17 1343  . magnesium citrate solution 1 Bottle  1 Bottle Oral Once PRN Babish, Matthew, PA-C      . menthol-cetylpyridinium (CEPACOL) lozenge 3 mg  1 lozenge Oral PRN Danae Orleans, PA-C       Or  . phenol (CHLORASEPTIC) mouth spray 1 spray  1 spray Mouth/Throat PRN Babish, Matthew, PA-C      . methocarbamol (ROBAXIN) tablet 500 mg  500 mg Oral Q6H PRN Bonnielee Haff, MD       Or  . methocarbamol (ROBAXIN) 500 mg in dextrose 5 % 50 mL IVPB  500 mg Intravenous Q6H PRN  Bonnielee Haff, MD      . metoCLOPramide (REGLAN) tablet 5-10 mg  5-10 mg Oral Q8H PRN Danae Orleans, PA-C       Or  . metoCLOPramide (REGLAN) injection 5-10 mg  5-10 mg Intravenous Q8H PRN Babish, Matthew, PA-C      . metoprolol succinate (TOPROL-XL) 24 hr tablet 50 mg  50 mg Oral Daily Bonnielee Haff, MD   50 mg at 09/30/17 0901  . morphine 4 MG/ML injection 0.52 mg  0.52 mg Intravenous Q2H PRN Bonnielee Haff, MD   0.52 mg at 09/28/17 1845  . ondansetron (ZOFRAN) tablet 4 mg  4 mg Oral Q6H PRN Danae Orleans, PA-C       Or  . ondansetron [facility]) injection 4 mg  4 mg Intravenous Q6H PRN Babish, Matthew, PA-C      . polyethylene glycol (MIRALAX / GLYCOLAX) packet 17 g  17 g Oral Daily PRN Danae Orleans, PA-C      . warfarin (COUMADIN) tablet 4 mg  4 mg Oral ONCE-1800 Cash, Mary, [place]      . Warfarin - Pharmacist Dosing Inpatient   Does not apply q1800 Thomes Lolling, [facility]         Discharge Medications: Please see discharge summary for a list of discharge medications.  Relevant Imaging Results:  Relevant Lab Results:   Additional Information SS#[id number]  Nila Nephew, LCSW

## 2017-09-30 NOTE — Progress Notes (Signed)
OT Cancellation Note  Patient Details Name: [name] MRN: [id number] DOB: 13-Feb-1923   Cancelled Treatment:    Reason Eval/Treat Not Completed: Fatigue/lethargy limiting ability to participate. Pt just ambulated with PT and resting up in recliner [name] 09/30/2017, 2:13 PM

## 2017-09-30 NOTE — Progress Notes (Signed)
ANTICOAGULATION CONSULT NOTE - Follow Up Consult  Pharmacy Consult for warfarin Indication: atrial fibrillation and VTE prophylaxis post-op  Allergies  Allergen Reactions  . Shellfish Allergy Anaphylaxis  . Sulfa Antibiotics Anaphylaxis    Patient Measurements: Height: 5\' 10"  (177.8 cm) Weight: 181 lb 3.5 oz (82.2 kg) IBW/kg (Calculated) : 73  Vital Signs: Temp: 98.2 F (36.8 C) (11/19 0436) Temp Source: Oral (11/19 0436) BP: 153/92 (11/19 3710) Pulse Rate: 115 (11/19 0613)  Labs: Recent Labs    09/28/17 1340 09/29/17 0555 09/30/17 0514  HGB 12.2* 11.8* 11.3*  HCT 35.1* 34.4* 33.2*  PLT 194 180 187  LABPROT 24.7* 16.5* 16.2*  INR 2.26 1.34 1.31  CREATININE 1.01 1.04 1.19    Estimated Creatinine Clearance: 40.9 mL/min (by C-G formula based on SCr of 1.19 mg/dL).   Medications:  PTA warfarin 2mg  daily except 4 mg on Wednesday.   Assessment: 83 y.o. Male with history of Afib on warfarin admitted 11/17 with hip fx. INR was therapeutic on admission at 2.26 and reversed with Vit K IV 5 mg prior to surgery. He is now s/p surgical repair on 11/18. Pharmacy consulted to resume warfarin.   09/30/2017 - INR subtherapeutic at 1.31 following INR reversal with Vit K prior to surgery and warfarin 4 mg restart yesterday.  - Hgb and plt stable  - No documented signs of active bleed - Diet: per charting, no meals eaten - No significant drug interactions noted. Patient now on home Keflex.   Goal of Therapy:  INR 2-3 Monitor platelets by anticoagulation protocol: Yes   Plan:  - warfarin 4 mg x1  - continue enoxaparin 40 mg daily until INR is 2-3 due to Afib indication - daily INR - monitor CBC - monitor signs and symptoms of bleeding   Richmond Campbell  PharmD Candidate 09/30/2017,8:34 AM

## 2017-09-30 NOTE — Progress Notes (Signed)
Patient c/o indigestion/heartburn. Heart rate is also 114-117. Patient slightly anxious regarding IV that was leaking. Jeannette Corpus NP notified. Verbal order received for 71mL of Maalox and to monitor heart rate.

## 2017-09-30 NOTE — Progress Notes (Signed)
Patient ID: Micheil Klaus, male   DOB: 1923-06-07, 83 y.o.   MRN: [id number]  PROGRESS NOTE    Kollin Udell  WUX:[id number] DOB: 1923/11/05 DOA: 09/28/2017 PCP: Merrilee Seashore, MD   Brief Narrative:  83 year old male with history of atrial fibrillation on anticoagulation presented with a fall and left leg pain.  He was found to have left hip fracture.  Orthopedics was consulted.   Assessment & Plan:   Active Problems:   A-fib ([place])   Closed left hip fracture ([facility])   Closed left hip fracture after probably a mechanical fall -Status post surgery on 09/29/2017.  Orthopedics following. Continue pain management.  Fall precautions. -PT/OT eval.  Activity and wound care as per orthopedics recommendations  Chronic atrial fibrillation/flutter -Currently slightly tachycardic.  Continue with Cardizem and metoprolol. -Coumadin restarted.  INR subtherapeutic.  Repeat a.m. INR  Leukocytosis -Probably reactive  Mild hyponatremia -Repeat a.m. labs    DVT prophylaxis: Coumadin  code Status: DNR Family Communication: None at bedside Disposition Plan: Probable nursing home in 1-2 days  Consultants: Orthopedic  Procedures: Left hip hemiarthroplasty on 09/29/2017  Antimicrobials: Patient has been on Keflex as an outpatient   Subjective: Patient seen and examined at bedside.  He denies any current hip pain.  No overnight fever, nausea or vomiting.  Objective: Vitals:   09/30/17 0206 09/30/17 0436 09/30/17 0613 09/30/17 0900  BP: 136/87 (!) 147/92 (!) 153/92 (!) 153/94  Pulse: 90 (!) 114 (!) 115 (!) 118  Resp: 16 20  18   Temp: 98.6 F (37 C) 98.2 F (36.8 C)    TempSrc: Oral Oral    SpO2: 95% 97%    Weight:      Height:        Intake/Output Summary (Last 24 hours) at 09/30/2017 1122 Last data filed at 09/30/2017 0600 Gross per 24 hour  Intake 2279.83 ml  Output 500 ml  Net 1779.83 ml   Filed Weights   09/28/17 1022 09/28/17 1801  Weight: 82.6 kg (182 lb) 82.2 kg  (181 lb 3.5 oz)    Examination:  General exam: Appears calm and comfortable; elderly male lying in bed Respiratory system: Bilateral decreased breath sound at bases Cardiovascular system: S1 & S2 heard, tachycardic gastrointestinal system: Abdomen is nondistended, soft and nontender. Normal bowel sounds heard. Extremities: No cyanosis, clubbing, edema    Data Reviewed: I have personally reviewed following labs and imaging studies  CBC: Recent Labs  Lab 09/28/17 1340 09/29/17 0555 09/30/17 0514  WBC 13.8* 7.8 12.4*  NEUTROABS 11.9*  --  11.1*  HGB 12.2* 11.8* 11.3*  HCT 35.1* 34.4* 33.2*  MCV 95.6 96.6 96.2  PLT 194 180 253   Basic Metabolic Panel: Recent Labs  Lab 09/28/17 1340 09/29/17 0555 09/30/17 0514  NA 132* 131* 130*  K 4.2 4.7 4.1  CL 101 102 98*  CO2 24 24 22   GLUCOSE 117* 121* 171*  BUN 17 16 26*  CREATININE 1.01 1.04 1.19  CALCIUM 8.8* 8.4* 8.6*  MG  --   --  1.8   GFR: Estimated Creatinine Clearance: 40.9 mL/min (by C-G formula based on SCr of 1.19 mg/dL). Liver Function Tests: No results for input(s): AST, ALT, ALKPHOS, BILITOT, PROT, ALBUMIN in the last 168 hours. No results for input(s): LIPASE, AMYLASE in the last 168 hours. No results for input(s): AMMONIA in the last 168 hours. Coagulation Profile: Recent Labs  Lab 09/28/17 1340 09/29/17 0555 09/30/17 0514  INR 2.26 1.34 1.31   Cardiac Enzymes: No results  for input(s): CKTOTAL, CKMB, CKMBINDEX, TROPONINI in the last 168 hours. BNP (last 3 results) No results for input(s): PROBNP in the last 8760 hours. HbA1C: No results for input(s): HGBA1C in the last 72 hours. CBG: No results for input(s): GLUCAP in the last 168 hours. Lipid Profile: No results for input(s): CHOL, HDL, LDLCALC, TRIG, CHOLHDL, LDLDIRECT in the last 72 hours. Thyroid Function Tests: No results for input(s): TSH, T4TOTAL, FREET4, T3FREE, THYROIDAB in the last 72 hours. Anemia Panel: No results for input(s):  VITAMINB12, FOLATE, FERRITIN, TIBC, IRON, RETICCTPCT in the last 72 hours. Sepsis Labs: No results for input(s): PROCALCITON, LATICACIDVEN in the last 168 hours.  Recent Results (from the past 240 hour(s))  Surgical pcr screen     Status: None   Collection Time: 09/28/17  5:00 PM  Result Value Ref Range Status   MRSA, PCR NEGATIVE NEGATIVE Final   Staphylococcus aureus NEGATIVE NEGATIVE Final    Comment: (NOTE) The Xpert SA Assay (FDA approved for NASAL specimens in patients 40 years of age and older), is one component of a comprehensive surveillance program. It is not intended to diagnose infection nor to guide or monitor treatment.          Radiology Studies: Ct Head Wo Contrast  Result Date: 09/28/2017 CLINICAL DATA:  Fall.  Patient denies head injury. EXAM: CT HEAD WITHOUT CONTRAST TECHNIQUE: Contiguous axial images were obtained from the base of the skull through the vertex without intravenous contrast. COMPARISON:  CT head dated March 09, 2015. FINDINGS: Brain: No evidence of acute infarction, hemorrhage, hydrocephalus, extra-axial collection or mass lesion/mass effect. Stable moderate cerebral atrophy and chronic microvascular ischemic white matter disease. Vascular: No hyperdense vessel or unexpected calcification. Skull: Negative for fracture or focal lesion. Sinuses/Orbits: No acute finding. Other: None. IMPRESSION: 1. No acute intracranial abnormality. Stable moderate cerebral atrophy and chronic microvascular ischemic white matter disease. Electronically Signed   By: Titus Dubin M.D.   On: 09/28/2017 15:34   Pelvis Portable  Result Date: 09/29/2017 CLINICAL DATA:  Status post left hip replacement. EXAM: PORTABLE PELVIS 1-2 VIEWS COMPARISON:  09/18/2017 FINDINGS: Interval left hip hemiarthroplasty. No evidence for immediate hardware complication. Gas in the soft tissues compatible with the recent surgery. Bones diffusely demineralized. IMPRESSION: Status post left hip  hemiarthroplasty without evidence for immediate hardware complications. Electronically Signed   By: Misty Stanley M.D.   On: 09/29/2017 15:17   Chest Portable 1 View  Result Date: 09/28/2017 CLINICAL DATA:  Preoperative evaluation for hip surgery EXAM: PORTABLE CHEST 1 VIEW COMPARISON:  03/09/2015 FINDINGS: Cardiac shadow is mildly prominent but stable from the prior exam. Aortic calcifications are again seen. Lungs are well aerated bilaterally with minimal scarring in the left base. No acute bony abnormality is seen. IMPRESSION: No acute abnormality noted. Electronically Signed   By: Inez Catalina M.D.   On: 09/28/2017 19:21   Dg Hip Unilat W Or Wo Pelvis 2-3 Views Left  Result Date: 09/28/2017 CLINICAL DATA:  Fall.  Left hip pain. EXAM: DG HIP (WITH OR WITHOUT PELVIS) 2-3V LEFT COMPARISON:  None. FINDINGS: There is a left basicervical femoral neck fracture. No subluxation or dislocation. Mild degenerative changes in the hips bilaterally. SI joints are symmetric and unremarkable. IMPRESSION: Left basicervical femoral neck fracture. Electronically Signed   By: Rolm Baptise M.D.   On: 09/28/2017 12:24        Scheduled Meds: . cephALEXin  500 mg Oral TID  . diltiazem (CARDIZEM CD) 24 hr capsule 360 mg  360 mg Oral Daily  . docusate sodium  100 mg Oral BID  . enoxaparin (LOVENOX) injection  40 mg Subcutaneous Q24H  . ferrous sulfate  325 mg Oral TID PC  . metoprolol succinate  50 mg Oral Daily  . Warfarin - Pharmacist Dosing Inpatient   Does not apply q1800   Continuous Infusions: . sodium chloride 10 mL/hr at 09/28/17 1930  . methocarbamol (ROBAXIN)  IV       LOS: 2 days        Aline August, MD [facility] Pager [phone]  If 7PM-7AM, please contact night-coverage [email or web address] Password [email or web address] 09/30/2017, 11:22 AM

## 2017-09-30 NOTE — Progress Notes (Signed)
Charge nurse, [name] Carry, RN spoke with Lamar Blinks regarding patients emesis like substance explaining that it was slightly more coffee ground/watery brown. IV access was lost but restarted and patient agreed to try medication for nausea. Zofran given at 2315. Will continue to monitor and follow up with patients response.

## 2017-09-30 NOTE — Progress Notes (Signed)
Patient ID: [name], male   DOB: 1923-07-02, 83 y.o.   MRN: [id number] Subjective: 1 Day Post-Op Procedure(s) (LRB): ARTHROPLASTY  HIP HEMIARTHROPLASTY left (Left)    Patient reports pain as mild to moderate.  He states that he feels his hip is doing fine.  Had issues with HR and blood pressure that are a bit concerning to him.  Ready to do therapy if heart stable  Objective:   VITALS:   Vitals:   09/30/17 0613 09/30/17 0900  BP: (!) 153/92 (!) 153/94  Pulse: (!) 115 (!) 118  Resp:  18  Temp:    SpO2:      Neurovascular intact Incision: dressing C/D/I  LABS Recent Labs    09/28/17 1340 09/29/17 0555 09/30/17 0514  HGB 12.2* 11.8* 11.3*  HCT 35.1* 34.4* 33.2*  WBC 13.8* 7.8 12.4*  PLT 194 180 187    Recent Labs    09/28/17 1340 09/29/17 0555 09/30/17 0514  NA 132* 131* 130*  K 4.2 4.7 4.1  BUN 17 16 26*  CREATININE 1.01 1.04 1.19  GLUCOSE 117* 121* 171*    Recent Labs    09/29/17 0555 09/30/17 0514  INR 1.34 1.31     Assessment/Plan: 1 Day Post-Op Procedure(s) (LRB): ARTHROPLASTY  HIP HEMIARTHROPLASTY left (Left)   Advance diet Up with therapy   D/C plan pending therapy assessment and needs at home versus ST SNF Resumed coumadin for a-fib as well as chemoprophylaxis for DVT

## 2017-09-30 NOTE — Progress Notes (Signed)
Patient c/o hiccups, indigestion, requesting maalox. Spitting up brown stringy substance. Lamar Blinks NP paged. New orders received. Will continue to monitor.

## 2017-09-30 NOTE — Evaluation (Signed)
Physical Therapy Evaluation Patient Details Name: [name] MRN: [id number] DOB: 1923/05/27 Today's Date: 09/30/2017   History of Present Illness  83 yo male admitted with L hip fx after sustaining a fall at home. S/P L hip hemi 09/29/17. Hx of A fib, RA, falls.   Clinical Impression  On eval, pt required Min assist +2 for mobility. He walked ~25 feet with a RW. Pain rated 5/10 with activity. Educated pt on posterior hip precautions and WBAT status. Discussed d/c plan-pt will need ST rehab at a SNF-he acknowledges this. Will follow and progress activity as tolerated.     Follow Up Recommendations SNF    Equipment Recommendations  None recommended by PT    Recommendations for Other Services       Precautions / Restrictions Precautions Precautions: Fall;Posterior Hip Precaution Comments: Educated pt on posterior hip precautions Restrictions Weight Bearing Restrictions: No LLE Weight Bearing: Weight bearing as tolerated      Mobility  Bed Mobility Overal bed mobility: Needs Assistance Bed Mobility: Supine to Sit     Supine to sit: Min assist;HOB elevated     General bed mobility comments: Small amount of assist for L LE. Cues and assist to maintain L hip precautions.   Transfers Overall transfer level: Needs assistance Equipment used: Rolling walker (2 wheeled) Transfers: Sit to/from Stand Sit to Stand: From elevated surface;Min assist;+2 physical assistance;+2 safety/equipment         General transfer comment: Assist to rise, stabilize, control descent. VCS safety, technique, hand/LE placement.   Ambulation/Gait Ambulation/Gait assistance: Min assist;+2 physical assistance;+2 safety/equipment Ambulation Distance (Feet): 25 Feet Assistive device: Rolling walker (2 wheeled) Gait Pattern/deviations: Step-to pattern;Antalgic     General Gait Details: Assist to stabilize pt, maneuver safely with RW and to follow with the recliner. VCs safety, technique,  sequence. HR 119 bpm. Dyspnea 2/4  Stairs            Wheelchair Mobility    Modified Rankin (Stroke Patients Only)       Balance                                             Pertinent Vitals/Pain Pain Assessment: 0-10 Pain Score: 5  Pain Location: L hip Pain Descriptors / Indicators: Sore;Sharp Pain Intervention(s): Limited activity within patient's tolerance;Repositioned    Home Living Family/patient expects to be discharged to:: Skilled nursing facility                      Prior Function Level of Independence: Independent with assistive device(s)         Comments: cane for community. RW at night     Hand Dominance        Extremity/Trunk Assessment   Upper Extremity Assessment Upper Extremity Assessment: Defer to OT evaluation    Lower Extremity Assessment Lower Extremity Assessment: Generalized weakness(s/p L hip hemi)    Cervical / Trunk Assessment Cervical / Trunk Assessment: Kyphotic  Communication   Communication: HOH  Cognition Arousal/Alertness: Awake/alert Behavior During Therapy: WFL for tasks assessed/performed Overall Cognitive Status: Within Functional Limits for tasks assessed                                        General Comments  Exercises     Assessment/Plan    PT Assessment Patient needs continued PT services  PT Problem List Decreased strength;Decreased balance;Decreased activity tolerance;Decreased mobility;Pain;Decreased knowledge of precautions       PT Treatment Interventions DME instruction;Functional mobility training;Gait training;Therapeutic exercise;Balance training;Patient/family education;Therapeutic activities    PT Goals (Current goals can be found in the Care Plan section)  Acute Rehab PT Goals Patient Stated Goal: to regain PLOF PT Goal Formulation: With patient/family Time For Goal Achievement: 10/14/17 Potential to Achieve Goals: Good    Frequency  Min 3X/week   Barriers to discharge        Co-evaluation               AM-PAC PT "6 Clicks" Daily Activity  Outcome Measure Difficulty turning over in bed (including adjusting bedclothes, sheets and blankets)?: Unable Difficulty moving from lying on back to sitting on the side of the bed? : Unable Difficulty sitting down on and standing up from a chair with arms (e.g., wheelchair, bedside commode, etc,.)?: Unable Help needed moving to and from a bed to chair (including a wheelchair)?: A Lot Help needed walking in hospital room?: A Lot Help needed climbing 3-5 steps with a railing? : Total 6 Click Score: 8    End of Session Equipment Utilized During Treatment: Gait belt Activity Tolerance: Patient tolerated treatment well Patient left: in chair;with call bell/phone within reach;with family/visitor present;with chair alarm set   PT Visit Diagnosis: Muscle weakness (generalized) (M62.81);Difficulty in walking, not elsewhere classified (R26.2);Pain Pain - Right/Left: Left Pain - part of body: Leg    Time: 1347-1410 PT Time Calculation (min) (ACUTE ONLY): 23 min   Charges:   PT Evaluation $PT Eval Moderate Complexity: 1 Mod PT Treatments $Gait Training: 8-22 mins   PT G Codes:          Weston Anna, MPT Pager: [phone]

## 2017-09-30 NOTE — Progress Notes (Signed)
Patient very anxious about various things. Refuses to take pain/nausea medication despite feeling pain/nausea. Very concerned about medications despite numerous attempts of educating regarding medication compatibility. Repeatedly requests for the doctor to come "check him out." Encourage deep breathing exercises to assist with decreased level of anxiety.

## 2017-10-01 DIAGNOSIS — K922 Gastrointestinal hemorrhage, unspecified: Secondary | ICD-10-CM

## 2017-10-01 LAB — BASIC METABOLIC PANEL
Anion gap: 7 (ref 5–15)
BUN: 52 mg/dL — ABNORMAL HIGH (ref 6–20)
CO2: 24 mmol/L (ref 22–32)
CREATININE: 1.18 mg/dL (ref 0.61–1.24)
Calcium: 8.3 mg/dL — ABNORMAL LOW (ref 8.9–10.3)
Chloride: 99 mmol/L — ABNORMAL LOW (ref 101–111)
GFR calc Af Amer: 60 mL/min — ABNORMAL LOW (ref >60)
GFR calc non Af Amer: 52 mL/min — ABNORMAL LOW (ref >60)
GLUCOSE: 125 mg/dL — AB (ref 65–99)
Potassium: 5.1 mmol/L (ref 3.5–5.1)
Sodium: 130 mmol/L — ABNORMAL LOW (ref 135–145)

## 2017-10-01 LAB — CBC
HEMATOCRIT: 30.2 % — AB (ref 39.0–52.0)
Hemoglobin: 10.5 g/dL — ABNORMAL LOW (ref 13.0–17.0)
MCH: 33.2 pg (ref 26.0–34.0)
MCHC: 34.8 g/dL (ref 30.0–36.0)
MCV: 95.6 fL (ref 78.0–100.0)
Platelets: 172 10*3/uL (ref 150–400)
RBC: 3.16 MIL/uL — AB (ref 4.22–5.81)
RDW: 15 % (ref 11.5–15.5)
WBC: 14.6 10*3/uL — AB (ref 4.0–10.5)

## 2017-10-01 LAB — HEMOGLOBIN AND HEMATOCRIT, BLOOD
HCT: 26.7 % — ABNORMAL LOW (ref 39.0–52.0)
Hemoglobin: 9.3 g/dL — ABNORMAL LOW (ref 13.0–17.0)

## 2017-10-01 LAB — PROTIME-INR
INR: 2.39
Prothrombin Time: 25.9 seconds — ABNORMAL HIGH (ref 11.4–15.2)

## 2017-10-01 LAB — MAGNESIUM: Magnesium: 2 mg/dL (ref 1.7–2.4)

## 2017-10-01 MED ORDER — HYDROCODONE-ACETAMINOPHEN 5-325 MG PO TABS: 1.0000 | ORAL_TABLET | Freq: Four times a day (QID) | ORAL | 0 refills | Status: DC | PRN

## 2017-10-01 MED ORDER — DOCUSATE SODIUM 100 MG PO CAPS: 100.0000 mg | ORAL_CAPSULE | Freq: Two times a day (BID) | ORAL | 0 refills | Status: DC

## 2017-10-01 MED ORDER — METHOCARBAMOL 500 MG PO TABS: 500.0000 mg | ORAL_TABLET | Freq: Four times a day (QID) | ORAL | 0 refills | Status: DC | PRN

## 2017-10-01 MED ORDER — VITAMIN K1 10 MG/ML IJ SOLN
5.0000 mg | Freq: Once | INTRAVENOUS | Status: AC
  Administered 2017-10-01: 5 mg via INTRAVENOUS
  Filled 2017-10-01: qty 0.5

## 2017-10-01 MED ORDER — SODIUM CHLORIDE 0.9 % IV SOLN
INTRAVENOUS | Status: DC
  Administered 2017-10-01 – 2017-10-03 (×3): via INTRAVENOUS

## 2017-10-01 MED ORDER — POLYETHYLENE GLYCOL 3350 17 G PO PACK: 17.0000 g | PACK | Freq: Every day | ORAL | 0 refills | Status: DC | PRN

## 2017-10-01 MED ORDER — PANTOPRAZOLE SODIUM 40 MG IV SOLR
40.0000 mg | Freq: Two times a day (BID) | INTRAVENOUS | Status: DC
  Administered 2017-10-01 – 2017-10-04 (×7): 40 mg via INTRAVENOUS
  Filled 2017-10-01 (×7): qty 40

## 2017-10-01 MED ORDER — FERROUS SULFATE 325 (65 FE) MG PO TABS: 325.0000 mg | ORAL_TABLET | Freq: Three times a day (TID) | ORAL | 3 refills | Status: AC

## 2017-10-01 NOTE — Progress Notes (Signed)
Physical Therapy Treatment Patient Details Name: Joseangel Nettleton MRN: [id number] DOB: Dec 12, 1922 Today's Date: 10/01/2017    History of Present Illness 83 yo male admitted with L hip fx after sustaining a fall at home. S/P L hip hemi 09/29/17. Hx of A fib, RA, falls.     PT Comments    Progressing with mobility. Noted increased ER of L LE on today. Minimal pain with activity. Will continue to follow. Continue to recommend SNF.    Follow Up Recommendations  SNF     Equipment Recommendations  None recommended by PT    Recommendations for Other Services       Precautions / Restrictions Precautions Precautions: Posterior Hip;Fall Precaution Comments: Educated pt on posterior hip precautions. Pt able to recall 1/3.  Restrictions Weight Bearing Restrictions: No LLE Weight Bearing: Weight bearing as tolerated    Mobility  Bed Mobility Overal bed mobility: Needs Assistance Bed Mobility: Supine to Sit     Supine to sit: Min assist;HOB elevated     General bed mobility comments: Small amount of assist for L LE. Cues and assist to maintain L hip precautions.   Transfers Overall transfer level: Needs assistance Equipment used: Rolling walker (2 wheeled) Transfers: Sit to/from Stand Sit to Stand: Min assist;+2 safety/equipment;From elevated surface         General transfer comment: Assist to rise, stabilize, control descent. VCS safety, technique, hand/LE placement.   Ambulation/Gait Ambulation/Gait assistance: Min assist;+2 safety/equipment Ambulation Distance (Feet): 50 Feet Assistive device: Rolling walker (2 wheeled) Gait Pattern/deviations: Step-to pattern;Trunk flexed     General Gait Details: Assist to stabilize pt, maneuver safely with RW and to follow with the recliner. VCs safety, technique, sequence, distance from RW.   Stairs            Wheelchair Mobility    Modified Rankin (Stroke Patients Only)       Balance                                             Cognition Arousal/Alertness: Awake/alert Behavior During Therapy: WFL for tasks assessed/performed Overall Cognitive Status: Within Functional Limits for tasks assessed                                        Exercises      General Comments        Pertinent Vitals/Pain Pain Assessment: Faces Faces Pain Scale: Hurts little more Pain Location: L hip Pain Descriptors / Indicators: Sore Pain Intervention(s): Monitored during session;Repositioned    Home Living                      Prior Function            PT Goals (current goals can now be found in the care plan section) Progress towards PT goals: Progressing toward goals    Frequency    Min 3X/week      PT Plan Current plan remains appropriate    Co-evaluation              AM-PAC PT "6 Clicks" Daily Activity  Outcome Measure  Difficulty turning over in bed (including adjusting bedclothes, sheets and blankets)?: Unable Difficulty moving from lying on back to sitting on the side of the bed? :  Unable Difficulty sitting down on and standing up from a chair with arms (e.g., wheelchair, bedside commode, etc,.)?: Unable Help needed moving to and from a bed to chair (including a wheelchair)?: A Lot Help needed walking in hospital room?: A Lot Help needed climbing 3-5 steps with a railing? : Total 6 Click Score: 8    End of Session Equipment Utilized During Treatment: Gait belt Activity Tolerance: Patient tolerated treatment well Patient left: in chair;with call bell/phone within reach;with chair alarm set;with family/visitor present   PT Visit Diagnosis: Muscle weakness (generalized) (M62.81);Difficulty in walking, not elsewhere classified (R26.2) Pain - Right/Left: Left Pain - part of body: Leg     Time: 7858-8502 PT Time Calculation (min) (ACUTE ONLY): 24 min  Charges:  $Gait Training: 8-22 mins                    G Codes:         Weston Anna, MPT Pager: [phone]

## 2017-10-01 NOTE — Progress Notes (Signed)
ANTICOAGULATION CONSULT NOTE - Follow Up Consult  Pharmacy Consult for warfarin Indication: atrial fibrillation and VTE prophylaxis post-op   Allergies  Allergen Reactions  . Shellfish Allergy Anaphylaxis  . Sulfa Antibiotics Anaphylaxis    Patient Measurements: Height: 5\' 10"  (177.8 cm) Weight: 181 lb 3.5 oz (82.2 kg) IBW/kg (Calculated) : 73  Vital Signs: Temp: 97.8 F (36.6 C) (11/20 0452) Temp Source: Oral (11/20 0452) BP: 142/96 (11/20 0452) Pulse Rate: 79 (11/20 0452)  Labs: Recent Labs    09/29/17 0555 09/30/17 0514 10/01/17 0441  HGB 11.8* 11.3* 10.5*  HCT 34.4* 33.2* 30.2*  PLT 180 187 172  LABPROT 16.5* 16.2* 25.9*  INR 1.34 1.31 2.39  CREATININE 1.04 1.19 1.18    Estimated Creatinine Clearance: 41.2 mL/min (by C-G formula based on SCr of 1.18 mg/dL).   Medications:  PTA warfarin 2 mg daily except 4 mg on Wednesday.  Assessment: 83 y.o. Male with history of Afib on warfarin admitted 11/17 with hip fx. INR was therapeutic on admission at 2.26 and reversed with Vit K IV 5 mg prior to surgery. He is now s/p surgical repair on 11/18. Pharmacy consulted to resume warfarin  10/01/2017 - INR therapeutic but increased significantly from 1.31 to 2.39 following two doses of 4 mg on 11/18 and 11/19.  - Hgb and plt stable - Per RN, patient spitting up emesis like substance that is slightly coffee ground/watery brown. No other signs or symptoms of bleeding documented.  - Diet: per charting, few meals eaten.  - Drug interactions: PTA keflex restarted on 11/19. Antibiotics may make patients more sensitive to warfarin.   Goal of Therapy:  INR 2-3 Monitor platelets by anticoagulation protocol: Yes   Plan:  - hold warfarin due to sharp rise in INR  - Discontinue enoxaparin 40 mg daily now that INR is 2-3 - Daily INR - Monitor CBC - Monitor signs and symptoms of bleeding   Richmond Campbell  PharmD Candidate 10/01/2017,7:25 AM

## 2017-10-01 NOTE — Progress Notes (Addendum)
Patient ID: [name], male   DOB: Oct 29, 1923, 83 y.o.   MRN: [id number]  PROGRESS NOTE    [name]  FYB:[id number] DOB: 1923/08/04 DOA: 09/28/2017 PCP: Merrilee Seashore, MD   Brief Narrative:  83 year old male with history of atrial fibrillation on anticoagulation presented with a fall and left leg pain.  He was found to have left hip fracture.  Orthopedics was consulted.   Assessment & Plan:   Active Problems:   A-fib (HCC)   Closed left hip fracture (HCC)  Probable upper GI bleeding causing coffee-ground emesis -Hold Coumadin; INR is therapeutic today; give vitamin K 5 mg IV x1.  Monitor H&H.  Protonix 40 mg IV every 12 hours.  GI consulted.  N.p.o. for now  Closed left hip fracture after probably a mechanical fall -Status post surgery on 09/29/2017.  Orthopedics following. Continue pain management.  Fall precautions. -PT/OT eval.  Activity and wound care as per orthopedics recommendations  Chronic atrial fibrillation/flutter - Continue with Cardizem and metoprolol.  Currently rate controlled -Hold Coumadin as above  Leukocytosis -Probably reactive.  Repeat a.m. labs  Mild hyponatremia -Repeat a.m. labs    DVT prophylaxis: Coumadin  code Status: DNR Family Communication: Spoke to son at bedside Disposition Plan: Probable nursing home in 1-2 days  Consultants: Orthopedic GI consulted today  Procedures: Left hip hemiarthroplasty on 09/29/2017  Antimicrobials: Patient has been on Keflex as an outpatient   Subjective: Patient seen and examined at bedside.  He complains of intermittent hiccups with nausea and vomiting.  Denies abdominal pain.  No overnight fever   Objective: Vitals:   09/30/17 1500 09/30/17 2104 10/01/17 0012 10/01/17 0452  BP: 121/75 (!) 132/93 (!) 142/85 (!) 142/96  Pulse: (!) 107 78 94 79  Resp: 18 18 18 18   Temp: 98.2 F (36.8 C) 98 F (36.7 C) 98 F (36.7 C) 97.8 F (36.6 C)  TempSrc: Oral Axillary Oral Oral  SpO2: 97% 97%  97% 99%  Weight:      Height:        Intake/Output Summary (Last 24 hours) at 10/01/2017 0953 Last data filed at 10/01/2017 0850 Gross per 24 hour  Intake 930.5 ml  Output 1026 ml  Net -95.5 ml   Filed Weights   09/28/17 1022 09/28/17 1801  Weight: 82.6 kg (182 lb) 82.2 kg (181 lb 3.5 oz)    Examination:  General exam: Appears calm and comfortable; elderly male lying in bed Respiratory system: Bilateral decreased breath sound at bases Cardiovascular system: S1 & S2 heard, currently rate controlled gastrointestinal system: Abdomen is nondistended, soft and nontender. Normal bowel sounds heard. Extremities: No cyanosis, clubbing, edema    Data Reviewed: I have personally reviewed following labs and imaging studies  CBC: Recent Labs  Lab 09/28/17 1340 09/29/17 0555 09/30/17 0514 10/01/17 0441  WBC 13.8* 7.8 12.4* 14.6*  NEUTROABS 11.9*  --  11.1*  --   HGB 12.2* 11.8* 11.3* 10.5*  HCT 35.1* 34.4* 33.2* 30.2*  MCV 95.6 96.6 96.2 95.6  PLT 194 180 187 527   Basic Metabolic Panel: Recent Labs  Lab 09/28/17 1340 09/29/17 0555 09/30/17 0514 10/01/17 0441  NA 132* 131* 130* 130*  K 4.2 4.7 4.1 5.1  CL 101 102 98* 99*  CO2 24 24 22 24   GLUCOSE 117* 121* 171* 125*  BUN 17 16 26* 52*  CREATININE 1.01 1.04 1.19 1.18  CALCIUM 8.8* 8.4* 8.6* 8.3*  MG  --   --  1.8 2.0   GFR: Estimated  Creatinine Clearance: 41.2 mL/min (by C-G formula based on SCr of 1.18 mg/dL). Liver Function Tests: No results for input(s): AST, ALT, ALKPHOS, BILITOT, PROT, ALBUMIN in the last 168 hours. No results for input(s): LIPASE, AMYLASE in the last 168 hours. No results for input(s): AMMONIA in the last 168 hours. Coagulation Profile: Recent Labs  Lab 09/28/17 1340 09/29/17 0555 09/30/17 0514 10/01/17 0441  INR 2.26 1.34 1.31 2.39   Cardiac Enzymes: No results for input(s): CKTOTAL, CKMB, CKMBINDEX, TROPONINI in the last 168 hours. BNP (last 3 results) No results for input(s):  PROBNP in the last 8760 hours. HbA1C: No results for input(s): HGBA1C in the last 72 hours. CBG: No results for input(s): GLUCAP in the last 168 hours. Lipid Profile: No results for input(s): CHOL, HDL, LDLCALC, TRIG, CHOLHDL, LDLDIRECT in the last 72 hours. Thyroid Function Tests: No results for input(s): TSH, T4TOTAL, FREET4, T3FREE, THYROIDAB in the last 72 hours. Anemia Panel: No results for input(s): VITAMINB12, FOLATE, FERRITIN, TIBC, IRON, RETICCTPCT in the last 72 hours. Sepsis Labs: No results for input(s): PROCALCITON, LATICACIDVEN in the last 168 hours.  Recent Results (from the past 240 hour(s))  Surgical pcr screen     Status: None   Collection Time: 09/28/17  5:00 PM  Result Value Ref Range Status   MRSA, PCR NEGATIVE NEGATIVE Final   Staphylococcus aureus NEGATIVE NEGATIVE Final    Comment: (NOTE) The Xpert SA Assay (FDA approved for NASAL specimens in patients 19 years of age and older), is one component of a comprehensive surveillance program. It is not intended to diagnose infection nor to guide or monitor treatment.          Radiology Studies: Pelvis Portable  Result Date: 09/29/2017 CLINICAL DATA:  Status post left hip replacement. EXAM: PORTABLE PELVIS 1-2 VIEWS COMPARISON:  09/18/2017 FINDINGS: Interval left hip hemiarthroplasty. No evidence for immediate hardware complication. Gas in the soft tissues compatible with the recent surgery. Bones diffusely demineralized. IMPRESSION: Status post left hip hemiarthroplasty without evidence for immediate hardware complications. Electronically Signed   By: Misty Stanley M.D.   On: 09/29/2017 15:17        Scheduled Meds: . cephALEXin  500 mg Oral TID  . diltiazem (CARDIZEM CD) 24 hr capsule 360 mg  360 mg Oral Daily  . docusate sodium  100 mg Oral BID  . feeding supplement (ENSURE ENLIVE)  237 mL Oral BID BM  . ferrous sulfate  325 mg Oral TID PC  . metoprolol succinate  50 mg Oral Daily  . pantoprazole  (PROTONIX) IV  40 mg Intravenous BID  . Warfarin - Pharmacist Dosing Inpatient   Does not apply q1800   Continuous Infusions: . sodium chloride Stopped (09/30/17 2038)  . methocarbamol (ROBAXIN)  IV    . phytonadione (VITAMIN K) IV       LOS: 3 days        Aline August, MD [facility] Pager [phone]  If 7PM-7AM, please contact night-coverage [email or web address] Password [facility] 10/01/2017, 9:53 AM

## 2017-10-01 NOTE — Consult Note (Signed)
Subjective:   HPI  The patient is a 83 year old male with a history of atrial fibrillation on Coumadin who had surgery on November 18 for a left hip fracture. We were asked to see him in regards to coffee-ground emesis which began yesterday evening. He has a history of erosive distal esophagitis and a distal esophageal stricture diagnosed in 2016 by endoscopy. The stricture was dilated in 2016 with improvement of symptoms of dysphagia which according to him have not worsened since the dilatation. He states that after the hip surgery he started noticing heartburn and started having the coffee-ground emesis. His Coumadin was held. He has been started on Protonix.  Review of Systems Denies chest pain or shortness of breath  Past Medical History:  Diagnosis Date  . A-fib ([place])   . Dysrhythmia    Atrial Tachycardia with block  . Erosive esophagitis   . GERD (gastroesophageal reflux disease)   . H/O fracture of rib    Left  5th, 6th, 7th had a fall in APRIL 2016  . Hypertension   . RA (rheumatoid arthritis) ([place])    Past Surgical History:  Procedure Laterality Date  . BALLOON DILATION N/A 05/04/2015   Procedure: BALLOON DILATION;  Surgeon: Wilford Corner, MD;  Location: [facility] ENDOSCOPY;  Service: Endoscopy;  Laterality: N/A;  janie/melissa  . CATARACT EXTRACTION    . ESOPHAGOGASTRODUODENOSCOPY N/A 03/12/2015   Procedure: ESOPHAGOGASTRODUODENOSCOPY (EGD);  Surgeon: Wilford Corner, MD;  Location: [facility] ENDOSCOPY;  Service: Endoscopy;  Laterality: N/A;  . ESOPHAGOGASTRODUODENOSCOPY N/A 05/04/2015   Procedure: ESOPHAGOGASTRODUODENOSCOPY (EGD);  Surgeon: Wilford Corner, MD;  Location[facility] ENDOSCOPY;  Service: Endoscopy;  Laterality: N/A;  carm  . EYE SURGERY Bilateral 10/2015  . HIP ARTHROPLASTY Left 09/29/2017   Procedure: ARTHROPLASTY  HIP HEMIARTHROPLASTY left;  Surgeon: Paralee Cancel, MD;  Location: [facility] ORS;  Service: Orthopedics;  Laterality: Left;  . SAVORY DILATION N/A 05/04/2015   Procedure:  SAVORY DILATION;  Surgeon: Wilford Corner, MD;  Location: [facility] ENDOSCOPY;  Service: Endoscopy;  Laterality: N/A;   Social History   Socioeconomic History  . Marital status: Married    Spouse name: Not on file  . Number of children: Not on file  . Years of education: Not on file  . Highest education level: Not on file  Social Needs  . Financial resource strain: Not on file  . Food insecurity - worry: Not on file  . Food insecurity - inability: Not on file  . Transportation needs - medical: Not on file  . Transportation needs - non-medical: Not on file  Occupational History  . Not on file  Tobacco Use  . Smoking status: Light Tobacco Smoker    Types: Cigars  . Smokeless tobacco: Never Used  Substance and Sexual Activity  . Alcohol use: No    Comment: April 21, 2005 last drink  . Drug use: No  . Sexual activity: Not on file  Other Topics Concern  . Not on file  Social History Narrative  . Not on file   family history includes Stroke in his mother.  Current Facility-Administered Medications:  .  0.9 %  sodium chloride infusion, , Intravenous, Continuous, Bonnielee Haff, MD, Stopped at 09/30/17 2038 .  0.9 %  sodium chloride infusion, , Intravenous, Continuous, Alekh, Kshitiz, MD, Last Rate: 75 mL/hr at 10/01/17 1122 .  alum & mag hydroxide-simeth (MAALOX/MYLANTA) 200-200-20 MG/5ML suspension 30 mL, 30 mL, Oral, Q6H PRN, Schorr, Rhetta Mura, NP, 30 mL at 10/01/17 0801 .  bisacodyl (DULCOLAX) suppository 10  mg, 10 mg, Rectal, Daily PRN, Babish, Matthew, PA-C .  cephALEXin (KEFLEX) capsule 500 mg, 500 mg, Oral, TID, Starla Link, Kshitiz, MD, 500 mg at 10/01/17 1005 .  diltiazem (CARDIZEM CD) 24 hr capsule 360 mg, 360 mg, Oral, Daily, Bonnielee Haff, MD, 360 mg at 10/01/17 1002 .  docusate sodium (COLACE) capsule 100 mg, 100 mg, Oral, BID, Bonnielee Haff, MD, 100 mg at 10/01/17 1007 .  feeding supplement (ENSURE ENLIVE) (ENSURE ENLIVE) liquid 237 mL, 237 mL, Oral, BID BM, Alekh,  Kshitiz, MD, 237 mL at 09/30/17 1600 .  ferrous sulfate tablet 325 mg, 325 mg, Oral, TID PC, Danae Orleans, PA-C, 325 mg at 09/30/17 1707 .  HYDROcodone-acetaminophen (NORCO/VICODIN) 5-325 MG per tablet 1-2 tablet, 1-2 tablet, Oral, Q6H PRN, Bonnielee Haff, MD, 1 tablet at 09/30/17 1343 .  magnesium citrate solution 1 Bottle, 1 Bottle, Oral, Once PRN, Babish, Matthew, PA-C .  menthol-cetylpyridinium (CEPACOL) lozenge 3 mg, 1 lozenge, Oral, PRN **OR** phenol (CHLORASEPTIC) mouth spray 1 spray, 1 spray, Mouth/Throat, PRN, Babish, Matthew, PA-C .  methocarbamol (ROBAXIN) tablet 500 mg, 500 mg, Oral, Q6H PRN **OR** methocarbamol (ROBAXIN) 500 mg in dextrose 5 % 50 mL IVPB, 500 mg, Intravenous, Q6H PRN, Bonnielee Haff, MD .  metoCLOPramide (REGLAN) tablet 5-10 mg, 5-10 mg, Oral, Q8H PRN **OR** metoCLOPramide (REGLAN) injection 5-10 mg, 5-10 mg, Intravenous, Q8H PRN, Babish, Matthew, PA-C .  metoprolol succinate (TOPROL-XL) 24 hr tablet 50 mg, 50 mg, Oral, Daily, Bonnielee Haff, MD, 50 mg at 10/01/17 1003 .  morphine 4 MG/ML injection 0.52 mg, 0.52 mg, Intravenous, Q2H PRN, Bonnielee Haff, MD, 0.52 mg at 09/28/17 1845 .  ondansetron (ZOFRAN) tablet 4 mg, 4 mg, Oral, Q6H PRN **OR** ondansetron (ZOFRAN) injection 4 mg, 4 mg, Intravenous, Q6H PRN, Babish, Matthew, PA-C, 4 mg at 10/01/17 0809 .  pantoprazole (PROTONIX) injection 40 mg, 40 mg, Intravenous, BID, Alekh, Kshitiz, MD, 40 mg at 10/01/17 1002 .  phytonadione (VITAMIN K) 5 mg in dextrose 5 % 50 mL IVPB, 5 mg, Intravenous, Once, Alekh, Kshitiz, MD, Last Rate: 50 mL/hr at 10/01/17 1122, 5 mg at 10/01/17 1122 .  polyethylene glycol (MIRALAX / GLYCOLAX) packet 17 g, 17 g, Oral, Daily PRN, Danae Orleans, PA-C .  Warfarin - Pharmacist Dosing Inpatient, , Does not apply, q1800, Thomes Lolling, [facility] Allergies  Allergen Reactions  . Shellfish Allergy Anaphylaxis  . Sulfa Antibiotics Anaphylaxis     Objective:     BP (!) 133/101 (BP Location:  Right Arm)   Pulse 79   Temp 98.1 F (36.7 C) (Oral)   Resp 18   Ht 5\' 10"  (1.778 m)   Wt 82.2 kg (181 lb 3.5 oz)   SpO2 98%   BMI 26.00 kg/m   No distress  Heart irregular  Lungs clear  Abdomen soft and nontender  Laboratory No components found for: D1    Assessment:     Coffee-ground emesis most probably secondary to erosive esophagitis which has been diagnosed in the past.      Plan:     I discussed the situation with the patient and his wife. We have elected to treat him medically with PPI. I doubt that a diagnostic endoscopy would add much if anything at this point. I don't think there is anything therapeutically that we would do endoscopically at this time. The patient and wife do not wish endoscopy either. Should worsening hematemesis occur and we needed to do something therapeutically endoscopically we could, but at this time we will  treat medically. Agree with holding Coumadin for a few days until the situation is stable. Lab Results  Component Value Date   HGB 10.5 (L) 10/01/2017   HGB 11.3 (L) 09/30/2017   HGB 11.8 (L) 09/29/2017   HCT 30.2 (L) 10/01/2017   HCT 33.2 (L) 09/30/2017   HCT 34.4 (L) 09/29/2017   ALKPHOS 74 05/03/2011   ALKPHOS 84 02/08/2008   AST 12 05/03/2011   AST 17 02/08/2008   ALT 10 05/03/2011   ALT 14 02/08/2008

## 2017-10-01 NOTE — Progress Notes (Signed)
CSW following to assist with planning transition to SNF at [place]. Provided pt/wife with bed offers and they prefer [facility], [facility], or [facility]. As of yet no offers from these facilities, so CSW reached out to admissions inquiring about pt's referral and if any bed availability is expected in upcoming days. Will follow up with pt/family.  Sharren Bridge, MSW, LCSW Clinical Social Work 10/01/2017 [phone]

## 2017-10-01 NOTE — Evaluation (Signed)
Occupational Therapy Evaluation Patient Details Name: [name] MRN: [id number] DOB: Feb 22, 1923 Today's Date: 10/01/2017    History of Present Illness 83 yo male admitted with L hip fx after sustaining a fall at home. S/P L hip hemi 09/29/17. Hx of A fib, RA, falls.    Clinical Impression   Pt typically functions modified independently in ADL and mobility and lives with his wife of 33 years. Pt presents with postoperative hip pain, unsteady gait and generalized weakness. He has been having episodes of vomiting, complicating hospital course. Pt will need post acute rehab, prior to return home with his wife . Will follow acutely.    Follow Up Recommendations  SNF;Supervision/Assistance - 24 hour    Equipment Recommendations       Recommendations for Other Services       Precautions / Restrictions Precautions Precautions: Posterior Hip;Fall Precaution Comments: Educated pt on posterior hip precautions. Pt able to recall 1/3.  Restrictions Weight Bearing Restrictions: No LLE Weight Bearing: Weight bearing as tolerated      Mobility Bed Mobility Overal bed mobility: Needs Assistance Bed Mobility: Supine to Sit     Supine to sit: Min assist;HOB elevated     General bed mobility comments: Small amount of assist for L LE. Cues and assist to maintain L hip precautions.   Transfers Overall transfer level: Needs assistance Equipment used: Rolling walker (2 wheeled) Transfers: Sit to/from Stand Sit to Stand: Min assist;+2 safety/equipment;From elevated surface         General transfer comment: Assist to rise, stabilize, control descent. VCS safety, technique, hand/LE placement.     Balance                                           ADL either performed or assessed with clinical judgement   ADL Overall ADL's : Needs assistance/impaired Eating/Feeding: Independent;Sitting(currently NPO due to vomiting blood)   Grooming: Set up;Sitting   Upper  Body Bathing: Minimal assistance;Sitting   Lower Body Bathing: Total assistance;Sit to/from stand   Upper Body Dressing : Set up;Sitting   Lower Body Dressing: Maximal assistance;Sit to/from stand   Toilet Transfer: Minimal assistance;RW;Ambulation;BSC   Toileting- Clothing Manipulation and Hygiene: Maximal assistance;Sit to/from stand       Functional mobility during ADLs: Minimal assistance;+2 for safety/equipment;Rolling walker;Cueing for safety General ADL Comments: pt able to state 1/3 hip precautions     Vision Baseline Vision/History: Wears glasses Wears Glasses: Reading only Patient Visual Report: No change from baseline       Perception     Praxis      Pertinent Vitals/Pain Pain Assessment: Faces Faces Pain Scale: Hurts little more Pain Location: L hip Pain Descriptors / Indicators: Sore Pain Intervention(s): Monitored during session     Hand Dominance Right   Extremity/Trunk Assessment Upper Extremity Assessment Upper Extremity Assessment: Overall WFL for tasks assessed   Lower Extremity Assessment Lower Extremity Assessment: Defer to PT evaluation   Cervical / Trunk Assessment Cervical / Trunk Assessment: Kyphotic   Communication Communication Communication: HOH   Cognition Arousal/Alertness: Awake/alert Behavior During Therapy: WFL for tasks assessed/performed Overall Cognitive Status: Within Functional Limits for tasks assessed                                     General Comments  Exercises     Shoulder Instructions      Home Living Family/patient expects to be discharged to:: Skilled nursing facility Living Arrangements: Spouse/significant other                                      Prior Functioning/Environment Level of Independence: Independent with assistive device(s)        Comments: cane for community. RW at night        OT Problem List: Decreased strength;Decreased activity  tolerance;Impaired balance (sitting and/or standing);Decreased knowledge of use of DME or AE;Decreased knowledge of precautions;Pain      OT Treatment/Interventions: Self-care/ADL training;DME and/or AE instruction;Therapeutic activities;Patient/family education;Balance training    OT Goals(Current goals can be found in the care plan section) Acute Rehab OT Goals Patient Stated Goal: to regain PLOF OT Goal Formulation: With patient Time For Goal Achievement: 10/15/17 Potential to Achieve Goals: Good ADL Goals Pt Will Perform Grooming: with supervision;standing Pt Will Perform Lower Body Bathing: with supervision;with adaptive equipment;sit to/from stand Pt Will Perform Lower Body Dressing: with supervision;with adaptive equipment;sit to/from stand Pt Will Transfer to Toilet: with supervision;ambulating;bedside commode Pt Will Perform Toileting - Clothing Manipulation and hygiene: with supervision;sit to/from stand Additional ADL Goal #1: Pt will generalize posterior hip precautions in ADL with supervision.  OT Frequency: Min 2X/week   Barriers to D/C:            Co-evaluation PT/OT/SLP Co-Evaluation/Treatment: Yes Reason for Co-Treatment: For patient/therapist safety   OT goals addressed during session: ADL's and self-care      AM-PAC PT "6 Clicks" Daily Activity     Outcome Measure Help from another person eating meals?: None Help from another person taking care of personal grooming?: A Little Help from another person toileting, which includes using toliet, bedpan, or urinal?: A Lot Help from another person bathing (including washing, rinsing, drying)?: A Lot Help from another person to put on and taking off regular upper body clothing?: A Little Help from another person to put on and taking off regular lower body clothing?: A Lot 6 Click Score: 16   End of Session Equipment Utilized During Treatment: Gait belt;Rolling walker Nurse Communication: Mobility  status(NPO)  Activity Tolerance: Patient tolerated treatment well Patient left: in chair;with call bell/phone within reach;with chair alarm set;with family/visitor present  OT Visit Diagnosis: Unsteadiness on feet (R26.81);Other abnormalities of gait and mobility (R26.89);Muscle weakness (generalized) (M62.81);Pain;History of falling (Z91.81)                Time: 7253-6644 OT Time Calculation (min): 25 min Charges:  OT General Charges $OT Visit: 1 Visit OT Evaluation $OT Eval Moderate Complexity: 1 Mod G-Codes:     Malka So 10/01/2017, 11:13 AM  10/01/2017 Nestor Lewandowsky, OTR/L Pager: [phone]

## 2017-10-01 NOTE — Progress Notes (Signed)
Patient ID: [name], male   DOB: Jan 31, 1923, 84 y.o.   MRN: [id number] Subjective: 2 Days Post-Op Procedure(s) (LRB): ARTHROPLASTY  HIP HEMIARTHROPLASTY left (Left)    Patient reports pain as mild with regards to the hip. He has been challenged most recently with some esophogeal bleeding which is being worked up and followed. He did ambulate in hall with minimal left hip pain.  Objective:   VITALS:   Vitals:   10/01/17 0955 10/01/17 1319  BP: (!) 133/101 (!) 143/94  Pulse:    Resp: 18 18  Temp: 98.1 F (36.7 C) 98.2 F (36.8 C)  SpO2: 98% 90%    Neurovascular intact Incision: dressing C/D/I  LABS Recent Labs    09/29/17 0555 09/30/17 0514 10/01/17 0441  HGB 11.8* 11.3* 10.5*  HCT 34.4* 33.2* 30.2*  WBC 7.8 12.4* 14.6*  PLT 180 187 172    Recent Labs    09/29/17 0555 09/30/17 0514 10/01/17 0441  NA 131* 130* 130*  K 4.7 4.1 5.1  BUN 16 26* 52*  CREATININE 1.04 1.19 1.18  GLUCOSE 121* 171* 125*    Recent Labs    09/30/17 0514 10/01/17 0441  INR 1.31 2.39     Assessment/Plan: 2 Days Post-Op Procedure(s) (LRB): ARTHROPLASTY  HIP HEMIARTHROPLASTY left (Left)   Up with therapy Discharge to SNF when cleared medically RTC in 2 weeks Coumadin per medicine WBAT LLE

## 2017-10-02 DIAGNOSIS — K2210 Ulcer of esophagus without bleeding: Secondary | ICD-10-CM

## 2017-10-02 LAB — BASIC METABOLIC PANEL
Anion gap: 6 (ref 5–15)
BUN: 39 mg/dL — ABNORMAL HIGH (ref 6–20)
CALCIUM: 8 mg/dL — AB (ref 8.9–10.3)
CO2: 22 mmol/L (ref 22–32)
Chloride: 105 mmol/L (ref 101–111)
Creatinine, Ser: 1.01 mg/dL (ref 0.61–1.24)
GFR calc Af Amer: >60 mL/min (ref >60)
GLUCOSE: 96 mg/dL (ref 65–99)
POTASSIUM: 4.3 mmol/L (ref 3.5–5.1)
SODIUM: 133 mmol/L — AB (ref 135–145)

## 2017-10-02 LAB — PROTIME-INR
INR: 1.25
PROTHROMBIN TIME: 15.6 s — AB (ref 11.4–15.2)

## 2017-10-02 LAB — CBC
HCT: 27 % — ABNORMAL LOW (ref 39.0–52.0)
Hemoglobin: 9.3 g/dL — ABNORMAL LOW (ref 13.0–17.0)
MCH: 33 pg (ref 26.0–34.0)
MCHC: 34.4 g/dL (ref 30.0–36.0)
MCV: 95.7 fL (ref 78.0–100.0)
PLATELETS: 153 10*3/uL (ref 150–400)
RBC: 2.82 MIL/uL — AB (ref 4.22–5.81)
RDW: 15 % (ref 11.5–15.5)
WBC: 6.2 10*3/uL (ref 4.0–10.5)

## 2017-10-02 LAB — MAGNESIUM: MAGNESIUM: 2.1 mg/dL (ref 1.7–2.4)

## 2017-10-02 NOTE — Plan of Care (Signed)
  Clinical Measurements: Will remain free from infection 10/02/2017 2130 - Progressing by Ashley Murrain, RN   Pain Managment: General experience of comfort will improve 10/02/2017 2130 - Progressing by Ashley Murrain, RN   Activity: Ability to ambulate and perform ADLs will improve 10/02/2017 2130 - Progressing by Marlena Barbato, Sherryll Burger, RN

## 2017-10-02 NOTE — Progress Notes (Signed)
Pt, wife, and son select [facility] for rehab. CSW selected facility in the [place] and notified admissions. Will follow and assist with transition there at DC.   Sharren Bridge, MSW, LCSW Clinical Social Work 10/02/2017 [phone]

## 2017-10-02 NOTE — Progress Notes (Signed)
Patient ID: [name], male   DOB: Nov 20, 1922, 83 y.o.   MRN: [id number]  PROGRESS NOTE    Tatem Fesler  EYC:[id number] DOB: 02-10-1923 DOA: 09/28/2017 PCP: Merrilee Seashore, MD   Brief Narrative:  83 year old male with history of atrial fibrillation on anticoagulation presented with a fall and left leg pain.  He was found to have left hip fracture repaired with arthroplasty 11/18. On 11/19 he developed coffee-ground emesis for which GI was consulted. EGD was discussed with the patient and family who opted for conservative management with PPI, holding coumadin, and observation. .   Assessment & Plan:   Active Problems:   A-fib (HCC)   Closed left hip fracture (HCC)  Upper GI bleeding, suspect erosive esophagitis:  - Continue PPI - Hold coumadin, plan to restart in several days after bleeding reliably subsides. - Advance to liquids today, DC once hemostatic with advanced diet.  Closed left hip fracture due to mechanical fall: s/p left THA 09/29/2017. - WBAT LLE - Continue PT/OT, DC to SNF as above. - Follow up with orthopedics as outpatient in 2 weeks.   Chronic atrial fibrillation/flutter - Continue with Cardizem and metoprolol.  Currently rate controlled. Telemetry reviewed.  - Hold Coumadin as above  Leukocytosis: Resolved.  Mild hyponatremia: Improving.  - Monitor  DVT prophylaxis: SCDs code Status: DNR Family Communication: Wife at bedside Disposition Plan: SNF in 1 - 2 days  Consultants:  Orthopedics GI  Procedures: Left hip hemiarthroplasty on 09/29/2017  Antimicrobials: Patient has been on Keflex as an outpatient  Subjective: Feels better today, no emesis since yesterday morning. Ready to try to eat something. No chest pain, abdominal pain, N/V/D. Left hip pain is controlled.   Objective: Vitals:   10/02/17 0509 10/02/17 1042 10/02/17 1416 10/02/17 1535  BP: (!) 133/97 116/70 97/64   Pulse: (!) 117 91 (!) 125 (!) 113  Resp: 16 15    Temp: 97.8 F  (36.6 C) 98 F (36.7 C) 98.6 F (37 C)   TempSrc: Oral Oral Oral   SpO2: 99% 99% 100%   Weight:      Height:        Intake/Output Summary (Last 24 hours) at 10/02/2017 1656 Last data filed at 10/02/2017 0900 Gross per 24 hour  Intake 1397.5 ml  Output 1100 ml  Net 297.5 ml   Filed Weights   09/28/17 1022 09/28/17 1801  Weight: 82.6 kg (182 lb) 82.2 kg (181 lb 3.5 oz)    Examination:  General exam: Pleasant elderly male in bed in no distress Respiratory system: Diminished but clear, nonlabored.  Cardiovascular system: Irreg irreg, no JVD.  gastrointestinal system: Abdomen is nondistended, soft and nontender. Normal bowel sounds heard. Extremities: Left thigh compartment soft, incision dressing c/d/i, full ROM brisk cap refill. Sensation and strength intact distally.   Data Reviewed: I have personally reviewed following labs and imaging studies  CBC: Recent Labs  Lab 09/28/17 1340 09/29/17 0555 09/30/17 0514 10/01/17 0441 10/01/17 1750 10/02/17 0536  WBC 13.8* 7.8 12.4* 14.6*  --  6.2  NEUTROABS 11.9*  --  11.1*  --   --   --   HGB 12.2* 11.8* 11.3* 10.5* 9.3* 9.3*  HCT 35.1* 34.4* 33.2* 30.2* 26.7* 27.0*  MCV 95.6 96.6 96.2 95.6  --  95.7  PLT 194 180 187 172  --  149   Basic Metabolic Panel: Recent Labs  Lab 09/28/17 1340 09/29/17 0555 09/30/17 0514 10/01/17 0441 10/02/17 0536  NA 132* 131* 130* 130* 133*  K 4.2 4.7 4.1 5.1 4.3  CL 101 102 98* 99* 105  CO2 24 24 22 24 22   GLUCOSE 117* 121* 171* 125* 96  BUN 17 16 26* 52* 39*  CREATININE 1.01 1.04 1.19 1.18 1.01  CALCIUM 8.8* 8.4* 8.6* 8.3* 8.0*  MG  --   --  1.8 2.0 2.1   GFR: Estimated Creatinine Clearance: 48.2 mL/min (by C-G formula based on SCr of 1.01 mg/dL). Liver Function Tests: No results for input(s): AST, ALT, ALKPHOS, BILITOT, PROT, ALBUMIN in the last 168 hours. No results for input(s): LIPASE, AMYLASE in the last 168 hours. No results for input(s): AMMONIA in the last 168  hours. Coagulation Profile: Recent Labs  Lab 09/28/17 1340 09/29/17 0555 09/30/17 0514 10/01/17 0441 10/02/17 0536  INR 2.26 1.34 1.31 2.39 1.25   Cardiac Enzymes: No results for input(s): CKTOTAL, CKMB, CKMBINDEX, TROPONINI in the last 168 hours. BNP (last 3 results) No results for input(s): PROBNP in the last 8760 hours. HbA1C: No results for input(s): HGBA1C in the last 72 hours. CBG: No results for input(s): GLUCAP in the last 168 hours. Lipid Profile: No results for input(s): CHOL, HDL, LDLCALC, TRIG, CHOLHDL, LDLDIRECT in the last 72 hours. Thyroid Function Tests: No results for input(s): TSH, T4TOTAL, FREET4, T3FREE, THYROIDAB in the last 72 hours. Anemia Panel: No results for input(s): VITAMINB12, FOLATE, FERRITIN, TIBC, IRON, RETICCTPCT in the last 72 hours. Sepsis Labs: No results for input(s): PROCALCITON, LATICACIDVEN in the last 168 hours.  Recent Results (from the past 240 hour(s))  Surgical pcr screen     Status: None   Collection Time: 09/28/17  5:00 PM  Result Value Ref Range Status   MRSA, PCR NEGATIVE NEGATIVE Final   Staphylococcus aureus NEGATIVE NEGATIVE Final    Comment: (NOTE) The Xpert SA Assay (FDA approved for NASAL specimens in patients 47 years of age and older), is one component of a comprehensive surveillance program. It is not intended to diagnose infection nor to guide or monitor treatment.          Radiology Studies: No results found.      Scheduled Meds: . diltiazem (CARDIZEM CD) 24 hr capsule 360 mg  360 mg Oral Daily  . docusate sodium  100 mg Oral BID  . feeding supplement (ENSURE ENLIVE)  237 mL Oral BID BM  . ferrous sulfate  325 mg Oral TID PC  . metoprolol succinate  50 mg Oral Daily  . pantoprazole (PROTONIX) IV  40 mg Intravenous BID   Continuous Infusions: . sodium chloride Stopped (09/30/17 2038)  . sodium chloride 75 mL/hr at 10/02/17 1447  . methocarbamol (ROBAXIN)  IV       LOS: 4 days   Vance Gather,  MD [facility] Pager [phone]  If 7PM-7AM, please contact night-coverage [email or web address] Password [email or web address] 10/02/2017, 4:55 PM

## 2017-10-02 NOTE — Progress Notes (Signed)
[facility] Progress Note  Subjective: The patient feels good. No complaints of heartburn. No complaints of further coffee-ground emesis.  Objective: Vital signs in last 24 hours: Temp:  [97.8 F (36.6 C)-99.7 F (37.6 C)] 97.8 F (36.6 C) (11/21 0509) Pulse Rate:  [88-119] 117 (11/21 0509) Resp:  [16-18] 16 (11/21 0509) BP: (117-143)/(69-97) 133/97 (11/21 0509) SpO2:  [90 %-99 %] 99 % (11/21 0509) Weight change:    PE:  No distress  Abdomen soft and nontender  Lab Results: Results for orders placed or performed during the hospital encounter of 09/28/17 (from the past 24 hour(s))  Hemoglobin and hematocrit, blood     Status: Abnormal   Collection Time: 10/01/17  5:50 PM  Result Value Ref Range   Hemoglobin 9.3 (L) 13.0 - 17.0 g/dL   HCT 26.7 (L) 39.0 - 52.0 %  CBC     Status: Abnormal   Collection Time: 10/02/17  5:36 AM  Result Value Ref Range   WBC 6.2 4.0 - 10.5 K/uL   RBC 2.82 (L) 4.22 - 5.81 MIL/uL   Hemoglobin 9.3 (L) 13.0 - 17.0 g/dL   HCT 27.0 (L) 39.0 - 52.0 %   MCV 95.7 78.0 - 100.0 fL   MCH 33.0 26.0 - 34.0 pg   MCHC 34.4 30.0 - 36.0 g/dL   RDW 15.0 11.5 - 15.5 %   Platelets 153 150 - 400 K/uL  Basic metabolic panel     Status: Abnormal   Collection Time: 10/02/17  5:36 AM  Result Value Ref Range   Sodium 133 (L) 135 - 145 mmol/L   Potassium 4.3 3.5 - 5.1 mmol/L   Chloride 105 101 - 111 mmol/L   CO2 22 22 - 32 mmol/L   Glucose, Bld 96 65 - 99 mg/dL   BUN 39 (H) 6 - 20 mg/dL   Creatinine, Ser 1.01 0.61 - 1.24 mg/dL   Calcium 8.0 (L) 8.9 - 10.3 mg/dL   GFR calc non Af Amer >60 >60 mL/min   GFR calc Af Amer >60 >60 mL/min   Anion gap 6 5 - 15  Protime-INR     Status: Abnormal   Collection Time: 10/02/17  5:36 AM  Result Value Ref Range   Prothrombin Time 15.6 (H) 11.4 - 15.2 seconds   INR 1.25   Magnesium     Status: None   Collection Time: 10/02/17  5:36 AM  Result Value Ref Range   Magnesium 2.1 1.7 - 2.4 mg/dL     Studies/Results: No results found.    Assessment: Coffee-ground emesis most likely secondary to erosive esophagitis  Plan:   Continue PPI therapy. I think we can advance his diet as tolerated. I think that his Coumadin can be resumed in a few days. I would recommend lifelong PPI therapy. We will sign off. Call us if needed.    SAM F Railey Glad 10/02/2017, 9:59 AM  Pager: [phone] If no answer or after 5 PM call [phone]

## 2017-10-02 NOTE — Progress Notes (Signed)
Physical Therapy Treatment Patient Details Name: [name] MRN: [id number] DOB: 1923/03/01 Today's Date: 10/02/2017    History of Present Illness 83 yo male admitted with L hip fx after sustaining a fall at home. S/P L hip hemi 09/29/17. Hx of A fib, RA, falls.     PT Comments    Pt continues to participate well. Encouraged him to continue getting to recliner, bsc with nursing as tolerated. Continue to recommend SNF for rehab.    Follow Up Recommendations  SNF     Equipment Recommendations  None recommended by PT    Recommendations for Other Services       Precautions / Restrictions Precautions Precautions: Fall;Posterior Hip Precaution Comments: Educated pt on posterior hip precautions. Pt able to recall 1/3.  Restrictions Weight Bearing Restrictions: No LLE Weight Bearing: Weight bearing as tolerated    Mobility  Bed Mobility Overal bed mobility: Needs Assistance Bed Mobility: Sit to Supine       Sit to supine: Min assist;HOB elevated   General bed mobility comments: Small amount of assist for L LE. Cues and assist to maintain L hip precautions.   Transfers Overall transfer level: Needs assistance Equipment used: Rolling walker (2 wheeled) Transfers: Sit to/from [employer] Transfers Sit to Stand:From elevated surface;Mod assist Stand pivot transfers: Min assist       General transfer comment: Mod assist from recliner. Min assist from [facility]. VCS safety, technique, hand/LE placement. Stand pivot, recliner to bsc, with RW.   Ambulation/Gait Ambulation/Gait assistance: Min assist Ambulation Distance (Feet): 3 Feet Assistive device: Rolling walker (2 wheeled) Gait Pattern/deviations: Trunk flexed;Step-to pattern     General Gait Details: Assist to stabilize pt, maneuver safely with RW. Only took a few steps from bsc to bed due to pt having diarrhea on today.    Stairs            Wheelchair Mobility    Modified Rankin (Stroke Patients  Only)       Balance                                            Cognition Arousal/Alertness: Awake/alert Behavior During Therapy: WFL for tasks assessed/performed Overall Cognitive Status: Within Functional Limits for tasks assessed                                        Exercises General Exercises - Lower Extremity Ankle Circles/Pumps: AROM;Both;15 reps;Supine Quad Sets: AROM;Both;15 reps;Supine Short Arc Quad: AROM;Left;15 reps;Supine Long Arc Quad: AROM;Both;15 reps;Seated Heel Slides: AAROM;Left;15 reps;Supine Hip ABduction/ADduction: AAROM;Left;15 reps;Supine    General Comments        Pertinent Vitals/Pain Pain Assessment: Faces Faces Pain Scale: Hurts little more Pain Location: L hip/thigh Pain Descriptors / Indicators: Sore Pain Intervention(s): Monitored during session;Repositioned    Home Living                      Prior Function            PT Goals (current goals can now be found in the care plan section) Progress towards PT goals: Progressing toward goals    Frequency    Min 3X/week      PT Plan Current plan remains appropriate    Co-evaluation  AM-PAC PT "6 Clicks" Daily Activity  Outcome Measure  Difficulty turning over in bed (including adjusting bedclothes, sheets and blankets)?: Unable Difficulty moving from lying on back to sitting on the side of the bed? : Unable Difficulty sitting down on and standing up from a chair with arms (e.g., wheelchair, bedside commode, etc,.)?: Unable Help needed moving to and from a bed to chair (including a wheelchair)?: A Lot Help needed walking in hospital room?: A Lot Help needed climbing 3-5 steps with a railing? : Total 6 Click Score: 8    End of Session Equipment Utilized During Treatment: Gait belt Activity Tolerance: Patient tolerated treatment well Patient left: in bed;with call bell/phone within reach;with family/visitor  present;with bed alarm set   PT Visit Diagnosis: Muscle weakness (generalized) (M62.81);Difficulty in walking, not elsewhere classified (R26.2);Pain Pain - Right/Left: Left Pain - part of body: Leg     Time: 1456-1530 PT Time Calculation (min) (ACUTE ONLY): 34 min  Charges:  $Therapeutic Exercise: 8-22 mins $Therapeutic Activity: 8-22 mins                    G Codes:          Weston Anna, MPT Pager: [phone]

## 2017-10-03 DIAGNOSIS — K2210 Ulcer of esophagus without bleeding: Secondary | ICD-10-CM | POA: Diagnosis present

## 2017-10-03 DIAGNOSIS — I10 Essential (primary) hypertension: Secondary | ICD-10-CM | POA: Diagnosis present

## 2017-10-03 LAB — CBC
HCT: 25.5 % — ABNORMAL LOW (ref 39.0–52.0)
Hemoglobin: 9 g/dL — ABNORMAL LOW (ref 13.0–17.0)
MCH: 34 pg (ref 26.0–34.0)
MCHC: 35.3 g/dL (ref 30.0–36.0)
MCV: 96.2 fL (ref 78.0–100.0)
PLATELETS: 162 10*3/uL (ref 150–400)
RBC: 2.65 MIL/uL — ABNORMAL LOW (ref 4.22–5.81)
RDW: 15.2 % (ref 11.5–15.5)
WBC: 5.8 10*3/uL (ref 4.0–10.5)

## 2017-10-03 LAB — BASIC METABOLIC PANEL
Anion gap: 5 (ref 5–15)
BUN: 30 mg/dL — AB (ref 6–20)
CHLORIDE: 106 mmol/L (ref 101–111)
CO2: 22 mmol/L (ref 22–32)
CREATININE: 0.94 mg/dL (ref 0.61–1.24)
Calcium: 7.7 mg/dL — ABNORMAL LOW (ref 8.9–10.3)
GFR calc Af Amer: >60 mL/min (ref >60)
GFR calc non Af Amer: >60 mL/min (ref >60)
Glucose, Bld: 105 mg/dL — ABNORMAL HIGH (ref 65–99)
Potassium: 4.3 mmol/L (ref 3.5–5.1)
SODIUM: 133 mmol/L — AB (ref 135–145)

## 2017-10-03 LAB — PROTIME-INR
INR: 1.41
Prothrombin Time: 17.2 seconds — ABNORMAL HIGH (ref 11.4–15.2)

## 2017-10-03 NOTE — Progress Notes (Signed)
Patient ID: [name], male   DOB: [birth year], 83 y.o.   MRN: [id number]  PROGRESS NOTE    Eloy Fehl  OMV:[id number] DOB: December 08, 1922 DOA: 09/28/2017 PCP: Merrilee Seashore, MD   Brief Narrative:  83 year old male with history of atrial fibrillation on anticoagulation presented with a fall and left leg pain.  He was found to have left hip fracture repaired with arthroplasty 11/18. On 11/19 he developed coffee-ground emesis for which GI was consulted. EGD was discussed with the patient and family who opted for conservative management with PPI, holding coumadin, and observation. .   Assessment & Plan:  Principal Problem:   Closed left hip fracture (HCC) Active Problems:   A-fib (HCC)   Anemia   Hematemesis   Stricture and stenosis of esophagus   Erosive esophagitis   Hypertension  Acute upper GI bleeding and acute blood loss anemia, suspect erosive esophagitis:  - Continue PPI indefinitely - Hold coumadin, plan to restart in several days after bleeding reliably subsides. - Advance liquids > regular diet today, DC in AM if hemostatic with advanced diet.  Closed left hip fracture due to mechanical fall: s/p left THA 09/29/2017. - WBAT LLE - Continue PT/OT, DC to SNF as above. - Follow up with orthopedics as outpatient in 2 weeks.   Chronic atrial fibrillation/flutter - Continue with cardizem and metoprolol. Intermittently elevated rate, but not persistently elevated. Consider increasing metoprolol to 100mg  daily if needed. BP is not low. Telemetry reviewed this AM.  - Hold coumadin as above  Leukocytosis: Resolved.  Mild hyponatremia: Improving.  - Monitor  DVT prophylaxis: SCDs code Status: DNR Family Communication: Wife at bedside Disposition Plan: SNF 11/23 if no further bleeding, hgb stable with advanced diet.  Consultants:  Orthopedics GI  Procedures: Left hip hemiarthroplasty on 09/29/2017  Antimicrobials: Patient has been on keflex as an  outpatient  Subjective: Having no pain at all, even in left hip. Getting up to bathroom, eating well, denies BM but declines bowel program. No nausea or vomiting with liquid diet.  Objective: Vitals:   10/02/17 1535 10/02/17 1813 10/02/17 2110 10/03/17 0553  BP:  121/83 121/77 126/86  Pulse: (!) 113  (!) 110 (!) 111  Resp:   16 18  Temp:   98.6 F (37 C) 98.2 F (36.8 C)  TempSrc:   Oral Oral  SpO2:   98% 98%  Weight:      Height:        Intake/Output Summary (Last 24 hours) at 10/03/2017 1542 Last data filed at 10/03/2017 1050 Gross per 24 hour  Intake 3742.5 ml  Output 500 ml  Net 3242.5 ml   Filed Weights   09/28/17 1022 09/28/17 1801  Weight: 82.6 kg (182 lb) 82.2 kg (181 lb 3.5 oz)   General exam: Pleasant elderly male in bed in no distress Respiratory system: Diminished but clear, nonlabored on room air.  Cardiovascular system: Irreg irreg with rate in 90's, no murmur, no JVD.  gastrointestinal system: Abdomen is nondistended, soft and nontender. Normal bowel sounds heard. Extremities: Left thigh compartment soft, incision dressing c/d/i, full ROM brisk cap refill. Sensation and strength intact distally.   CBC: Recent Labs  Lab 09/28/17 1340 09/29/17 0555 09/30/17 0514 10/01/17 0441 10/01/17 1750 10/02/17 0536 10/03/17 0822  WBC 13.8* 7.8 12.4* 14.6*  --  6.2 5.8  NEUTROABS 11.9*  --  11.1*  --   --   --   --   HGB 12.2* 11.8* 11.3* 10.5* 9.3* 9.3* 9.0*  HCT  35.1* 34.4* 33.2* 30.2* 26.7* 27.0* 25.5*  MCV 95.6 96.6 96.2 95.6  --  95.7 96.2  PLT 194 180 187 172  --  153 308   Basic Metabolic Panel: Recent Labs  Lab 09/29/17 0555 09/30/17 0514 10/01/17 0441 10/02/17 0536 10/03/17 0509  NA 131* 130* 130* 133* 133*  K 4.7 4.1 5.1 4.3 4.3  CL 102 98* 99* 105 106  CO2 24 22 24 22 22   GLUCOSE 121* 171* 125* 96 105*  BUN 16 26* 52* 39* 30*  CREATININE 1.04 1.19 1.18 1.01 0.94  CALCIUM 8.4* 8.6* 8.3* 8.0* 7.7*  MG  --  1.8 2.0 2.1  --     GFR: Estimated Creatinine Clearance: 51.8 mL/min (by C-G formula based on SCr of 0.94 mg/dL).  Coagulation Profile: Recent Labs  Lab 09/29/17 0555 09/30/17 0514 10/01/17 0441 10/02/17 0536 10/03/17 0509  INR 1.34 1.31 2.39 1.25 1.41    Recent Results (from the past 240 hour(s))  Surgical pcr screen     Status: None   Collection Time: 09/28/17  5:00 PM  Result Value Ref Range Status   MRSA, PCR NEGATIVE NEGATIVE Final   Staphylococcus aureus NEGATIVE NEGATIVE Final    Comment: (NOTE) The Xpert SA Assay (FDA approved for NASAL specimens in patients 93 years of age and older), is one component of a comprehensive surveillance program. It is not intended to diagnose infection nor to guide or monitor treatment.     Scheduled Meds: . diltiazem (CARDIZEM CD) 24 hr capsule 360 mg  360 mg Oral Daily  . docusate sodium  100 mg Oral BID  . feeding supplement (ENSURE ENLIVE)  237 mL Oral BID BM  . ferrous sulfate  325 mg Oral TID PC  . metoprolol succinate  50 mg Oral Daily  . pantoprazole (PROTONIX) IV  40 mg Intravenous BID   Continuous Infusions: . methocarbamol (ROBAXIN)  IV       LOS: 5 days   Vance Gather, MD [facility] Pager [phone]  If 7PM-7AM, please contact night-coverage [email or web address] Password [facility] 10/03/2017, 3:42 PM

## 2017-10-03 NOTE — Plan of Care (Signed)
  Progressing Health Behavior/Discharge Planning: Ability to manage health-related needs will improve 10/03/2017 1610 - Progressing by Saunders Glance, RN

## 2017-10-04 DIAGNOSIS — I10 Essential (primary) hypertension: Secondary | ICD-10-CM | POA: Diagnosis not present

## 2017-10-04 DIAGNOSIS — D649 Anemia, unspecified: Secondary | ICD-10-CM | POA: Diagnosis not present

## 2017-10-04 DIAGNOSIS — B351 Tinea unguium: Secondary | ICD-10-CM | POA: Diagnosis not present

## 2017-10-04 DIAGNOSIS — R262 Difficulty in walking, not elsewhere classified: Secondary | ICD-10-CM | POA: Diagnosis not present

## 2017-10-04 DIAGNOSIS — L84 Corns and callosities: Secondary | ICD-10-CM | POA: Diagnosis not present

## 2017-10-04 DIAGNOSIS — Z471 Aftercare following joint replacement surgery: Secondary | ICD-10-CM | POA: Diagnosis not present

## 2017-10-04 DIAGNOSIS — Z96649 Presence of unspecified artificial hip joint: Secondary | ICD-10-CM | POA: Diagnosis not present

## 2017-10-04 DIAGNOSIS — K920 Hematemesis: Secondary | ICD-10-CM | POA: Diagnosis not present

## 2017-10-04 DIAGNOSIS — D689 Coagulation defect, unspecified: Secondary | ICD-10-CM | POA: Diagnosis not present

## 2017-10-04 DIAGNOSIS — Z96612 Presence of left artificial shoulder joint: Secondary | ICD-10-CM | POA: Diagnosis not present

## 2017-10-04 DIAGNOSIS — I482 Chronic atrial fibrillation: Secondary | ICD-10-CM

## 2017-10-04 DIAGNOSIS — G8911 Acute pain due to trauma: Secondary | ICD-10-CM | POA: Diagnosis not present

## 2017-10-04 DIAGNOSIS — S72009A Fracture of unspecified part of neck of unspecified femur, initial encounter for closed fracture: Secondary | ICD-10-CM | POA: Diagnosis not present

## 2017-10-04 DIAGNOSIS — S72002A Fracture of unspecified part of neck of left femur, initial encounter for closed fracture: Secondary | ICD-10-CM | POA: Diagnosis not present

## 2017-10-04 DIAGNOSIS — K2210 Ulcer of esophagus without bleeding: Secondary | ICD-10-CM | POA: Diagnosis not present

## 2017-10-04 DIAGNOSIS — K222 Esophageal obstruction: Secondary | ICD-10-CM | POA: Diagnosis not present

## 2017-10-04 DIAGNOSIS — R278 Other lack of coordination: Secondary | ICD-10-CM | POA: Diagnosis not present

## 2017-10-04 DIAGNOSIS — I4891 Unspecified atrial fibrillation: Secondary | ICD-10-CM | POA: Diagnosis not present

## 2017-10-04 DIAGNOSIS — K922 Gastrointestinal hemorrhage, unspecified: Secondary | ICD-10-CM | POA: Diagnosis not present

## 2017-10-04 DIAGNOSIS — M6281 Muscle weakness (generalized): Secondary | ICD-10-CM | POA: Diagnosis not present

## 2017-10-04 DIAGNOSIS — Z96642 Presence of left artificial hip joint: Secondary | ICD-10-CM | POA: Diagnosis not present

## 2017-10-04 LAB — CBC
HCT: 24.3 % — ABNORMAL LOW (ref 39.0–52.0)
HEMOGLOBIN: 8.5 g/dL — AB (ref 13.0–17.0)
MCH: 33.7 pg (ref 26.0–34.0)
MCHC: 35 g/dL (ref 30.0–36.0)
MCV: 96.4 fL (ref 78.0–100.0)
Platelets: 182 10*3/uL (ref 150–400)
RBC: 2.52 MIL/uL — AB (ref 4.22–5.81)
RDW: 15.3 % (ref 11.5–15.5)
WBC: 5.7 10*3/uL (ref 4.0–10.5)

## 2017-10-04 LAB — PROTIME-INR
INR: 1.48
PROTHROMBIN TIME: 17.8 s — AB (ref 11.4–15.2)

## 2017-10-04 MED ORDER — FISH OIL 1000 MG PO CAPS: 1.0000 | ORAL_CAPSULE | Freq: Two times a day (BID) | ORAL | 0 refills | Status: AC

## 2017-10-04 MED ORDER — PANTOPRAZOLE SODIUM 40 MG PO TBEC: 40.0000 mg | DELAYED_RELEASE_TABLET | Freq: Two times a day (BID) | ORAL | Status: AC

## 2017-10-04 NOTE — Progress Notes (Signed)
Patient's IV and telemetry removed for DC to SNF, family at bedside, [facility] to transport

## 2017-10-04 NOTE — Care Management Note (Signed)
Case Management Note  Patient Details  Name: [name] MRN: [id number] Date of Birth: May 24, 1923  Subjective/Objective: CSW managing SNF.                   Action/Plan:dc SNF.   Expected Discharge Date:  10/04/17               Expected Discharge Plan:  Skilled Nursing Facility  In-House Referral:  Clinical Social Work  Discharge planning Services  CM Consult  Post Acute Care Choice:    Choice offered to:     DME Arranged:    DME Agency:     HH Arranged:    [place] Agency:     Status of Service:  Completed, signed off  If discussed at [employer], dates discussed:    Additional Comments:  Dessa Phi, RN 10/04/2017, 11:58 AM

## 2017-10-04 NOTE — Discharge Summary (Signed)
Physician Discharge Summary  [name] JDB:[id number] DOB: 1923-10-13 DOA: 09/28/2017  PCP: Merrilee Seashore, MD  Admit date: 09/28/2017 Discharge date: 10/04/2017  Admitted From: Home Disposition: SNF   Recommendations for Outpatient Follow-up:  1. Follow up with PCP in 1-2 weeks 2. Please obtain BMP, CBC, and PT-INR next week. Will need to restart coumadin within the next week.  3. Ongoing tobacco cessation counseling recommended 4. Follow up with orthopedics in 2 weeks.  Home Health: N/A Equipment/Devices: Per SNF Discharge Condition: Stable CODE STATUS: DNR Diet recommendation: Heart healthy  Brief/Interim Summary: 83 year old male with history of atrial fibrillation on anticoagulation presented with a fall and left leg pain.  He was found to have left hip fracture repaired with arthroplasty 11/18. On 11/19 he developed coffee-ground emesis for which GI was consulted. EGD was discussed with the patient and family who opted for conservative management with PPI, holding coumadin, and observation. He has had no further bleeding, and is stable for discharge.   Discharge Diagnoses:  Principal Problem:   Closed left hip fracture ([place]) Active Problems:   A-fib (HCC)   Anemia   Hematemesis   Stricture and stenosis of esophagus   Erosive esophagitis   Hypertension  Acute upper GI bleeding due to erosive esophagitis - Continue PPI indefinitely - Continue solid diet  Acute blood loss anemia due to bleeding and post-operative anemia: - Hold coumadin (was taking 2mg  daily except 4mg  on Wednesday), plan to restart in several days after bleeding reliably subsides, within the next week. INR 1.48 on day of discharge. - Continue iron replacement by mouth. Pt has been refusing this due to history of constipation with iron pills. Recommend coadministration with stool softener (colace and miralax also prescribed)  Closed left hip fracture due to mechanical fall: s/p left THA  09/29/2017. - WBAT LLE - Continue PT/OT, DC to SNF as above. - Pain control per orthopedics: hydrocodone and robaxin - Follow up with orthopedics as outpatient in 2 weeks.   Chronic atrial fibrillation/flutter - Continue with cardizem and metoprolol. Intermittently elevated rate, but not persistently elevated. Consider increasing metoprolol to 100mg  daily if needed. BP is not low. - Hold coumadin as above  Leukocytosis: Resolved.  Mild hyponatremia: Improving.  - Monitor  Discharge Instructions Discharge Instructions    Diet - low sodium heart healthy   Complete by:  As directed    Weight bearing as tolerated   Complete by:  As directed    Laterality:  left   Extremity:  Lower     Allergies as of 10/04/2017      Reactions   Shellfish Allergy Anaphylaxis   Sulfa Antibiotics Anaphylaxis      Medication List    STOP taking these medications   cephALEXin 500 MG capsule Commonly known as:  KEFLEX   warfarin 2 MG tablet Commonly known as:  COUMADIN     TAKE these medications   diltiazem 360 MG 24 hr capsule Commonly known as:  TIAZAC Take 360 mg by mouth daily.   docusate sodium 100 MG capsule Commonly known as:  COLACE Take 1 capsule (100 mg total) 2 (two) times daily by mouth.   ferrous sulfate 325 (65 FE) MG tablet Take 1 tablet (325 mg total) 3 (three) times daily after meals by mouth.   Fish Oil 1000 MG Caps Take 1 capsule (1,000 mg total) by mouth 2 (two) times daily. What changed:  how much to take   HYDROcodone-acetaminophen 5-325 MG tablet Commonly known as:  NORCO/VICODIN  Take 1-2 tablets every 6 (six) hours as needed by mouth for moderate pain.   methocarbamol 500 MG tablet Commonly known as:  ROBAXIN Take 1 tablet (500 mg total) every 6 (six) hours as needed by mouth for muscle spasms.   metoprolol succinate 50 MG 24 hr tablet Commonly known as:  TOPROL-XL Take 50 mg by mouth daily.   pantoprazole 40 MG tablet Commonly known as:   PROTONIX Take 1 tablet (40 mg total) by mouth 2 (two) times daily.   polyethylene glycol packet Commonly known as:  MIRALAX / GLYCOLAX Take 17 g daily as needed by mouth for mild constipation.   PRESERVISION AREDS 2 Caps Take 1 capsule by mouth 2 (two) times daily.            Discharge Care Instructions  (From admission, onward)        Start     Ordered   10/01/17 0000  Weight bearing as tolerated    Question Answer Comment  Laterality left   Extremity Lower      10/01/17 0951      Contact information for follow-up providers    Paralee Cancel, MD. Schedule an appointment as soon as possible for a visit in 2 week(s).   Specialty:  Orthopedic Surgery Contact information: [address] [place] [address] [phone]        Merrilee Seashore, MD Follow up.   Specialty:  Internal Medicine Contact information: [place] [address] [phone]            Contact information for after-discharge care    Destination    [facility] Follow up.   Service:  Skilled Nursing Contact information: [place] [address] [phone]                 Allergies  Allergen Reactions  . Shellfish Allergy Anaphylaxis  . Sulfa Antibiotics Anaphylaxis    Consultations:  Orthopedics  GI  Procedures/Studies: Ct Head Wo Contrast  Result Date: 09/28/2017 CLINICAL DATA:  Fall.  Patient denies head injury. EXAM: CT HEAD WITHOUT CONTRAST TECHNIQUE: Contiguous axial images were obtained from the base of the skull through the vertex without intravenous contrast. COMPARISON:  CT head dated March 09, 2015. FINDINGS: Brain: No evidence of acute infarction, hemorrhage, hydrocephalus, extra-axial collection or mass lesion/mass effect. Stable moderate cerebral atrophy and chronic microvascular ischemic white matter disease. Vascular: No hyperdense vessel or unexpected calcification.  Skull: Negative for fracture or focal lesion. Sinuses/Orbits: No acute finding. Other: None. IMPRESSION: 1. No acute intracranial abnormality. Stable moderate cerebral atrophy and chronic microvascular ischemic white matter disease. Electronically Signed   By: Titus Dubin M.D.   On: 09/28/2017 15:34   Pelvis Portable  Result Date: 09/29/2017 CLINICAL DATA:  Status post left hip replacement. EXAM: PORTABLE PELVIS 1-2 VIEWS COMPARISON:  09/18/2017 FINDINGS: Interval left hip hemiarthroplasty. No evidence for immediate hardware complication. Gas in the soft tissues compatible with the recent surgery. Bones diffusely demineralized. IMPRESSION: Status post left hip hemiarthroplasty without evidence for immediate hardware complications. Electronically Signed   By: Misty Stanley M.D.   On: 09/29/2017 15:17   Chest Portable 1 View  Result Date: 09/28/2017 CLINICAL DATA:  Preoperative evaluation for hip surgery EXAM: PORTABLE CHEST 1 VIEW COMPARISON:  03/09/2015 FINDINGS: Cardiac shadow is mildly prominent but stable from the prior exam. Aortic calcifications are again seen. Lungs are well aerated bilaterally with minimal scarring in the left base. No acute  bony abnormality is seen. IMPRESSION: No acute abnormality noted. Electronically Signed   By: Inez Catalina M.D.   On: 09/28/2017 19:21   Dg Hip Unilat W Or Wo Pelvis 2-3 Views Left  Result Date: 09/28/2017 CLINICAL DATA:  Fall.  Left hip pain. EXAM: DG HIP (WITH OR WITHOUT PELVIS) 2-3V LEFT COMPARISON:  None. FINDINGS: There is a left basicervical femoral neck fracture. No subluxation or dislocation. Mild degenerative changes in the hips bilaterally. SI joints are symmetric and unremarkable. IMPRESSION: Left basicervical femoral neck fracture. Electronically Signed   By: Rolm Baptise M.D.   On: 09/28/2017 12:24   Dg Femur Min 2 Views Left  Result Date: 09/28/2017 CLINICAL DATA:  Fall with left mid thigh pain.  Initial encounter. EXAM: LEFT FEMUR 2  VIEWS COMPARISON:  None. FINDINGS: There appears to be a minimally displaced subcapital fracture of the proximal left femur. The uppermost images are not exactly centered over the hip and further evaluation with a dedicated hip series would be helpful. The rest of the femur appears intact without fracture or lesion. Extensive vascular calcifications are present. IMPRESSION: Probable subcapital hip fracture. However, the femur films are not well centered over the hip joint and it is recommended that additional correlation be performed with a left hip x-ray series. Electronically Signed   By: Aletta Edouard M.D.   On: 09/28/2017 11:17   Left hip hemiarthroplasty on 09/29/2017  Subjective: Poor sleep last night but eating well, had constipated large BM last night. Urinating regularly. Again denies any pain including abdominal pain and left hip-related pain. No bleeding or bruising, no nausea or vomiting.   Discharge Exam: Vitals:   10/03/17 1740 10/04/17 0501  BP:  119/82  Pulse: 81 (!) 116  Resp:  16  Temp: 98.7 F (37.1 C) 97.9 F (36.6 C)  SpO2: 99% 97%   General: Pt is alert, awake, not in acute distress Cardiovascular: Irreg irreg, rate ~70, no murmur or JVD or edema Respiratory: Clear, nonlabored Abdominal: Soft, NT, ND, bowel sounds +  Labs: Basic Metabolic Panel: Recent Labs  Lab 09/29/17 0555 09/30/17 0514 10/01/17 0441 10/02/17 0536 10/03/17 0509  NA 131* 130* 130* 133* 133*  K 4.7 4.1 5.1 4.3 4.3  CL 102 98* 99* 105 106  CO2 24 22 24 22 22   GLUCOSE 121* 171* 125* 96 105*  BUN 16 26* 52* 39* 30*  CREATININE 1.04 1.19 1.18 1.01 0.94  CALCIUM 8.4* 8.6* 8.3* 8.0* 7.7*  MG  --  1.8 2.0 2.1  --    CBC: Recent Labs  Lab 09/28/17 1340  09/30/17 0514 10/01/17 0441 10/01/17 1750 10/02/17 0536 10/03/17 0822 10/04/17 0444  WBC 13.8*   < > 12.4* 14.6*  --  6.2 5.8 5.7  NEUTROABS 11.9*  --  11.1*  --   --   --   --   --   HGB 12.2*   < > 11.3* 10.5* 9.3* 9.3* 9.0*  8.5*  HCT 35.1*   < > 33.2* 30.2* 26.7* 27.0* 25.5* 24.3*  MCV 95.6   < > 96.2 95.6  --  95.7 96.2 96.4  PLT 194   < > 187 172  --  153 162 182   < > = values in this interval not displayed.   Lab Results  Component Value Date   INR 1.48 10/04/2017   INR 1.41 10/03/2017   INR 1.25 10/02/2017     Time coordinating discharge: Approximately 40 minutes  Vance Gather, MD  [facility] 10/04/2017, 10:30 AM Pager [phone]

## 2017-10-04 NOTE — Progress Notes (Signed)
Physical Therapy Treatment Patient Details Name: Breton Berns MRN: [id number] DOB: 09-21-1923 Today's Date: 10/04/2017    History of Present Illness 83 yo male admitted with L hip fx after sustaining a fall at home. S/P L hip hemi 09/29/17. Hx of A fib, RA, falls.     PT Comments    Progressing well with mobility. Continues to require cues for adherence to posterior precautions. Possible d/c to SNF today.    Follow Up Recommendations  SNF     Equipment Recommendations  None recommended by PT    Recommendations for Other Services       Precautions / Restrictions Precautions Precautions: Fall;Posterior Hip Precaution Comments: Educated pt on posterior hip precautions. Pt able to recall 1/3.  Restrictions Weight Bearing Restrictions: No LLE Weight Bearing: Weight bearing as tolerated    Mobility  Bed Mobility Overal bed mobility: Needs Assistance Bed Mobility: Supine to Sit;Sit to Supine     Supine to sit: Min guard;HOB elevated Sit to supine: Min assist;HOB elevated   General bed mobility comments: Small amount of assist for L LE. Cues and assist to maintain L hip precautions.   Transfers Overall transfer level: Needs assistance Equipment used: Rolling walker (2 wheeled) Transfers: Sit to/from Stand Sit to Stand: Min assist;From elevated surface         General transfer comment: 2 attempts for pt to rise unassisted. Small amount of assist to stabilize once standing. VCS safety, technique, hand/LE placement  Ambulation/Gait Ambulation/Gait assistance: Min assist Ambulation Distance (Feet): 75 Feet Assistive device: Rolling walker (2 wheeled) Gait Pattern/deviations: Step-through pattern;Decreased stride length;Trunk flexed     General Gait Details: Small amount of assist to steady intermittently. VCS safety, posture, distance from walker.    Stairs            Wheelchair Mobility    Modified Rankin (Stroke Patients Only)       Balance                                             Cognition Arousal/Alertness: Awake/alert Behavior During Therapy: WFL for tasks assessed/performed Overall Cognitive Status: Within Functional Limits for tasks assessed                                        Exercises General Exercises - Lower Extremity Ankle Circles/Pumps: AROM;Both;15 reps;Supine Quad Sets: AROM;Both;15 reps;Supine Short Arc Quad: AROM;Left;15 reps;Supine Long Arc Quad: AROM;Both;Seated;10 reps Heel Slides: AAROM;Left;15 reps;Supine    General Comments        Pertinent Vitals/Pain Pain Assessment: Faces Faces Pain Scale: Hurts little more Pain Location: L hip/thigh Pain Descriptors / Indicators: Sore Pain Intervention(s): Monitored during session    Home Living                      Prior Function            PT Goals (current goals can now be found in the care plan section) Progress towards PT goals: Progressing toward goals    Frequency    Min 3X/week      PT Plan Current plan remains appropriate    Co-evaluation              AM-PAC PT "6 Clicks" Daily Activity  Outcome Measure  Difficulty turning over in bed (including adjusting bedclothes, sheets and blankets)?: A Little Difficulty moving from lying on back to sitting on the side of the bed? : A Little Difficulty sitting down on and standing up from a chair with arms (e.g., wheelchair, bedside commode, etc,.)?: Unable Help needed moving to and from a bed to chair (including a wheelchair)?: A Little Help needed walking in hospital room?: A Little Help needed climbing 3-5 steps with a railing? : A Lot 6 Click Score: 15    End of Session Equipment Utilized During Treatment: Gait belt Activity Tolerance: Patient tolerated treatment well Patient left: in bed;with bed alarm set;with call bell/phone within reach;with family/visitor present   PT Visit Diagnosis: Muscle weakness (generalized)  (M62.81);Difficulty in walking, not elsewhere classified (R26.2) Pain - Right/Left: Left Pain - part of body: Leg     Time: 1027-1040 PT Time Calculation (min) (ACUTE ONLY): 13 min  Charges:  $Gait Training: 8-22 mins                    G Codes:          Weston Anna, MPT Pager: [phone]

## 2017-10-04 NOTE — Clinical Social Work Placement (Addendum)
PT[facility] called for transport.  Nurse given number for report. Med. Adelfa Koh Lytle Michaels signed.    CLINICAL SOCIAL WORK PLACEMENT  NOTE  Date:  10/04/2017  Patient Details  Name: [name] MRN: [id number] Date of Birth: 07-Apr-1923  Clinical Social Work is seeking post-discharge placement for this patient at the [place] level of care (*CSW will initial, date and re-position this form in  chart as items are completed):  Yes   Patient/family provided with [facility] Work Department's list of facilities offering this level of care within the geographic area requested by the patient (or if unable, by the patient's family).  Yes   Patient/family informed of their freedom to choose among providers that offer the needed level of care, that participate in Medicare, Medicaid or managed care program needed by the patient, have an available bed and are willing to accept the patient.  Yes   Patient/family informed of [facility]'s ownership interest in [facility] and [facility], as well as of the fact that they are under no obligation to receive care at these facilities.  PASRR submitted to EDS on       PASRR number received on       Existing PASRR number confirmed on 09/30/17     FL2 transmitted to all facilities in geographic area requested by pt/family on 09/30/17     FL2 transmitted to all facilities within larger geographic area on       Patient informed that his/her managed care company has contracts with or will negotiate with certain facilities, including the following:  [employer]     Yes   Patient/family informed of bed offers received.  Patient chooses bed at [facility]     Physician recommends and patient chooses bed at [facility]    Patient to be transferred to [facility] on 10/04/17.  Patient to be transferred to facility by PTAR     Patient family notified on 10/04/17 of transfer.  Name of family member notified:  son [name]  and wife [name]     PHYSICIAN Please sign DNR, Please prepare prescriptions     Additional Comment:    _______________________________________________ Lia Hopping, LCSW 10/04/2017, 11:15 AM

## 2017-10-07 DIAGNOSIS — K922 Gastrointestinal hemorrhage, unspecified: Secondary | ICD-10-CM | POA: Diagnosis not present

## 2017-10-07 DIAGNOSIS — I4891 Unspecified atrial fibrillation: Secondary | ICD-10-CM | POA: Diagnosis not present

## 2017-10-07 DIAGNOSIS — S72002A Fracture of unspecified part of neck of left femur, initial encounter for closed fracture: Secondary | ICD-10-CM | POA: Diagnosis not present

## 2017-10-07 DIAGNOSIS — K222 Esophageal obstruction: Secondary | ICD-10-CM | POA: Diagnosis not present

## 2017-10-14 DIAGNOSIS — K922 Gastrointestinal hemorrhage, unspecified: Secondary | ICD-10-CM | POA: Diagnosis not present

## 2017-10-14 DIAGNOSIS — Z96642 Presence of left artificial hip joint: Secondary | ICD-10-CM | POA: Diagnosis not present

## 2017-10-14 DIAGNOSIS — Z96612 Presence of left artificial shoulder joint: Secondary | ICD-10-CM | POA: Diagnosis not present

## 2017-10-14 DIAGNOSIS — Z471 Aftercare following joint replacement surgery: Secondary | ICD-10-CM | POA: Diagnosis not present

## 2017-10-14 DIAGNOSIS — I4891 Unspecified atrial fibrillation: Secondary | ICD-10-CM | POA: Diagnosis not present

## 2017-10-16 DIAGNOSIS — D649 Anemia, unspecified: Secondary | ICD-10-CM | POA: Diagnosis not present

## 2017-10-16 DIAGNOSIS — I4891 Unspecified atrial fibrillation: Secondary | ICD-10-CM | POA: Diagnosis not present

## 2017-10-18 ENCOUNTER — Ambulatory Visit (INDEPENDENT_AMBULATORY_CARE_PROVIDER_SITE_OTHER): Payer: Medicare Other | Admitting: Podiatry

## 2017-10-18 ENCOUNTER — Encounter: Payer: Self-pay | Admitting: Podiatry

## 2017-10-18 ENCOUNTER — Encounter (INDEPENDENT_AMBULATORY_CARE_PROVIDER_SITE_OTHER): Payer: Medicare Other | Admitting: Ophthalmology

## 2017-10-18 VITALS — BP 91/46 | HR 61 | Ht 72.0 in | Wt 184.0 lb

## 2017-10-18 DIAGNOSIS — B351 Tinea unguium: Secondary | ICD-10-CM | POA: Diagnosis not present

## 2017-10-18 DIAGNOSIS — L84 Corns and callosities: Secondary | ICD-10-CM | POA: Diagnosis not present

## 2017-10-18 DIAGNOSIS — D689 Coagulation defect, unspecified: Secondary | ICD-10-CM | POA: Diagnosis not present

## 2017-10-18 NOTE — Progress Notes (Signed)
Subjective:  Patient ID: [name], male    DOB: 07/19/1923,  MRN: [id number]  Chief Complaint  Patient presents with  . Nail Problem    bilateral elongated thickened toenails - takes blood thinner for A-fib   83 y.o. male presents for bilateral thickened toenails.  States that he takes Coumadin for A. Fib.  Also complains of calluses to both great toes denies other issues.  Past Medical History:  Diagnosis Date  . A-fib ([place])   . Dysrhythmia    Atrial Tachycardia with block  . Erosive esophagitis   . GERD (gastroesophageal reflux disease)   . H/O fracture of rib    Left  5th, 6th, 7th had a fall in APRIL 2016  . Hypertension   . RA (rheumatoid arthritis) ([place])    Past Surgical History:  Procedure Laterality Date  . BALLOON DILATION N/A 05/04/2015   Procedure: BALLOON DILATION;  Surgeon: Wilford Corner, MD;  Location: [facility] ENDOSCOPY;  Service: Endoscopy;  Laterality: N/A;  janie/melissa  . CATARACT EXTRACTION    . ESOPHAGOGASTRODUODENOSCOPY N/A 03/12/2015   Procedure: ESOPHAGOGASTRODUODENOSCOPY (EGD);  Surgeon: Wilford Corner, MD;  Location: [facility] ENDOSCOPY;  Service: Endoscopy;  Laterality: N/A;  . ESOPHAGOGASTRODUODENOSCOPY N/A 05/04/2015   Procedure: ESOPHAGOGASTRODUODENOSCOPY (EGD);  Surgeon: Wilford Corner, MD;  Location: [facility] ENDOSCOPY;  Service: Endoscopy;  Laterality: N/A;  carm  . EYE SURGERY Bilateral 10/2015  . HIP ARTHROPLASTY Left 09/29/2017   Procedure: ARTHROPLASTY  HIP HEMIARTHROPLASTY left;  Surgeon: Paralee Cancel, MD;  Location: [facility] ORS;  Service: Orthopedics;  Laterality: Left;  . SAVORY DILATION N/A 05/04/2015   Procedure: SAVORY DILATION;  Surgeon: Wilford Corner, MD;  Location: [facility] ENDOSCOPY;  Service: Endoscopy;  Laterality: N/A;    Current Outpatient Medications:  .  cephALEXin (KEFLEX) 500 MG capsule, TAKE 1 CAPSULE BY MOUTH THREE TIMES A DAY, Disp: , Rfl: 0 .  diltiazem (TIAZAC) 360 MG 24 hr capsule, Take 360 mg by mouth daily., Disp: , Rfl: 0 .   docusate sodium (COLACE) 100 MG capsule, Take 1 capsule (100 mg total) 2 (two) times daily by mouth., Disp: 10 capsule, Rfl: 0 .  ferrous sulfate 325 (65 FE) MG tablet, Take 1 tablet (325 mg total) 3 (three) times daily after meals by mouth., Disp: , Rfl: 3 .  HYDROcodone-acetaminophen (NORCO/VICODIN) 5-325 MG tablet, Take 1-2 tablets every 6 (six) hours as needed by mouth for moderate pain., Disp: 40 tablet, Rfl: 0 .  methocarbamol (ROBAXIN) 500 MG tablet, Take 1 tablet (500 mg total) every 6 (six) hours as needed by mouth for muscle spasms., Disp: 30 tablet, Rfl: 0 .  metoprolol succinate (TOPROL-XL) 50 MG 24 hr tablet, Take 50 mg by mouth daily. , Disp: , Rfl: 1 .  Multiple Vitamins-Minerals (PRESERVISION AREDS 2) CAPS, Take 1 capsule by mouth 2 (two) times daily., Disp: , Rfl:  .  Omega-3 Fatty Acids (FISH OIL) 1000 MG CAPS, Take 1 capsule (1,000 mg total) by mouth 2 (two) times daily., Disp: , Rfl: 0 .  pantoprazole (PROTONIX) 40 MG tablet, Take 1 tablet (40 mg total) by mouth 2 (two) times daily., Disp: , Rfl:  .  polyethylene glycol (MIRALAX / GLYCOLAX) packet, Take 17 g daily as needed by mouth for mild constipation., Disp: 14 each, Rfl: 0 .  warfarin (COUMADIN) 2 MG tablet, TAKE 1 TABLET BY MOUTH EVERY DAY EXCEPT ON WEDNESDAY TAKE 2 TABS, Disp: , Rfl: 3  Allergies  Allergen Reactions  . Shellfish Allergy Anaphylaxis  . Sulfa Antibiotics Anaphylaxis  Review of Systems  Musculoskeletal: Positive for arthralgias and joint swelling.  All other systems reviewed and are negative.   Objective:   Vitals:   10/18/17 1340  BP: (!) 91/46  Pulse: 61   General AA&O x3. Normal mood and affect.  Vascular Dorsalis pedis pulses present 1+ bilaterally  Posterior tibial pulses present 1+ bilaterally  Capillary refill normal to all digits. Pedal hair growth diminished.  Neurologic Epicritic sensation present bilaterally.  Dermatologic No open lesions. Interspaces clear of maceration.    Normal skin temperature and turgor. Hyperkeratotic lesions: hallux IPJ bilaterally. Nails: Elongated thickened with subungual debris crumbly texture yellow discoloration  Orthopedic: No history of amputation. MMT 5/5 in dorsiflexion, plantarflexion, inversion, and eversion. Normal lower extremity joint ROM without pain or crepitus.   Assessment & Plan:  Patient was evaluated and treated and all questions answered.  Onychomycosis with coagulation defect -At risk foot care provided as below.  Procedure: Nail Debridement Rationale: Patient meets criteria for routine foot care due to coagulation defect Type of Debridement: manual, sharp debridement. Instrumentation: Nail nipper, rotary burr. Number of Nails: 10  Procedure: Callus Debridement Rationale: Patient meets criteria for routine foot care due to coagulation defect. Type of Debridement: sharp Instrumentation: 312 blade Number of Calluses: 2  Return in about 3 months (around 01/16/2018) for Diabetic Foot Care.

## 2017-10-23 DIAGNOSIS — S72002A Fracture of unspecified part of neck of left femur, initial encounter for closed fracture: Secondary | ICD-10-CM | POA: Diagnosis not present

## 2017-10-23 DIAGNOSIS — I4891 Unspecified atrial fibrillation: Secondary | ICD-10-CM | POA: Diagnosis not present

## 2017-10-25 ENCOUNTER — Encounter (INDEPENDENT_AMBULATORY_CARE_PROVIDER_SITE_OTHER): Payer: Medicare Other | Admitting: Ophthalmology

## 2017-10-29 DIAGNOSIS — Z96642 Presence of left artificial hip joint: Secondary | ICD-10-CM | POA: Diagnosis not present

## 2017-10-29 DIAGNOSIS — Z7901 Long term (current) use of anticoagulants: Secondary | ICD-10-CM | POA: Diagnosis not present

## 2017-10-29 DIAGNOSIS — K222 Esophageal obstruction: Secondary | ICD-10-CM | POA: Diagnosis not present

## 2017-10-29 DIAGNOSIS — K208 Other esophagitis: Secondary | ICD-10-CM | POA: Diagnosis not present

## 2017-10-29 DIAGNOSIS — I10 Essential (primary) hypertension: Secondary | ICD-10-CM | POA: Diagnosis not present

## 2017-10-29 DIAGNOSIS — I482 Chronic atrial fibrillation: Secondary | ICD-10-CM | POA: Diagnosis not present

## 2017-10-29 DIAGNOSIS — S72012D Unspecified intracapsular fracture of left femur, subsequent encounter for closed fracture with routine healing: Secondary | ICD-10-CM | POA: Diagnosis not present

## 2017-10-30 DIAGNOSIS — I10 Essential (primary) hypertension: Secondary | ICD-10-CM | POA: Diagnosis not present

## 2017-10-30 DIAGNOSIS — K222 Esophageal obstruction: Secondary | ICD-10-CM | POA: Diagnosis not present

## 2017-10-30 DIAGNOSIS — S72012D Unspecified intracapsular fracture of left femur, subsequent encounter for closed fracture with routine healing: Secondary | ICD-10-CM | POA: Diagnosis not present

## 2017-10-30 DIAGNOSIS — Z96642 Presence of left artificial hip joint: Secondary | ICD-10-CM | POA: Diagnosis not present

## 2017-10-30 DIAGNOSIS — I482 Chronic atrial fibrillation: Secondary | ICD-10-CM | POA: Diagnosis not present

## 2017-10-30 DIAGNOSIS — K208 Other esophagitis: Secondary | ICD-10-CM | POA: Diagnosis not present

## 2017-10-31 DIAGNOSIS — I10 Essential (primary) hypertension: Secondary | ICD-10-CM | POA: Diagnosis not present

## 2017-10-31 DIAGNOSIS — I482 Chronic atrial fibrillation: Secondary | ICD-10-CM | POA: Diagnosis not present

## 2017-10-31 DIAGNOSIS — K222 Esophageal obstruction: Secondary | ICD-10-CM | POA: Diagnosis not present

## 2017-10-31 DIAGNOSIS — K208 Other esophagitis: Secondary | ICD-10-CM | POA: Diagnosis not present

## 2017-10-31 DIAGNOSIS — S72012D Unspecified intracapsular fracture of left femur, subsequent encounter for closed fracture with routine healing: Secondary | ICD-10-CM | POA: Diagnosis not present

## 2017-10-31 DIAGNOSIS — Z96642 Presence of left artificial hip joint: Secondary | ICD-10-CM | POA: Diagnosis not present

## 2017-11-01 ENCOUNTER — Encounter (INDEPENDENT_AMBULATORY_CARE_PROVIDER_SITE_OTHER): Payer: Medicare Other | Admitting: Ophthalmology

## 2017-11-01 DIAGNOSIS — H353122 Nonexudative age-related macular degeneration, left eye, intermediate dry stage: Secondary | ICD-10-CM | POA: Diagnosis not present

## 2017-11-01 DIAGNOSIS — I10 Essential (primary) hypertension: Secondary | ICD-10-CM | POA: Diagnosis not present

## 2017-11-01 DIAGNOSIS — H353211 Exudative age-related macular degeneration, right eye, with active choroidal neovascularization: Secondary | ICD-10-CM | POA: Diagnosis not present

## 2017-11-01 DIAGNOSIS — H35033 Hypertensive retinopathy, bilateral: Secondary | ICD-10-CM | POA: Diagnosis not present

## 2017-11-01 DIAGNOSIS — H43813 Vitreous degeneration, bilateral: Secondary | ICD-10-CM

## 2017-11-06 DIAGNOSIS — Z96642 Presence of left artificial hip joint: Secondary | ICD-10-CM | POA: Diagnosis not present

## 2017-11-06 DIAGNOSIS — K222 Esophageal obstruction: Secondary | ICD-10-CM | POA: Diagnosis not present

## 2017-11-06 DIAGNOSIS — S72012D Unspecified intracapsular fracture of left femur, subsequent encounter for closed fracture with routine healing: Secondary | ICD-10-CM | POA: Diagnosis not present

## 2017-11-06 DIAGNOSIS — I482 Chronic atrial fibrillation: Secondary | ICD-10-CM | POA: Diagnosis not present

## 2017-11-06 DIAGNOSIS — K208 Other esophagitis: Secondary | ICD-10-CM | POA: Diagnosis not present

## 2017-11-06 DIAGNOSIS — I10 Essential (primary) hypertension: Secondary | ICD-10-CM | POA: Diagnosis not present

## 2017-11-07 DIAGNOSIS — I4891 Unspecified atrial fibrillation: Secondary | ICD-10-CM | POA: Diagnosis not present

## 2017-11-07 DIAGNOSIS — Z7901 Long term (current) use of anticoagulants: Secondary | ICD-10-CM | POA: Diagnosis not present

## 2017-11-15 DIAGNOSIS — Z96642 Presence of left artificial hip joint: Secondary | ICD-10-CM | POA: Diagnosis not present

## 2017-11-15 DIAGNOSIS — Z471 Aftercare following joint replacement surgery: Secondary | ICD-10-CM | POA: Diagnosis not present

## 2017-11-20 DIAGNOSIS — I4891 Unspecified atrial fibrillation: Secondary | ICD-10-CM | POA: Diagnosis not present

## 2017-11-20 DIAGNOSIS — Z7901 Long term (current) use of anticoagulants: Secondary | ICD-10-CM | POA: Diagnosis not present

## 2017-12-19 DIAGNOSIS — Z7901 Long term (current) use of anticoagulants: Secondary | ICD-10-CM | POA: Diagnosis not present

## 2017-12-19 DIAGNOSIS — I4891 Unspecified atrial fibrillation: Secondary | ICD-10-CM | POA: Diagnosis not present

## 2018-01-16 DIAGNOSIS — I4891 Unspecified atrial fibrillation: Secondary | ICD-10-CM | POA: Diagnosis not present

## 2018-01-16 DIAGNOSIS — Z7901 Long term (current) use of anticoagulants: Secondary | ICD-10-CM | POA: Diagnosis not present

## 2018-01-17 ENCOUNTER — Encounter: Payer: Self-pay | Admitting: Podiatry

## 2018-01-17 ENCOUNTER — Encounter (INDEPENDENT_AMBULATORY_CARE_PROVIDER_SITE_OTHER): Payer: Medicare Other | Admitting: Ophthalmology

## 2018-01-17 ENCOUNTER — Ambulatory Visit (INDEPENDENT_AMBULATORY_CARE_PROVIDER_SITE_OTHER): Payer: Medicare Other | Admitting: Podiatry

## 2018-01-17 DIAGNOSIS — D689 Coagulation defect, unspecified: Secondary | ICD-10-CM

## 2018-01-17 DIAGNOSIS — B351 Tinea unguium: Secondary | ICD-10-CM | POA: Diagnosis not present

## 2018-01-17 DIAGNOSIS — H35033 Hypertensive retinopathy, bilateral: Secondary | ICD-10-CM

## 2018-01-17 DIAGNOSIS — I10 Essential (primary) hypertension: Secondary | ICD-10-CM

## 2018-01-17 DIAGNOSIS — H353122 Nonexudative age-related macular degeneration, left eye, intermediate dry stage: Secondary | ICD-10-CM

## 2018-01-17 DIAGNOSIS — M79676 Pain in unspecified toe(s): Secondary | ICD-10-CM

## 2018-01-17 DIAGNOSIS — H43813 Vitreous degeneration, bilateral: Secondary | ICD-10-CM | POA: Diagnosis not present

## 2018-01-17 DIAGNOSIS — H353211 Exudative age-related macular degeneration, right eye, with active choroidal neovascularization: Secondary | ICD-10-CM | POA: Diagnosis not present

## 2018-01-17 NOTE — Progress Notes (Signed)
Complaint:  Visit Type: Patient returns to my office for continued preventative foot care services. Complaint: Patient states" my nails have grown long and thick and become painful to walk and wear shoes" Patient has been taking coumadin.. The patient presents for preventative foot care services. No changes to ROS  Podiatric Exam: Vascular: dorsalis pedis and posterior tibial pulses are palpable bilateral. Capillary return is immediate. Temperature gradient is WNL. Skin turgor WNL  Sensorium: Normal Semmes Weinstein monofilament test. Normal tactile sensation bilaterally. Nail Exam: Pt has thick disfigured discolored nails with subungual debris noted bilateral entire nail hallux through fifth toenails Ulcer Exam: There is no evidence of ulcer or pre-ulcerative changes or infection. Orthopedic Exam: Muscle tone and strength are WNL. No limitations in general ROM. No crepitus or effusions noted. Foot type and digits show no abnormalities. Bony prominences are unremarkable. Skin: No Porokeratosis. No infection or ulcers  Diagnosis:  Onychomycosis, , Pain in right toe, pain in left toes  Treatment & Plan Procedures and Treatment: Consent by patient was obtained for treatment procedures.   Debridement of mycotic and hypertrophic toenails, 1 through 5 bilateral and clearing of subungual debris. No ulceration, no infection noted.  Return Visit-Office Procedure: Patient instructed to return to the office for a follow up visit 3 months for continued evaluation and treatment.    Gardiner Barefoot DPM

## 2018-02-03 DIAGNOSIS — I4891 Unspecified atrial fibrillation: Secondary | ICD-10-CM | POA: Diagnosis not present

## 2018-02-03 DIAGNOSIS — Z7901 Long term (current) use of anticoagulants: Secondary | ICD-10-CM | POA: Diagnosis not present

## 2018-03-10 DIAGNOSIS — I4891 Unspecified atrial fibrillation: Secondary | ICD-10-CM | POA: Diagnosis not present

## 2018-03-10 DIAGNOSIS — Z7901 Long term (current) use of anticoagulants: Secondary | ICD-10-CM | POA: Diagnosis not present

## 2018-03-20 DIAGNOSIS — Z85828 Personal history of other malignant neoplasm of skin: Secondary | ICD-10-CM | POA: Diagnosis not present

## 2018-03-20 DIAGNOSIS — D1801 Hemangioma of skin and subcutaneous tissue: Secondary | ICD-10-CM | POA: Diagnosis not present

## 2018-03-20 DIAGNOSIS — L821 Other seborrheic keratosis: Secondary | ICD-10-CM | POA: Diagnosis not present

## 2018-03-20 DIAGNOSIS — D3617 Benign neoplasm of peripheral nerves and autonomic nervous system of trunk, unspecified: Secondary | ICD-10-CM | POA: Diagnosis not present

## 2018-03-20 DIAGNOSIS — M7981 Nontraumatic hematoma of soft tissue: Secondary | ICD-10-CM | POA: Diagnosis not present

## 2018-03-20 DIAGNOSIS — L570 Actinic keratosis: Secondary | ICD-10-CM | POA: Diagnosis not present

## 2018-04-10 DIAGNOSIS — I4891 Unspecified atrial fibrillation: Secondary | ICD-10-CM | POA: Diagnosis not present

## 2018-04-10 DIAGNOSIS — Z7901 Long term (current) use of anticoagulants: Secondary | ICD-10-CM | POA: Diagnosis not present

## 2018-04-11 ENCOUNTER — Encounter (INDEPENDENT_AMBULATORY_CARE_PROVIDER_SITE_OTHER): Payer: Medicare Other | Admitting: Ophthalmology

## 2018-04-11 DIAGNOSIS — I10 Essential (primary) hypertension: Secondary | ICD-10-CM

## 2018-04-11 DIAGNOSIS — H35033 Hypertensive retinopathy, bilateral: Secondary | ICD-10-CM | POA: Diagnosis not present

## 2018-04-11 DIAGNOSIS — H353122 Nonexudative age-related macular degeneration, left eye, intermediate dry stage: Secondary | ICD-10-CM | POA: Diagnosis not present

## 2018-04-11 DIAGNOSIS — H43813 Vitreous degeneration, bilateral: Secondary | ICD-10-CM

## 2018-04-11 DIAGNOSIS — H353211 Exudative age-related macular degeneration, right eye, with active choroidal neovascularization: Secondary | ICD-10-CM

## 2018-04-16 ENCOUNTER — Encounter: Payer: Self-pay | Admitting: Podiatry

## 2018-04-16 ENCOUNTER — Ambulatory Visit (INDEPENDENT_AMBULATORY_CARE_PROVIDER_SITE_OTHER): Payer: Medicare Other | Admitting: Podiatry

## 2018-04-16 DIAGNOSIS — D689 Coagulation defect, unspecified: Secondary | ICD-10-CM

## 2018-04-16 DIAGNOSIS — B351 Tinea unguium: Secondary | ICD-10-CM | POA: Diagnosis not present

## 2018-04-16 NOTE — Progress Notes (Signed)
Complaint:  Visit Type: Patient returns to my office for continued preventative foot care services. Complaint: Patient states" my nails have grown long and thick and become painful to walk and wear shoes" Patient has been taking coumadin.. The patient presents for preventative foot care services. No changes to ROS  Podiatric Exam: Vascular: dorsalis pedis and posterior tibial pulses are palpable bilateral. Capillary return is immediate. Temperature gradient is WNL. Skin turgor WNL  Sensorium: Normal Semmes Weinstein monofilament test. Normal tactile sensation bilaterally. Nail Exam: Pt has thick disfigured discolored nails with subungual debris noted bilateral entire nail hallux through fifth toenails Ulcer Exam: There is no evidence of ulcer or pre-ulcerative changes or infection. Orthopedic Exam: Muscle tone and strength are WNL. No limitations in general ROM. No crepitus or effusions noted. Foot type and digits show no abnormalities. HAV  B/L. Skin: No Porokeratosis. No infection or ulcers  Diagnosis:  Onychomycosis, , Pain in right toe, pain in left toes  Treatment & Plan Procedures and Treatment: Consent by patient was obtained for treatment procedures.   Debridement of mycotic and hypertrophic toenails, 1 through 5 bilateral and clearing of subungual debris. No ulceration, no infection noted.  Return Visit-Office Procedure: Patient instructed to return to the office for a follow up visit 3 months for continued evaluation and treatment.    Jacilyn Sanpedro DPM 

## 2018-05-08 DIAGNOSIS — Z7901 Long term (current) use of anticoagulants: Secondary | ICD-10-CM | POA: Diagnosis not present

## 2018-05-08 DIAGNOSIS — I4891 Unspecified atrial fibrillation: Secondary | ICD-10-CM | POA: Diagnosis not present

## 2018-06-04 DIAGNOSIS — I4891 Unspecified atrial fibrillation: Secondary | ICD-10-CM | POA: Diagnosis not present

## 2018-06-04 DIAGNOSIS — Z7901 Long term (current) use of anticoagulants: Secondary | ICD-10-CM | POA: Diagnosis not present

## 2018-07-03 DIAGNOSIS — Z7901 Long term (current) use of anticoagulants: Secondary | ICD-10-CM | POA: Diagnosis not present

## 2018-07-03 DIAGNOSIS — I4891 Unspecified atrial fibrillation: Secondary | ICD-10-CM | POA: Diagnosis not present

## 2018-07-04 ENCOUNTER — Encounter (INDEPENDENT_AMBULATORY_CARE_PROVIDER_SITE_OTHER): Payer: Medicare Other | Admitting: Ophthalmology

## 2018-07-04 DIAGNOSIS — H35033 Hypertensive retinopathy, bilateral: Secondary | ICD-10-CM | POA: Diagnosis not present

## 2018-07-04 DIAGNOSIS — H353231 Exudative age-related macular degeneration, bilateral, with active choroidal neovascularization: Secondary | ICD-10-CM

## 2018-07-04 DIAGNOSIS — I10 Essential (primary) hypertension: Secondary | ICD-10-CM | POA: Diagnosis not present

## 2018-07-04 DIAGNOSIS — H43813 Vitreous degeneration, bilateral: Secondary | ICD-10-CM | POA: Diagnosis not present

## 2018-07-08 ENCOUNTER — Encounter: Payer: Self-pay | Admitting: Cardiology

## 2018-07-08 DIAGNOSIS — I4891 Unspecified atrial fibrillation: Secondary | ICD-10-CM | POA: Diagnosis not present

## 2018-07-08 DIAGNOSIS — M069 Rheumatoid arthritis, unspecified: Secondary | ICD-10-CM | POA: Diagnosis not present

## 2018-07-08 DIAGNOSIS — I10 Essential (primary) hypertension: Secondary | ICD-10-CM | POA: Diagnosis not present

## 2018-07-08 DIAGNOSIS — Z0000 Encounter for general adult medical examination without abnormal findings: Secondary | ICD-10-CM | POA: Diagnosis not present

## 2018-07-15 DIAGNOSIS — M150 Primary generalized (osteo)arthritis: Secondary | ICD-10-CM | POA: Diagnosis not present

## 2018-07-15 DIAGNOSIS — I4891 Unspecified atrial fibrillation: Secondary | ICD-10-CM | POA: Diagnosis not present

## 2018-07-15 DIAGNOSIS — E782 Mixed hyperlipidemia: Secondary | ICD-10-CM | POA: Diagnosis not present

## 2018-07-15 DIAGNOSIS — I10 Essential (primary) hypertension: Secondary | ICD-10-CM | POA: Diagnosis not present

## 2018-07-15 DIAGNOSIS — Z23 Encounter for immunization: Secondary | ICD-10-CM | POA: Diagnosis not present

## 2018-07-15 DIAGNOSIS — M069 Rheumatoid arthritis, unspecified: Secondary | ICD-10-CM | POA: Diagnosis not present

## 2018-07-18 ENCOUNTER — Encounter: Payer: Self-pay | Admitting: Podiatry

## 2018-07-18 ENCOUNTER — Ambulatory Visit (INDEPENDENT_AMBULATORY_CARE_PROVIDER_SITE_OTHER): Payer: Medicare Other | Admitting: Podiatry

## 2018-07-18 DIAGNOSIS — B351 Tinea unguium: Secondary | ICD-10-CM | POA: Diagnosis not present

## 2018-07-18 DIAGNOSIS — D689 Coagulation defect, unspecified: Secondary | ICD-10-CM

## 2018-07-18 NOTE — Progress Notes (Signed)
Complaint:  Visit Type: Patient returns to my office for continued preventative foot care services. Complaint: Patient states" my nails have grown long and thick and become painful to walk and wear shoes" Patient has been taking coumadin.. The patient presents for preventative foot care services. No changes to ROS  Podiatric Exam: Vascular: dorsalis pedis and posterior tibial pulses are palpable bilateral. Capillary return is immediate. Temperature gradient is WNL. Skin turgor WNL  Sensorium: Normal Semmes Weinstein monofilament test. Normal tactile sensation bilaterally. Nail Exam: Pt has thick disfigured discolored nails with subungual debris noted bilateral entire nail hallux through fifth toenails Ulcer Exam: There is no evidence of ulcer or pre-ulcerative changes or infection. Orthopedic Exam: Muscle tone and strength are WNL. No limitations in general ROM. No crepitus or effusions noted. Foot type and digits show no abnormalities. HAV  B/L. Skin: No Porokeratosis. No infection or ulcers  Diagnosis:  Onychomycosis, , Pain in right toe, pain in left toes  Treatment & Plan Procedures and Treatment: Consent by patient was obtained for treatment procedures.   Debridement of mycotic and hypertrophic toenails, 1 through 5 bilateral and clearing of subungual debris. No ulceration, no infection noted.  Return Visit-Office Procedure: Patient instructed to return to the office for a follow up visit 3 months for continued evaluation and treatment.    Yarrow Linhart DPM 

## 2018-07-30 ENCOUNTER — Encounter (INDEPENDENT_AMBULATORY_CARE_PROVIDER_SITE_OTHER): Payer: Medicare Other | Admitting: Ophthalmology

## 2018-07-30 DIAGNOSIS — H43813 Vitreous degeneration, bilateral: Secondary | ICD-10-CM

## 2018-07-30 DIAGNOSIS — H353231 Exudative age-related macular degeneration, bilateral, with active choroidal neovascularization: Secondary | ICD-10-CM | POA: Diagnosis not present

## 2018-07-30 DIAGNOSIS — I10 Essential (primary) hypertension: Secondary | ICD-10-CM

## 2018-07-30 DIAGNOSIS — H35033 Hypertensive retinopathy, bilateral: Secondary | ICD-10-CM

## 2018-07-31 DIAGNOSIS — Z7901 Long term (current) use of anticoagulants: Secondary | ICD-10-CM | POA: Diagnosis not present

## 2018-07-31 DIAGNOSIS — I4891 Unspecified atrial fibrillation: Secondary | ICD-10-CM | POA: Diagnosis not present

## 2018-08-28 DIAGNOSIS — I4891 Unspecified atrial fibrillation: Secondary | ICD-10-CM | POA: Diagnosis not present

## 2018-08-28 DIAGNOSIS — Z7901 Long term (current) use of anticoagulants: Secondary | ICD-10-CM | POA: Diagnosis not present

## 2018-08-29 ENCOUNTER — Encounter (INDEPENDENT_AMBULATORY_CARE_PROVIDER_SITE_OTHER): Payer: Medicare Other | Admitting: Ophthalmology

## 2018-09-01 DIAGNOSIS — Z6825 Body mass index (BMI) 25.0-25.9, adult: Secondary | ICD-10-CM | POA: Diagnosis not present

## 2018-09-01 DIAGNOSIS — Z8781 Personal history of (healed) traumatic fracture: Secondary | ICD-10-CM | POA: Diagnosis not present

## 2018-09-01 DIAGNOSIS — E663 Overweight: Secondary | ICD-10-CM | POA: Diagnosis not present

## 2018-09-01 DIAGNOSIS — M0589 Other rheumatoid arthritis with rheumatoid factor of multiple sites: Secondary | ICD-10-CM | POA: Diagnosis not present

## 2018-09-03 ENCOUNTER — Encounter (INDEPENDENT_AMBULATORY_CARE_PROVIDER_SITE_OTHER): Payer: Medicare Other | Admitting: Ophthalmology

## 2018-09-03 DIAGNOSIS — I10 Essential (primary) hypertension: Secondary | ICD-10-CM | POA: Diagnosis not present

## 2018-09-03 DIAGNOSIS — H43813 Vitreous degeneration, bilateral: Secondary | ICD-10-CM

## 2018-09-03 DIAGNOSIS — H353231 Exudative age-related macular degeneration, bilateral, with active choroidal neovascularization: Secondary | ICD-10-CM

## 2018-09-03 DIAGNOSIS — H35033 Hypertensive retinopathy, bilateral: Secondary | ICD-10-CM | POA: Diagnosis not present

## 2018-09-15 DIAGNOSIS — D692 Other nonthrombocytopenic purpura: Secondary | ICD-10-CM | POA: Diagnosis not present

## 2018-09-15 DIAGNOSIS — Z85828 Personal history of other malignant neoplasm of skin: Secondary | ICD-10-CM | POA: Diagnosis not present

## 2018-09-15 DIAGNOSIS — D3617 Benign neoplasm of peripheral nerves and autonomic nervous system of trunk, unspecified: Secondary | ICD-10-CM | POA: Diagnosis not present

## 2018-09-15 DIAGNOSIS — L570 Actinic keratosis: Secondary | ICD-10-CM | POA: Diagnosis not present

## 2018-09-15 DIAGNOSIS — L812 Freckles: Secondary | ICD-10-CM | POA: Diagnosis not present

## 2018-09-15 DIAGNOSIS — D1801 Hemangioma of skin and subcutaneous tissue: Secondary | ICD-10-CM | POA: Diagnosis not present

## 2018-09-15 DIAGNOSIS — L821 Other seborrheic keratosis: Secondary | ICD-10-CM | POA: Diagnosis not present

## 2018-09-25 DIAGNOSIS — Z7901 Long term (current) use of anticoagulants: Secondary | ICD-10-CM | POA: Diagnosis not present

## 2018-09-25 DIAGNOSIS — I4891 Unspecified atrial fibrillation: Secondary | ICD-10-CM | POA: Diagnosis not present

## 2018-10-01 ENCOUNTER — Encounter (INDEPENDENT_AMBULATORY_CARE_PROVIDER_SITE_OTHER): Payer: Medicare Other | Admitting: Ophthalmology

## 2018-10-01 DIAGNOSIS — H43813 Vitreous degeneration, bilateral: Secondary | ICD-10-CM | POA: Diagnosis not present

## 2018-10-01 DIAGNOSIS — H353231 Exudative age-related macular degeneration, bilateral, with active choroidal neovascularization: Secondary | ICD-10-CM | POA: Diagnosis not present

## 2018-10-01 DIAGNOSIS — H35033 Hypertensive retinopathy, bilateral: Secondary | ICD-10-CM | POA: Diagnosis not present

## 2018-10-01 DIAGNOSIS — I10 Essential (primary) hypertension: Secondary | ICD-10-CM | POA: Diagnosis not present

## 2018-10-01 DIAGNOSIS — H26493 Other secondary cataract, bilateral: Secondary | ICD-10-CM

## 2018-10-03 DIAGNOSIS — Z96642 Presence of left artificial hip joint: Secondary | ICD-10-CM | POA: Insufficient documentation

## 2018-10-03 DIAGNOSIS — Z471 Aftercare following joint replacement surgery: Secondary | ICD-10-CM | POA: Diagnosis not present

## 2018-10-14 ENCOUNTER — Encounter (INDEPENDENT_AMBULATORY_CARE_PROVIDER_SITE_OTHER): Payer: Medicare Other | Admitting: Ophthalmology

## 2018-10-14 DIAGNOSIS — H26492 Other secondary cataract, left eye: Secondary | ICD-10-CM | POA: Diagnosis not present

## 2018-10-17 ENCOUNTER — Encounter: Payer: Self-pay | Admitting: Podiatry

## 2018-10-17 ENCOUNTER — Ambulatory Visit (INDEPENDENT_AMBULATORY_CARE_PROVIDER_SITE_OTHER): Payer: Medicare Other | Admitting: Podiatry

## 2018-10-17 DIAGNOSIS — D689 Coagulation defect, unspecified: Secondary | ICD-10-CM | POA: Diagnosis not present

## 2018-10-17 DIAGNOSIS — B351 Tinea unguium: Secondary | ICD-10-CM | POA: Diagnosis not present

## 2018-10-17 NOTE — Progress Notes (Signed)
Complaint:  Visit Type: Patient returns to my office for continued preventative foot care services. Complaint: Patient states" my nails have grown long and thick and become painful to walk and wear shoes" Patient has been taking coumadin.. The patient presents for preventative foot care services. No changes to ROS  Podiatric Exam: Vascular: dorsalis pedis and posterior tibial pulses are palpable bilateral. Capillary return is immediate. Temperature gradient is WNL. Skin turgor WNL  Sensorium: Normal Semmes Weinstein monofilament test. Normal tactile sensation bilaterally. Nail Exam: Pt has thick disfigured discolored nails with subungual debris noted bilateral entire nail hallux through fifth toenails Ulcer Exam: There is no evidence of ulcer or pre-ulcerative changes or infection. Orthopedic Exam: Muscle tone and strength are WNL. No limitations in general ROM. No crepitus or effusions noted. Foot type and digits show no abnormalities. HAV  B/L. Skin: No Porokeratosis. No infection or ulcers  Diagnosis:  Onychomycosis, , Pain in right toe, pain in left toes  Treatment & Plan Procedures and Treatment: Consent by patient was obtained for treatment procedures.   Debridement of mycotic and hypertrophic toenails, 1 through 5 bilateral and clearing of subungual debris. No ulceration, no infection noted.  Return Visit-Office Procedure: Patient instructed to return to the office for a follow up visit 3 months for continued evaluation and treatment.    Gardiner Barefoot DPM

## 2018-10-23 DIAGNOSIS — I10 Essential (primary) hypertension: Secondary | ICD-10-CM | POA: Diagnosis not present

## 2018-10-23 DIAGNOSIS — Z7901 Long term (current) use of anticoagulants: Secondary | ICD-10-CM | POA: Diagnosis not present

## 2018-10-23 DIAGNOSIS — I4891 Unspecified atrial fibrillation: Secondary | ICD-10-CM | POA: Diagnosis not present

## 2018-10-29 ENCOUNTER — Encounter (INDEPENDENT_AMBULATORY_CARE_PROVIDER_SITE_OTHER): Payer: Medicare Other | Admitting: Ophthalmology

## 2018-10-29 DIAGNOSIS — H43813 Vitreous degeneration, bilateral: Secondary | ICD-10-CM

## 2018-10-29 DIAGNOSIS — I10 Essential (primary) hypertension: Secondary | ICD-10-CM | POA: Diagnosis not present

## 2018-10-29 DIAGNOSIS — H26491 Other secondary cataract, right eye: Secondary | ICD-10-CM

## 2018-10-29 DIAGNOSIS — H35033 Hypertensive retinopathy, bilateral: Secondary | ICD-10-CM

## 2018-10-29 DIAGNOSIS — H353231 Exudative age-related macular degeneration, bilateral, with active choroidal neovascularization: Secondary | ICD-10-CM

## 2018-11-20 DIAGNOSIS — I4891 Unspecified atrial fibrillation: Secondary | ICD-10-CM | POA: Diagnosis not present

## 2018-11-20 DIAGNOSIS — Z7901 Long term (current) use of anticoagulants: Secondary | ICD-10-CM | POA: Diagnosis not present

## 2018-11-26 ENCOUNTER — Encounter (INDEPENDENT_AMBULATORY_CARE_PROVIDER_SITE_OTHER): Payer: Medicare Other | Admitting: Ophthalmology

## 2018-11-26 DIAGNOSIS — H35033 Hypertensive retinopathy, bilateral: Secondary | ICD-10-CM

## 2018-11-26 DIAGNOSIS — H353231 Exudative age-related macular degeneration, bilateral, with active choroidal neovascularization: Secondary | ICD-10-CM | POA: Diagnosis not present

## 2018-11-26 DIAGNOSIS — I10 Essential (primary) hypertension: Secondary | ICD-10-CM | POA: Diagnosis not present

## 2018-11-26 DIAGNOSIS — H43813 Vitreous degeneration, bilateral: Secondary | ICD-10-CM

## 2018-12-22 DIAGNOSIS — I4891 Unspecified atrial fibrillation: Secondary | ICD-10-CM | POA: Diagnosis not present

## 2018-12-22 DIAGNOSIS — Z7901 Long term (current) use of anticoagulants: Secondary | ICD-10-CM | POA: Diagnosis not present

## 2018-12-31 ENCOUNTER — Encounter (INDEPENDENT_AMBULATORY_CARE_PROVIDER_SITE_OTHER): Payer: Medicare Other | Admitting: Ophthalmology

## 2018-12-31 DIAGNOSIS — H35033 Hypertensive retinopathy, bilateral: Secondary | ICD-10-CM | POA: Diagnosis not present

## 2018-12-31 DIAGNOSIS — H43813 Vitreous degeneration, bilateral: Secondary | ICD-10-CM | POA: Diagnosis not present

## 2018-12-31 DIAGNOSIS — H353231 Exudative age-related macular degeneration, bilateral, with active choroidal neovascularization: Secondary | ICD-10-CM

## 2018-12-31 DIAGNOSIS — I10 Essential (primary) hypertension: Secondary | ICD-10-CM | POA: Diagnosis not present

## 2019-01-12 ENCOUNTER — Ambulatory Visit: Payer: Medicare Other | Admitting: Cardiovascular Disease

## 2019-01-16 ENCOUNTER — Encounter: Payer: Self-pay | Admitting: Podiatry

## 2019-01-16 ENCOUNTER — Ambulatory Visit (INDEPENDENT_AMBULATORY_CARE_PROVIDER_SITE_OTHER): Payer: Medicare Other | Admitting: Podiatry

## 2019-01-16 DIAGNOSIS — B351 Tinea unguium: Secondary | ICD-10-CM | POA: Diagnosis not present

## 2019-01-16 DIAGNOSIS — M79676 Pain in unspecified toe(s): Secondary | ICD-10-CM | POA: Diagnosis not present

## 2019-01-16 DIAGNOSIS — D689 Coagulation defect, unspecified: Secondary | ICD-10-CM

## 2019-01-16 NOTE — Progress Notes (Signed)
Complaint:  Visit Type: Patient returns to my office for continued preventative foot care services. Complaint: Patient states" my nails have grown long and thick and become painful to walk and wear shoes" Patient has been taking coumadin.. The patient presents for preventative foot care services. No changes to ROS  Podiatric Exam: Vascular: dorsalis pedis and posterior tibial pulses are palpable bilateral. Capillary return is immediate. Temperature gradient is WNL. Skin turgor WNL  Sensorium: Normal Semmes Weinstein monofilament test. Normal tactile sensation bilaterally. Nail Exam: Pt has thick disfigured discolored nails with subungual debris noted bilateral entire nail hallux through fifth toenails Ulcer Exam: There is no evidence of ulcer or pre-ulcerative changes or infection. Orthopedic Exam: Muscle tone and strength are WNL. No limitations in general ROM. No crepitus or effusions noted. Foot type and digits show no abnormalities. HAV  B/L. Skin: No Porokeratosis. No infection or ulcers  Diagnosis:  Onychomycosis, , Pain in right toe, pain in left toes  Treatment & Plan Procedures and Treatment: Consent by patient was obtained for treatment procedures.   Debridement of mycotic and hypertrophic toenails, 1 through 5 bilateral and clearing of subungual debris. No ulceration, no infection noted.  Return Visit-Office Procedure: Patient instructed to return to the office for a follow up visit 10 months  for continued evaluation and treatment.    Prabhleen Montemayor DPM 

## 2019-01-19 DIAGNOSIS — I4891 Unspecified atrial fibrillation: Secondary | ICD-10-CM | POA: Diagnosis not present

## 2019-01-19 DIAGNOSIS — Z7901 Long term (current) use of anticoagulants: Secondary | ICD-10-CM | POA: Diagnosis not present

## 2019-01-30 ENCOUNTER — Telehealth: Payer: Self-pay | Admitting: Cardiology

## 2019-01-30 NOTE — Telephone Encounter (Signed)
I spoke with the patient, he is stable, apparently a pt of Dr Thurman Coyer to be established with [place].  He wants to reschedule his OV at a later date.  I will send this through the COVID cancel pathway.  Kerin Ransom PA-C 01/30/2019 4:32 PM

## 2019-01-30 NOTE — Telephone Encounter (Signed)
New Message:   Pt is a new pt, he has on appointment on 02-05-19 with Dr Percival Spanish. Pt wants to know if he needs to come for this appt?

## 2019-01-30 NOTE — Telephone Encounter (Signed)
Lurena Joiner can you answer this for this patient.  By the 26th we will be able to have virtual visits (by virtual visits I mean they might be phone, or video visits.  We are very close to working out these details.)

## 2019-01-30 NOTE — Telephone Encounter (Signed)
   Primary Cardiologist:  Dr Percival Spanish  Patient contacted.  History reviewed.  No symptoms to suggest any unstable cardiac conditions.  Based on discussion, with current pandemic situation, we will be postponing this appointment for [facility].  If symptoms change, he has been instructed to contact our office.   Routing to C19 CANCEL pool for tracking (P CV DIV CV19 CANCEL) and assigning priority (1 = 4-6 wks, 2 = 6-12 wks, 3 = >12 wks).  Kerin Ransom, [place]  01/30/2019 4:33 PM         .

## 2019-01-30 NOTE — Telephone Encounter (Signed)
Will send to Dr Percival Spanish for review .Adonis Housekeeper

## 2019-02-04 ENCOUNTER — Encounter (INDEPENDENT_AMBULATORY_CARE_PROVIDER_SITE_OTHER): Payer: Medicare Other | Admitting: Ophthalmology

## 2019-02-04 ENCOUNTER — Other Ambulatory Visit: Payer: Self-pay

## 2019-02-04 DIAGNOSIS — H35033 Hypertensive retinopathy, bilateral: Secondary | ICD-10-CM | POA: Diagnosis not present

## 2019-02-04 DIAGNOSIS — H353231 Exudative age-related macular degeneration, bilateral, with active choroidal neovascularization: Secondary | ICD-10-CM | POA: Diagnosis not present

## 2019-02-04 DIAGNOSIS — I10 Essential (primary) hypertension: Secondary | ICD-10-CM | POA: Diagnosis not present

## 2019-02-04 DIAGNOSIS — H43813 Vitreous degeneration, bilateral: Secondary | ICD-10-CM | POA: Diagnosis not present

## 2019-02-05 ENCOUNTER — Ambulatory Visit: Payer: Medicare Other | Admitting: Cardiology

## 2019-02-16 DIAGNOSIS — Z7901 Long term (current) use of anticoagulants: Secondary | ICD-10-CM | POA: Diagnosis not present

## 2019-02-16 DIAGNOSIS — I10 Essential (primary) hypertension: Secondary | ICD-10-CM | POA: Diagnosis not present

## 2019-02-16 DIAGNOSIS — I4891 Unspecified atrial fibrillation: Secondary | ICD-10-CM | POA: Diagnosis not present

## 2019-02-27 NOTE — Telephone Encounter (Signed)
Patient is scheduled with Dr. Percival Spanish for virtual visit on 4/22.

## 2019-03-02 ENCOUNTER — Telehealth: Payer: Self-pay | Admitting: Cardiology

## 2019-03-02 NOTE — Telephone Encounter (Signed)
Home phone/ declined my chart/ virtual consent/ pre reg completed

## 2019-03-02 NOTE — Progress Notes (Signed)
Virtual Visit via Telephone Note   This visit type was conducted due to national recommendations for restrictions regarding the COVID-19 Pandemic (e.g. social distancing) in an effort to limit this patient's exposure and mitigate transmission in our community.  Due to his co-morbid illnesses, this patient is at least at moderate risk for complications without adequate follow up.  This format is felt to be most appropriate for this patient at this time.  The patient did not have access to video technology/had technical difficulties with video requiring transitioning to audio format only (telephone).  All issues noted in this document were discussed and addressed.  No physical exam could be performed with this format.  Please refer to the patient's chart for his  consent to telehealth for [facility].   Evaluation Performed:  Follow-up visit  Date:  03/03/2019   ID:  [name], DOB 12-02-1922, MRN [id number]  Patient Location: Home Provider Location: Home  PCP:  Merrilee Seashore, MD  Cardiologist:  Minus Breeding, MD  Electrophysiologist:  None   Chief Complaint:  Atrial fib   History of Present Illness:    [name] is a 85 y.o. male with atrial fib.  He had been seen by Dr. Wynonia Lawman  He had atrial fib in 2006.   He had cardioversion.  The last EKG that I see from 2018 looks like it was atrial flutter.  However, he has a heart rate in the 60s now.  I suspect he is maintaining sinus rhythm though I do not see a more recent EKG.  Regardless he does not feel any tachypalpitations.  He has no problems with his anticoagulation.  He does not have any presyncope or syncope.  He has no chest pressure, neck or arm discomfort.  He has no shortness of breath.  He gets around with a cane or a walker.  He lives with his wife and sounds like he is very independent.  Of note he had an echo in 2018 that was unremarkable.  The patient does not have symptoms concerning for COVID-19 infection  (fever, chills, cough, or new shortness of breath).    Past Medical History:  Diagnosis Date  . A-fib ([place])   . Dysrhythmia    Atrial Tachycardia with block  . Erosive esophagitis   . GERD (gastroesophageal reflux disease)   . H/O fracture of rib    Left  5th, 6th, 7th had a fall in APRIL 2016  . Hypertension   . RA (rheumatoid arthritis) ([place])    Past Surgical History:  Procedure Laterality Date  . BALLOON DILATION N/A 05/04/2015   Procedure: BALLOON DILATION;  Surgeon: Wilford Corner, MD;  Location: [facility] ENDOSCOPY;  Service: Endoscopy;  Laterality: N/A;  janie/melissa  . CATARACT EXTRACTION    . ESOPHAGOGASTRODUODENOSCOPY N/A 03/12/2015   Procedure: ESOPHAGOGASTRODUODENOSCOPY (EGD);  Surgeon: Wilford Corner, MD;  Location: [facility] ENDOSCOPY;  Service: Endoscopy;  Laterality: N/A;  . ESOPHAGOGASTRODUODENOSCOPY N/A 05/04/2015   Procedure: ESOPHAGOGASTRODUODENOSCOPY (EGD);  Surgeon: Wilford Corner, MD;  Location: [facility] ENDOSCOPY;  Service: Endoscopy;  Laterality: N/A;  carm  . EYE SURGERY Bilateral 10/2015  . HIP ARTHROPLASTY Left 09/29/2017   Procedure: ARTHROPLASTY  HIP HEMIARTHROPLASTY left;  Surgeon: Paralee Cancel, MD;  Location: [facility] ORS;  Service: Orthopedics;  Laterality: Left;  . SAVORY DILATION N/A 05/04/2015   Procedure: SAVORY DILATION;  Surgeon: Wilford Corner, MD;  Location: [facility] ENDOSCOPY;  Service: Endoscopy;  Laterality: N/A;     Current Meds  Medication Sig  . diltiazem (TIAZAC) 360  MG 24 hr capsule Take 360 mg by mouth daily.  . metoprolol succinate (TOPROL-XL) 50 MG 24 hr tablet Take 50 mg by mouth daily.   . Multiple Vitamins-Minerals (PRESERVISION AREDS 2) CAPS Take 1 capsule by mouth 2 (two) times daily.  . Omega-3 Fatty Acids (FISH OIL) 1000 MG CAPS Take 1 capsule (1,000 mg total) by mouth 2 (two) times daily.  Marland Kitchen warfarin (COUMADIN) 2 MG tablet TAKE 1 TABLET BY MOUTH EVERY DAY EXCEPT ON WEDNESDAY TAKE 2 TABS     Allergies:   Shellfish allergy; Sulfa antibiotics;  and Fish allergy   Social History   Tobacco Use  . Smoking status: Light Tobacco Smoker    Types: Cigars  . Smokeless tobacco: Never Used  Substance Use Topics  . Alcohol use: No    Comment: April 21, 2005 last drink  . Drug use: No     Family Hx: The patient's family history includes Stroke in his mother.  ROS:   Please see the history of present illness.    Positive for macular degeneration. All other systems reviewed and are negative.   Prior CV studies:   The following studies were reviewed today:  Echo 2018  Labs/Other Tests and Data Reviewed:    EKG: 09/30/2017 atrial flutter with 2 1 conduction rate 117 atypical  Recent Labs: No results found for requested labs within last 8760 hours.   Recent Lipid Panel No results found for: CHOL, TRIG, HDL, CHOLHDL, LDLCALC, LDLDIRECT  Wt Readings from Last 3 Encounters:  03/03/19 175 lb (79.4 kg)  10/18/17 184 lb (83.5 kg)  09/28/17 181 lb 3.5 oz (82.2 kg)     Objective:    Vital Signs:  BP 128/72   Pulse 66   Ht 6' (1.829 m)   Wt 175 lb (79.4 kg)   BMI 23.73 kg/m      ASSESSMENT & PLAN:    ATRIAL FIB: I suspect he is in sinus rhythm.  He seems to tolerate his anticoagulation he has no symptoms.  At this point no change in therapy.  He has his Coumadin followed by his primary provider and he says he is up-to-date with this.  AI: This was mild on his echo in 2018 and I will follow this clinically.  COVID-19 Education: The signs and symptoms of COVID-19 were discussed with the patient and how to seek care for testing (follow up with PCP or arrange E-visit).  The importance of social distancing was discussed today.  Time:   Today, I have spent 26 minutes with the patient with telehealth technology discussing the above problems.     Medication Adjustments/Labs and Tests Ordered: Current medicines are reviewed at length with the patient today.  Concerns regarding medicines are outlined above.   Tests  Ordered: No orders of the defined types were placed in this encounter.   Medication Changes: No orders of the defined types were placed in this encounter.   Disposition:  Follow up August in the office only  Signed, Minus Breeding, MD  03/03/2019 10:20 AM    [place]

## 2019-03-03 ENCOUNTER — Encounter: Payer: Self-pay | Admitting: Cardiology

## 2019-03-03 ENCOUNTER — Telehealth (INDEPENDENT_AMBULATORY_CARE_PROVIDER_SITE_OTHER): Payer: Medicare Other | Admitting: Cardiology

## 2019-03-03 VITALS — BP 128/72 | HR 66 | Ht 72.0 in | Wt 175.0 lb

## 2019-03-03 DIAGNOSIS — I4819 Other persistent atrial fibrillation: Secondary | ICD-10-CM | POA: Insufficient documentation

## 2019-03-03 DIAGNOSIS — I4891 Unspecified atrial fibrillation: Secondary | ICD-10-CM

## 2019-03-03 DIAGNOSIS — I351 Nonrheumatic aortic (valve) insufficiency: Secondary | ICD-10-CM | POA: Insufficient documentation

## 2019-03-03 DIAGNOSIS — Z7189 Other specified counseling: Secondary | ICD-10-CM | POA: Diagnosis not present

## 2019-03-03 NOTE — Patient Instructions (Signed)
Medication Instructions:  Continue current medications  If you need a refill on your cardiac medications before your next appointment, please call your pharmacy.  Labwork: None Ordered .  Testing/Procedures: None Ordered  Follow-Up: You will need a follow up appointment in 4 months.  Please call our office 2 months in advance to schedule this appointment.  You may see James Hochrein, MD or one of the following Advanced Practice Providers on your designated Care Team:   Rhonda Barrett, PA-C . Kathryn Lawrence, DNP, ANP    At [facility], you and your health needs are our priority.  As part of our continuing mission to provide you with exceptional heart care, we have created designated Provider Care Teams.  These Care Teams include your primary Cardiologist (physician) and Advanced Practice Providers (APPs -  Physician Assistants and Nurse Practitioners) who all work together to provide you with the care you need, when you need it.  Thank you for choosing [facility] at [facility]!!     

## 2019-03-09 DIAGNOSIS — I4891 Unspecified atrial fibrillation: Secondary | ICD-10-CM | POA: Diagnosis not present

## 2019-03-09 DIAGNOSIS — Z7901 Long term (current) use of anticoagulants: Secondary | ICD-10-CM | POA: Diagnosis not present

## 2019-03-09 DIAGNOSIS — I10 Essential (primary) hypertension: Secondary | ICD-10-CM | POA: Diagnosis not present

## 2019-03-11 ENCOUNTER — Encounter (INDEPENDENT_AMBULATORY_CARE_PROVIDER_SITE_OTHER): Payer: Medicare Other | Admitting: Ophthalmology

## 2019-03-13 ENCOUNTER — Other Ambulatory Visit: Payer: Self-pay

## 2019-03-13 ENCOUNTER — Encounter (INDEPENDENT_AMBULATORY_CARE_PROVIDER_SITE_OTHER): Payer: Medicare Other | Admitting: Ophthalmology

## 2019-03-13 DIAGNOSIS — H35033 Hypertensive retinopathy, bilateral: Secondary | ICD-10-CM | POA: Diagnosis not present

## 2019-03-13 DIAGNOSIS — H43813 Vitreous degeneration, bilateral: Secondary | ICD-10-CM | POA: Diagnosis not present

## 2019-03-13 DIAGNOSIS — I10 Essential (primary) hypertension: Secondary | ICD-10-CM

## 2019-03-13 DIAGNOSIS — H353231 Exudative age-related macular degeneration, bilateral, with active choroidal neovascularization: Secondary | ICD-10-CM | POA: Diagnosis not present

## 2019-04-03 ENCOUNTER — Encounter: Payer: Self-pay | Admitting: Podiatry

## 2019-04-03 ENCOUNTER — Other Ambulatory Visit: Payer: Self-pay

## 2019-04-03 ENCOUNTER — Ambulatory Visit (INDEPENDENT_AMBULATORY_CARE_PROVIDER_SITE_OTHER): Payer: Medicare Other | Admitting: Podiatry

## 2019-04-03 VITALS — Temp 98.1°F

## 2019-04-03 DIAGNOSIS — B351 Tinea unguium: Secondary | ICD-10-CM | POA: Diagnosis not present

## 2019-04-03 DIAGNOSIS — D689 Coagulation defect, unspecified: Secondary | ICD-10-CM | POA: Diagnosis not present

## 2019-04-03 NOTE — Progress Notes (Signed)
Complaint:  Visit Type: Patient returns to my office for continued preventative foot care services. Complaint: Patient states" my nails have grown long and thick and become painful to walk and wear shoes" Patient has been taking coumadin.. The patient presents for preventative foot care services. No changes to ROS  Podiatric Exam: Vascular: dorsalis pedis and posterior tibial pulses are palpable bilateral. Capillary return is immediate. Temperature gradient is WNL. Skin turgor WNL  Sensorium: Normal Semmes Weinstein monofilament test. Normal tactile sensation bilaterally. Nail Exam: Pt has thick disfigured discolored nails with subungual debris noted bilateral entire nail hallux through fifth toenails Ulcer Exam: There is no evidence of ulcer or pre-ulcerative changes or infection. Orthopedic Exam: Muscle tone and strength are WNL. No limitations in general ROM. No crepitus or effusions noted. Foot type and digits show no abnormalities. HAV  B/L. Skin: No Porokeratosis. No infection or ulcers  Diagnosis:  Onychomycosis, , Pain in right toe, pain in left toes  Treatment & Plan Procedures and Treatment: Consent by patient was obtained for treatment procedures.   Debridement of mycotic and hypertrophic toenails, 1 through 5 bilateral and clearing of subungual debris. No ulceration, no infection noted.  Return Visit-Office Procedure: Patient instructed to return to the office for a follow up visit 10 months  for continued evaluation and treatment.    Gardiner Barefoot DPM

## 2019-04-07 DIAGNOSIS — I4891 Unspecified atrial fibrillation: Secondary | ICD-10-CM | POA: Diagnosis not present

## 2019-04-07 DIAGNOSIS — I10 Essential (primary) hypertension: Secondary | ICD-10-CM | POA: Diagnosis not present

## 2019-04-07 DIAGNOSIS — Z7901 Long term (current) use of anticoagulants: Secondary | ICD-10-CM | POA: Diagnosis not present

## 2019-04-14 DIAGNOSIS — D692 Other nonthrombocytopenic purpura: Secondary | ICD-10-CM | POA: Diagnosis not present

## 2019-04-14 DIAGNOSIS — L723 Sebaceous cyst: Secondary | ICD-10-CM | POA: Diagnosis not present

## 2019-04-14 DIAGNOSIS — Z85828 Personal history of other malignant neoplasm of skin: Secondary | ICD-10-CM | POA: Diagnosis not present

## 2019-04-14 DIAGNOSIS — L570 Actinic keratosis: Secondary | ICD-10-CM | POA: Diagnosis not present

## 2019-04-14 DIAGNOSIS — D3617 Benign neoplasm of peripheral nerves and autonomic nervous system of trunk, unspecified: Secondary | ICD-10-CM | POA: Diagnosis not present

## 2019-04-14 DIAGNOSIS — L821 Other seborrheic keratosis: Secondary | ICD-10-CM | POA: Diagnosis not present

## 2019-04-23 ENCOUNTER — Other Ambulatory Visit: Payer: Self-pay

## 2019-04-23 ENCOUNTER — Encounter (INDEPENDENT_AMBULATORY_CARE_PROVIDER_SITE_OTHER): Payer: Medicare Other | Admitting: Ophthalmology

## 2019-04-23 DIAGNOSIS — H43813 Vitreous degeneration, bilateral: Secondary | ICD-10-CM | POA: Diagnosis not present

## 2019-04-23 DIAGNOSIS — H353231 Exudative age-related macular degeneration, bilateral, with active choroidal neovascularization: Secondary | ICD-10-CM

## 2019-04-23 DIAGNOSIS — I10 Essential (primary) hypertension: Secondary | ICD-10-CM

## 2019-04-23 DIAGNOSIS — H35033 Hypertensive retinopathy, bilateral: Secondary | ICD-10-CM | POA: Diagnosis not present

## 2019-05-05 DIAGNOSIS — Z7901 Long term (current) use of anticoagulants: Secondary | ICD-10-CM | POA: Diagnosis not present

## 2019-05-05 DIAGNOSIS — I4891 Unspecified atrial fibrillation: Secondary | ICD-10-CM | POA: Diagnosis not present

## 2019-05-05 DIAGNOSIS — I10 Essential (primary) hypertension: Secondary | ICD-10-CM | POA: Diagnosis not present

## 2019-05-21 ENCOUNTER — Other Ambulatory Visit: Payer: Self-pay | Admitting: Internal Medicine

## 2019-06-02 DIAGNOSIS — Z7901 Long term (current) use of anticoagulants: Secondary | ICD-10-CM | POA: Diagnosis not present

## 2019-06-02 DIAGNOSIS — I10 Essential (primary) hypertension: Secondary | ICD-10-CM | POA: Diagnosis not present

## 2019-06-02 DIAGNOSIS — I4891 Unspecified atrial fibrillation: Secondary | ICD-10-CM | POA: Diagnosis not present

## 2019-06-04 ENCOUNTER — Encounter (INDEPENDENT_AMBULATORY_CARE_PROVIDER_SITE_OTHER): Payer: Medicare Other | Admitting: Ophthalmology

## 2019-06-04 ENCOUNTER — Other Ambulatory Visit: Payer: Self-pay

## 2019-06-04 DIAGNOSIS — H43813 Vitreous degeneration, bilateral: Secondary | ICD-10-CM | POA: Diagnosis not present

## 2019-06-04 DIAGNOSIS — H35033 Hypertensive retinopathy, bilateral: Secondary | ICD-10-CM | POA: Diagnosis not present

## 2019-06-04 DIAGNOSIS — H353231 Exudative age-related macular degeneration, bilateral, with active choroidal neovascularization: Secondary | ICD-10-CM

## 2019-06-04 DIAGNOSIS — I10 Essential (primary) hypertension: Secondary | ICD-10-CM

## 2019-06-12 ENCOUNTER — Ambulatory Visit (INDEPENDENT_AMBULATORY_CARE_PROVIDER_SITE_OTHER): Payer: Medicare Other | Admitting: Podiatry

## 2019-06-12 ENCOUNTER — Encounter: Payer: Self-pay | Admitting: Podiatry

## 2019-06-12 VITALS — Temp 98.7°F

## 2019-06-12 DIAGNOSIS — D689 Coagulation defect, unspecified: Secondary | ICD-10-CM | POA: Diagnosis not present

## 2019-06-12 DIAGNOSIS — B351 Tinea unguium: Secondary | ICD-10-CM | POA: Diagnosis not present

## 2019-06-12 NOTE — Progress Notes (Signed)
Complaint:  Visit Type: Patient returns to my office for continued preventative foot care services. Complaint: Patient states" my nails have grown long and thick and become painful to walk and wear shoes" Patient has been taking coumadin.. The patient presents for preventative foot care services. No changes to ROS  Podiatric Exam: Vascular: dorsalis pedis and posterior tibial pulses are palpable bilateral. Capillary return is immediate. Temperature gradient is WNL. Skin turgor WNL  Sensorium: Normal Semmes Weinstein monofilament test. Normal tactile sensation bilaterally. Nail Exam: Pt has thick disfigured discolored nails with subungual debris noted bilateral entire nail hallux through fifth toenails Ulcer Exam: There is no evidence of ulcer or pre-ulcerative changes or infection. Orthopedic Exam: Muscle tone and strength are WNL. No limitations in general ROM. No crepitus or effusions noted. Foot type and digits show no abnormalities. HAV  B/L. Skin: No Porokeratosis. No infection or ulcers  Diagnosis:  Onychomycosis, , Pain in right toe, pain in left toes  Treatment & Plan Procedures and Treatment: Consent by patient was obtained for treatment procedures.   Debridement of mycotic and hypertrophic toenails, 1 through 5 bilateral and clearing of subungual debris. No ulceration, no infection noted.  Return Visit-Office Procedure: Patient instructed to return to the office for a follow up visit 10 months  for continued evaluation and treatment.    Gardiner Barefoot DPM

## 2019-07-07 ENCOUNTER — Encounter: Payer: Self-pay | Admitting: Cardiovascular Disease

## 2019-07-07 DIAGNOSIS — I4891 Unspecified atrial fibrillation: Secondary | ICD-10-CM | POA: Diagnosis not present

## 2019-07-07 DIAGNOSIS — I10 Essential (primary) hypertension: Secondary | ICD-10-CM | POA: Diagnosis not present

## 2019-07-07 DIAGNOSIS — Z7901 Long term (current) use of anticoagulants: Secondary | ICD-10-CM | POA: Diagnosis not present

## 2019-07-22 ENCOUNTER — Encounter (INDEPENDENT_AMBULATORY_CARE_PROVIDER_SITE_OTHER): Payer: Medicare Other | Admitting: Ophthalmology

## 2019-07-22 ENCOUNTER — Other Ambulatory Visit: Payer: Self-pay

## 2019-07-22 DIAGNOSIS — I10 Essential (primary) hypertension: Secondary | ICD-10-CM

## 2019-07-22 DIAGNOSIS — H35033 Hypertensive retinopathy, bilateral: Secondary | ICD-10-CM | POA: Diagnosis not present

## 2019-07-22 DIAGNOSIS — H353231 Exudative age-related macular degeneration, bilateral, with active choroidal neovascularization: Secondary | ICD-10-CM | POA: Diagnosis not present

## 2019-07-22 DIAGNOSIS — H2511 Age-related nuclear cataract, right eye: Secondary | ICD-10-CM

## 2019-07-22 DIAGNOSIS — H43813 Vitreous degeneration, bilateral: Secondary | ICD-10-CM

## 2019-07-26 NOTE — Progress Notes (Deleted)
Cardiology Office Note   Date:  07/26/2019   ID:  [name], DOB Apr 15, 1923, MRN [id number]  PCP:  Merrilee Seashore, MD  Cardiologist:   Minus Breeding, MD Referring:  ***  No chief complaint on file.     History of Present Illness: [name] is a 85 y.o. male who presents for had been seen by Dr. Wynonia Lawman.   He had atrial fib in 2006. He had cardioversion.   ***  Past Medical History:  Diagnosis Date  . A-fib ([place])   . Dysrhythmia    Atrial Tachycardia with block  . Erosive esophagitis   . GERD (gastroesophageal reflux disease)   . H/O fracture of rib    Left  5th, 6th, 7th had a fall in APRIL 2016  . Hypertension   . RA (rheumatoid arthritis) ([place])     Past Surgical History:  Procedure Laterality Date  . BALLOON DILATION N/A 05/04/2015   Procedure: BALLOON DILATION;  Surgeon: Wilford Corner, MD;  Location: [facility] ENDOSCOPY;  Service: Endoscopy;  Laterality: N/A;  janie/melissa  . CATARACT EXTRACTION    . ESOPHAGOGASTRODUODENOSCOPY N/A 03/12/2015   Procedure: ESOPHAGOGASTRODUODENOSCOPY (EGD);  Surgeon: Wilford Corner, MD;  Location: [facility] ENDOSCOPY;  Service: Endoscopy;  Laterality: N/A;  . ESOPHAGOGASTRODUODENOSCOPY N/A 05/04/2015   Procedure: ESOPHAGOGASTRODUODENOSCOPY (EGD);  Surgeon: Wilford Corner, MD;  Location: [facility] ENDOSCOPY;  Service: Endoscopy;  Laterality: N/A;  carm  . EYE SURGERY Bilateral 10/2015  . HIP ARTHROPLASTY Left 09/29/2017   Procedure: ARTHROPLASTY  HIP HEMIARTHROPLASTY left;  Surgeon: Paralee Cancel, MD;  Location: [facility] ORS;  Service: Orthopedics;  Laterality: Left;  . SAVORY DILATION N/A 05/04/2015   Procedure: SAVORY DILATION;  Surgeon: Wilford Corner, MD;  Location: [facility] ENDOSCOPY;  Service: Endoscopy;  Laterality: N/A;     Current Outpatient Medications  Medication Sig Dispense Refill  . Besifloxacin HCl (BESIVANCE) 0.6 % SUSP Besivance 0.6 % eye drops,suspension    . cephALEXin (KEFLEX) 500 MG capsule cephalexin 500 mg capsule    .  diltiazem (TIAZAC) 360 MG 24 hr capsule Take 360 mg by mouth daily.  0  . docusate sodium (COLACE) 100 MG capsule Take 1 capsule (100 mg total) 2 (two) times daily by mouth. 10 capsule 0  . ferrous sulfate 325 (65 FE) MG tablet Take 1 tablet (325 mg total) 3 (three) times daily after meals by mouth.  3  . HYDROcodone-acetaminophen (NORCO/VICODIN) 5-325 MG tablet Take 1-2 tablets every 6 (six) hours as needed by mouth for moderate pain. 40 tablet 0  . methocarbamol (ROBAXIN) 500 MG tablet Take 1 tablet (500 mg total) every 6 (six) hours as needed by mouth for muscle spasms. 30 tablet 0  . metoprolol succinate (TOPROL-XL) 50 MG 24 hr tablet Take 50 mg by mouth daily.   1  . Multiple Vitamins-Minerals (PRESERVISION AREDS 2) CAPS Take 1 capsule by mouth 2 (two) times daily.    . Omega-3 Fatty Acids (FISH OIL) 1000 MG CAPS Take 1 capsule (1,000 mg total) by mouth 2 (two) times daily.  0  . pantoprazole (PROTONIX) 40 MG tablet Take 1 tablet (40 mg total) by mouth 2 (two) times daily.    . polyethylene glycol (MIRALAX / GLYCOLAX) packet Take 17 g daily as needed by mouth for mild constipation. 14 each 0  . warfarin (COUMADIN) 2 MG tablet TAKE 1 TABLET BY MOUTH EVERY DAY EXCEPT ON WEDNESDAY TAKE 2 TABS  3   No current facility-administered medications for this visit.     Allergies:  Shellfish allergy, Sulfa antibiotics, and Fish allergy    ROS:  Please see the history of present illness.   Otherwise, review of systems are positive for none.   All other systems are reviewed and negative.    PHYSICAL EXAM: VS:  There were no vitals taken for this visit. , BMI There is no height or weight on file to calculate BMI. GENERAL:  Well appearing NECK:  No jugular venous distention, waveform within normal limits, carotid upstroke brisk and symmetric, no bruits, no thyromegaly LUNGS:  Clear to auscultation bilaterally CHEST:  Normal HEART:  PMI not displaced or sustained,S1 and S2 within normal limits, no  S3, no S4, no clicks, no rubs, no murmurs ABD:  Flat, positive bowel sounds normal in frequency in pitch, no bruits, no rebound, no guarding, no midline pulsatile mass, no hepatomegaly, no splenomegaly EXT:  2 plus pulses throughout, no edema, no cyanosis no clubbing   EKG:  EKG is  ***ordered today. ***   Recent Labs: No results found for requested labs within last 8760 hours.    Lipid Panel No results found for: CHOL, TRIG, HDL, CHOLHDL, VLDL, LDLCALC, LDLDIRECT    Wt Readings from Last 3 Encounters:  03/03/19 175 lb (79.4 kg)  10/18/17 184 lb (83.5 kg)  09/28/17 181 lb 3.5 oz (82.2 kg)      Other studies Reviewed: Additional studies/ records that were reviewed today include: Labs on Care Everywhere. *** Review of the above records demonstrates:  Please see elsewhere in the note.     ASSESSMENT AND PLAN:  ATRIAL FIB:      *** He has had no further uspect he is in sinus rhythm.  He seems to tolerate his anticoagulation he has no symptoms.  At this point no change in therapy.  He has his Coumadin followed by his primary provider and he says he is up-to-date with this.  AI:     ***  was mild on his echo in 2018 and I will follow this clinically   Current medicines are reviewed at length with the patient today.  The patient {ACTIONS; HAS/DOES NOT HAVE:19233} concerns regarding medicines.  The following changes have been made:  {PLAN; NO CHANGE:13088:s}  Labs/ tests ordered today include: *** No orders of the defined types were placed in this encounter.    Disposition:   FU with ***    Signed, Minus Breeding, MD  07/26/2019 3:07 PM    [facility]

## 2019-07-27 ENCOUNTER — Ambulatory Visit: Payer: Medicare Other | Admitting: Cardiology

## 2019-07-31 DIAGNOSIS — I10 Essential (primary) hypertension: Secondary | ICD-10-CM | POA: Diagnosis not present

## 2019-07-31 DIAGNOSIS — Z7901 Long term (current) use of anticoagulants: Secondary | ICD-10-CM | POA: Diagnosis not present

## 2019-07-31 DIAGNOSIS — I4891 Unspecified atrial fibrillation: Secondary | ICD-10-CM | POA: Diagnosis not present

## 2019-08-03 DIAGNOSIS — Z7901 Long term (current) use of anticoagulants: Secondary | ICD-10-CM | POA: Diagnosis not present

## 2019-08-03 DIAGNOSIS — I4891 Unspecified atrial fibrillation: Secondary | ICD-10-CM | POA: Diagnosis not present

## 2019-08-03 DIAGNOSIS — I10 Essential (primary) hypertension: Secondary | ICD-10-CM | POA: Diagnosis not present

## 2019-08-04 DIAGNOSIS — Z7901 Long term (current) use of anticoagulants: Secondary | ICD-10-CM | POA: Diagnosis not present

## 2019-08-04 DIAGNOSIS — I4891 Unspecified atrial fibrillation: Secondary | ICD-10-CM | POA: Diagnosis not present

## 2019-08-04 DIAGNOSIS — E782 Mixed hyperlipidemia: Secondary | ICD-10-CM | POA: Diagnosis not present

## 2019-08-04 DIAGNOSIS — I10 Essential (primary) hypertension: Secondary | ICD-10-CM | POA: Diagnosis not present

## 2019-08-11 DIAGNOSIS — Z0000 Encounter for general adult medical examination without abnormal findings: Secondary | ICD-10-CM | POA: Diagnosis not present

## 2019-08-11 DIAGNOSIS — Z23 Encounter for immunization: Secondary | ICD-10-CM | POA: Diagnosis not present

## 2019-08-11 DIAGNOSIS — Z7189 Other specified counseling: Secondary | ICD-10-CM | POA: Diagnosis not present

## 2019-08-11 DIAGNOSIS — M069 Rheumatoid arthritis, unspecified: Secondary | ICD-10-CM | POA: Diagnosis not present

## 2019-08-11 DIAGNOSIS — I4891 Unspecified atrial fibrillation: Secondary | ICD-10-CM | POA: Diagnosis not present

## 2019-08-11 DIAGNOSIS — I10 Essential (primary) hypertension: Secondary | ICD-10-CM | POA: Diagnosis not present

## 2019-08-11 DIAGNOSIS — M150 Primary generalized (osteo)arthritis: Secondary | ICD-10-CM | POA: Diagnosis not present

## 2019-08-11 DIAGNOSIS — E782 Mixed hyperlipidemia: Secondary | ICD-10-CM | POA: Diagnosis not present

## 2019-08-18 ENCOUNTER — Encounter (INDEPENDENT_AMBULATORY_CARE_PROVIDER_SITE_OTHER): Payer: Medicare Other | Admitting: Ophthalmology

## 2019-08-18 DIAGNOSIS — H26491 Other secondary cataract, right eye: Secondary | ICD-10-CM

## 2019-08-24 NOTE — Progress Notes (Addendum)
Cardiology Office Note   Date:  08/25/2019   ID:  [name], DOB [birth year], MRN [id number]  PCP:  Merrilee Seashore, MD  Cardiologist:   Minus Breeding, MD   Chief Complaint  Patient presents with  . Atrial Fibrillation      History of Present Illness: [name] is a 85 y.o. male who presents for had been seen by Dr. Wynonia Lawman.   He had atrial fib in 2006. He had cardioversion.   I saw for the first time via a video visit earlier this year.  He presents for follow-up.  He does well.  Gets around with a walker.  He denies any cardiovascular symptoms.  He was having some trouble recently with his Coumadin level being markedly elevated.  He thought this might of been related to an aberrant batch of his Coumadin and he treated this out at the pharmacy.  He says it is controlled now.  He reports that he is not noticing any palpitations, presyncope or syncope.  He gets no chest pressure, neck or arm discomfort.  He has no new shortness of breath, PND or orthopnea.    Past Medical History:  Diagnosis Date  . A-fib ([name])   . Dysrhythmia    Atrial Tachycardia with block  . Erosive esophagitis   . GERD (gastroesophageal reflux disease)   . H/O fracture of rib    Left  5th, 6th, 7th had a fall in APRIL 2016  . Hypertension   . RA (rheumatoid arthritis) ([place])     Past Surgical History:  Procedure Laterality Date  . BALLOON DILATION N/A 05/04/2015   Procedure: BALLOON DILATION;  Surgeon: Wilford Corner, MD;  Location: [facility] ENDOSCOPY;  Service: Endoscopy;  Laterality: N/A;  janie/melissa  . CATARACT EXTRACTION    . ESOPHAGOGASTRODUODENOSCOPY N/A 03/12/2015   Procedure: ESOPHAGOGASTRODUODENOSCOPY (EGD);  Surgeon: Wilford Corner, MD;  Location: [facility] ENDOSCOPY;  Service: Endoscopy;  Laterality: N/A;  . ESOPHAGOGASTRODUODENOSCOPY N/A 05/04/2015   Procedure: ESOPHAGOGASTRODUODENOSCOPY (EGD);  Surgeon: Wilford Corner, MD;  Location: [facility] ENDOSCOPY;  Service: Endoscopy;  Laterality:  N/A;  carm  . EYE SURGERY Bilateral 10/2015  . HIP ARTHROPLASTY Left 09/29/2017   Procedure: ARTHROPLASTY  HIP HEMIARTHROPLASTY left;  Surgeon: Paralee Cancel, MD;  Location: [facility] ORS;  Service: Orthopedics;  Laterality: Left;  . SAVORY DILATION N/A 05/04/2015   Procedure: SAVORY DILATION;  Surgeon: Wilford Corner, MD;  Location: [facility] ENDOSCOPY;  Service: Endoscopy;  Laterality: N/A;     Current Outpatient Medications  Medication Sig Dispense Refill  . diltiazem (TIAZAC) 360 MG 24 hr capsule Take 360 mg by mouth daily.  0  . metoprolol succinate (TOPROL-XL) 50 MG 24 hr tablet Take 50 mg by mouth daily.   1  . Multiple Vitamins-Minerals (PRESERVISION AREDS 2) CAPS Take 1 capsule by mouth 2 (two) times daily.    . Omega-3 Fatty Acids (FISH OIL) 1000 MG CAPS Take 1 capsule (1,000 mg total) by mouth 2 (two) times daily.  0  . warfarin (COUMADIN) 2 MG tablet TAKE 1 TABLET BY MOUTH EVERY DAY EXCEPT ON WEDNESDAY TAKE 2 TABS  3  . Besifloxacin HCl (BESIVANCE) 0.6 % SUSP Besivance 0.6 % eye drops,suspension    . cephALEXin (KEFLEX) 500 MG capsule cephalexin 500 mg capsule    . docusate sodium (COLACE) 100 MG capsule Take 1 capsule (100 mg total) 2 (two) times daily by mouth. (Patient not taking: Reported on 08/25/2019) 10 capsule 0  . ferrous sulfate 325 (65 FE) MG tablet Take  1 tablet (325 mg total) 3 (three) times daily after meals by mouth. (Patient not taking: Reported on 08/25/2019)  3  . HYDROcodone-acetaminophen (NORCO/VICODIN) 5-325 MG tablet Take 1-2 tablets every 6 (six) hours as needed by mouth for moderate pain. (Patient not taking: Reported on 08/25/2019) 40 tablet 0  . methocarbamol (ROBAXIN) 500 MG tablet Take 1 tablet (500 mg total) every 6 (six) hours as needed by mouth for muscle spasms. (Patient not taking: Reported on 08/25/2019) 30 tablet 0  . pantoprazole (PROTONIX) 40 MG tablet Take 1 tablet (40 mg total) by mouth 2 (two) times daily. (Patient not taking: Reported on 08/25/2019)    .  polyethylene glycol (MIRALAX / GLYCOLAX) packet Take 17 g daily as needed by mouth for mild constipation. (Patient not taking: Reported on 08/25/2019) 14 each 0   No current facility-administered medications for this visit.     Allergies:   Shellfish allergy, Sulfa antibiotics, and Fish allergy    ROS:  Please see the history of present illness.   Otherwise, review of systems are positive for none.   All other systems are reviewed and negative.    PHYSICAL EXAM: VS:  BP 119/66   Pulse (!) 55   Temp (!) 96.9 F (36.1 C)   Ht 5\' 10"  (1.778 m)   Wt 161 lb (73 kg)   SpO2 94%   BMI 23.10 kg/m  , BMI Body mass index is 23.1 kg/m. GENERAL:  Well appearing NECK:  No jugular venous distention, waveform within normal limits, carotid upstroke brisk and symmetric, no bruits, no thyromegaly LUNGS:  Clear to auscultation bilaterally CHEST:  Normal HEART:  PMI not displaced or sustained,S1 and S2 within normal limits, no S3,  no clicks, no rubs, no murmurs ABD:  Flat, positive bowel sounds normal in frequency in pitch, no bruits, no rebound, no guarding, no midline pulsatile mass, no hepatomegaly, no splenomegaly EXT:  2 plus pulses throughout, no edema, no cyanosis no clubbing   EKG:  EKG is  ordered today. Atrial fibrillation, rate 70, axis within normal limits, intervals within normal limits, poor anterior R wave progression.   Recent Labs: No results found for requested labs within last 8760 hours.    Lipid Panel No results found for: CHOL, TRIG, HDL, CHOLHDL, VLDL, LDLCALC, LDLDIRECT    Wt Readings from Last 3 Encounters:  08/25/19 161 lb (73 kg)  03/03/19 175 lb (79.4 kg)  10/18/17 184 lb (83.5 kg)      Other studies Reviewed: Additional studies/ records that were reviewed today include: NA Review of the above records demonstrates:     ASSESSMENT AND PLAN:  ATRIAL FIB:      Of note patient is in fibrillation today and he really does not notice this.  He will continue  with current anticoagulation and rate control.  There is no need for cardioversion.  We talked about this at length.  He has anticoagulation following he tolerates this well.  Has had reasonable rate control.  (Of note unfortunately it looks like his sons device interrogation was scanned into his chart and I am very recently discussed this with him.  We discussed this with his son to clarify and we have notified the device clinic to correct this mistake.   Of note he says he has been somewhat anemic and I will get these labs from his primary provider but this apparently has been chronic.  AI:     This was mild in 2018.  I will follow  this clinically.  No change in therapy.   Current medicines are reviewed at length with the patient today.  The patient does not have concerns regarding medicines.  The following changes have been made:  no change  Labs/ tests ordered today include: None  Orders Placed This Encounter  Procedures  . EKG 12-Lead     Disposition:   FU with me in one year.      Signed, Minus Breeding, MD  08/25/2019 5:05 PM    [place]

## 2019-08-25 ENCOUNTER — Encounter: Payer: Self-pay | Admitting: Cardiology

## 2019-08-25 ENCOUNTER — Other Ambulatory Visit: Payer: Self-pay

## 2019-08-25 ENCOUNTER — Ambulatory Visit (INDEPENDENT_AMBULATORY_CARE_PROVIDER_SITE_OTHER): Payer: Medicare Other | Admitting: Cardiology

## 2019-08-25 VITALS — BP 119/66 | HR 55 | Temp 96.9°F | Ht 70.0 in | Wt 161.0 lb

## 2019-08-25 DIAGNOSIS — I351 Nonrheumatic aortic (valve) insufficiency: Secondary | ICD-10-CM

## 2019-08-25 DIAGNOSIS — I4891 Unspecified atrial fibrillation: Secondary | ICD-10-CM | POA: Diagnosis not present

## 2019-08-25 NOTE — Patient Instructions (Addendum)

## 2019-08-26 ENCOUNTER — Ambulatory Visit: Payer: Medicare Other | Admitting: Podiatry

## 2019-08-31 DIAGNOSIS — Z961 Presence of intraocular lens: Secondary | ICD-10-CM | POA: Diagnosis not present

## 2019-08-31 DIAGNOSIS — H40053 Ocular hypertension, bilateral: Secondary | ICD-10-CM | POA: Diagnosis not present

## 2019-08-31 DIAGNOSIS — H35033 Hypertensive retinopathy, bilateral: Secondary | ICD-10-CM | POA: Diagnosis not present

## 2019-08-31 DIAGNOSIS — H3530 Unspecified macular degeneration: Secondary | ICD-10-CM | POA: Diagnosis not present

## 2019-09-02 DIAGNOSIS — M0589 Other rheumatoid arthritis with rheumatoid factor of multiple sites: Secondary | ICD-10-CM | POA: Diagnosis not present

## 2019-09-02 DIAGNOSIS — Z8781 Personal history of (healed) traumatic fracture: Secondary | ICD-10-CM | POA: Diagnosis not present

## 2019-09-02 DIAGNOSIS — Z6822 Body mass index (BMI) 22.0-22.9, adult: Secondary | ICD-10-CM | POA: Diagnosis not present

## 2019-09-08 DIAGNOSIS — Z7901 Long term (current) use of anticoagulants: Secondary | ICD-10-CM | POA: Diagnosis not present

## 2019-09-08 DIAGNOSIS — I4891 Unspecified atrial fibrillation: Secondary | ICD-10-CM | POA: Diagnosis not present

## 2019-09-08 DIAGNOSIS — R748 Abnormal levels of other serum enzymes: Secondary | ICD-10-CM | POA: Diagnosis not present

## 2019-09-09 ENCOUNTER — Encounter (INDEPENDENT_AMBULATORY_CARE_PROVIDER_SITE_OTHER): Payer: Medicare Other | Admitting: Ophthalmology

## 2019-09-11 DIAGNOSIS — I4891 Unspecified atrial fibrillation: Secondary | ICD-10-CM | POA: Diagnosis not present

## 2019-09-11 DIAGNOSIS — R748 Abnormal levels of other serum enzymes: Secondary | ICD-10-CM | POA: Diagnosis not present

## 2019-09-11 DIAGNOSIS — Z7901 Long term (current) use of anticoagulants: Secondary | ICD-10-CM | POA: Diagnosis not present

## 2019-09-11 DIAGNOSIS — I10 Essential (primary) hypertension: Secondary | ICD-10-CM | POA: Diagnosis not present

## 2019-09-14 DIAGNOSIS — D509 Iron deficiency anemia, unspecified: Secondary | ICD-10-CM | POA: Diagnosis not present

## 2019-09-14 DIAGNOSIS — Z7189 Other specified counseling: Secondary | ICD-10-CM | POA: Diagnosis not present

## 2019-09-14 DIAGNOSIS — D649 Anemia, unspecified: Secondary | ICD-10-CM | POA: Diagnosis not present

## 2019-09-15 ENCOUNTER — Encounter (INDEPENDENT_AMBULATORY_CARE_PROVIDER_SITE_OTHER): Payer: Medicare Other | Admitting: Ophthalmology

## 2019-09-15 DIAGNOSIS — H353231 Exudative age-related macular degeneration, bilateral, with active choroidal neovascularization: Secondary | ICD-10-CM

## 2019-09-15 DIAGNOSIS — I10 Essential (primary) hypertension: Secondary | ICD-10-CM | POA: Diagnosis not present

## 2019-09-15 DIAGNOSIS — H35033 Hypertensive retinopathy, bilateral: Secondary | ICD-10-CM

## 2019-09-15 DIAGNOSIS — H43813 Vitreous degeneration, bilateral: Secondary | ICD-10-CM

## 2019-09-16 ENCOUNTER — Encounter: Payer: Self-pay | Admitting: Podiatry

## 2019-09-16 ENCOUNTER — Other Ambulatory Visit: Payer: Self-pay

## 2019-09-16 ENCOUNTER — Ambulatory Visit (INDEPENDENT_AMBULATORY_CARE_PROVIDER_SITE_OTHER): Payer: Medicare Other | Admitting: Podiatry

## 2019-09-16 DIAGNOSIS — B351 Tinea unguium: Secondary | ICD-10-CM | POA: Diagnosis not present

## 2019-09-16 DIAGNOSIS — H40053 Ocular hypertension, bilateral: Secondary | ICD-10-CM | POA: Diagnosis not present

## 2019-09-16 DIAGNOSIS — D689 Coagulation defect, unspecified: Secondary | ICD-10-CM | POA: Diagnosis not present

## 2019-09-16 NOTE — Progress Notes (Signed)
Complaint:  Visit Type: Patient returns to my office for continued preventative foot care services. Complaint: Patient states" my nails have grown long and thick and become painful to walk and wear shoes" Patient has been taking coumadin.. The patient presents for preventative foot care services. No changes to ROS  Podiatric Exam: Vascular: dorsalis pedis and posterior tibial pulses are palpable bilateral. Capillary return is immediate. Temperature gradient is WNL. Skin turgor WNL  Sensorium: Normal Semmes Weinstein monofilament test. Normal tactile sensation bilaterally. Nail Exam: Pt has thick disfigured discolored nails with subungual debris noted bilateral entire nail hallux through fifth toenails Ulcer Exam: There is no evidence of ulcer or pre-ulcerative changes or infection. Orthopedic Exam: Muscle tone and strength are WNL. No limitations in general ROM. No crepitus or effusions noted. Foot type and digits show no abnormalities. HAV  B/L. Skin: No Porokeratosis. No infection or ulcers  Diagnosis:  Onychomycosis, , Pain in right toe, pain in left toes  Treatment & Plan Procedures and Treatment: Consent by patient was obtained for treatment procedures.   Debridement of mycotic and hypertrophic toenails, 1 through 5 bilateral and clearing of subungual debris. No ulceration, no infection noted.  Return Visit-Office Procedure: Patient instructed to return to the office for a follow up visit 3 months   for continued evaluation and treatment.    Gardiner Barefoot DPM

## 2019-09-28 DIAGNOSIS — D649 Anemia, unspecified: Secondary | ICD-10-CM | POA: Diagnosis not present

## 2019-09-28 DIAGNOSIS — D509 Iron deficiency anemia, unspecified: Secondary | ICD-10-CM | POA: Diagnosis not present

## 2019-09-28 DIAGNOSIS — R531 Weakness: Secondary | ICD-10-CM | POA: Diagnosis not present

## 2019-09-30 DIAGNOSIS — M069 Rheumatoid arthritis, unspecified: Secondary | ICD-10-CM | POA: Diagnosis not present

## 2019-09-30 DIAGNOSIS — D509 Iron deficiency anemia, unspecified: Secondary | ICD-10-CM | POA: Diagnosis not present

## 2019-09-30 DIAGNOSIS — I4891 Unspecified atrial fibrillation: Secondary | ICD-10-CM | POA: Diagnosis not present

## 2019-09-30 DIAGNOSIS — M150 Primary generalized (osteo)arthritis: Secondary | ICD-10-CM | POA: Diagnosis not present

## 2019-09-30 DIAGNOSIS — Z96641 Presence of right artificial hip joint: Secondary | ICD-10-CM | POA: Diagnosis not present

## 2019-09-30 DIAGNOSIS — R531 Weakness: Secondary | ICD-10-CM | POA: Diagnosis not present

## 2019-09-30 DIAGNOSIS — E782 Mixed hyperlipidemia: Secondary | ICD-10-CM | POA: Diagnosis not present

## 2019-09-30 DIAGNOSIS — Z87891 Personal history of nicotine dependence: Secondary | ICD-10-CM | POA: Diagnosis not present

## 2019-09-30 DIAGNOSIS — Z9181 History of falling: Secondary | ICD-10-CM | POA: Diagnosis not present

## 2019-09-30 DIAGNOSIS — I10 Essential (primary) hypertension: Secondary | ICD-10-CM | POA: Diagnosis not present

## 2019-10-06 ENCOUNTER — Telehealth: Payer: Self-pay | Admitting: Cardiology

## 2019-10-06 NOTE — Telephone Encounter (Signed)
[facility] HIM Dept received a request for amendment. [name] had an appointment with Dr. Percival Spanish &  his son's information was in his chart. The request was sent to Dr. Percival Spanish,  Who has made the corrections & removed the pacemaker information. (Patient does not have a pacemaker but his son does). Amendment approved by Dr. Percival Spanish.  10/06/19  [facility]

## 2019-10-07 ENCOUNTER — Other Ambulatory Visit: Payer: Self-pay

## 2019-10-16 DIAGNOSIS — R531 Weakness: Secondary | ICD-10-CM | POA: Diagnosis not present

## 2019-10-16 DIAGNOSIS — M069 Rheumatoid arthritis, unspecified: Secondary | ICD-10-CM | POA: Diagnosis not present

## 2019-10-16 DIAGNOSIS — D509 Iron deficiency anemia, unspecified: Secondary | ICD-10-CM | POA: Diagnosis not present

## 2019-10-16 DIAGNOSIS — M150 Primary generalized (osteo)arthritis: Secondary | ICD-10-CM | POA: Diagnosis not present

## 2019-10-19 DIAGNOSIS — M25561 Pain in right knee: Secondary | ICD-10-CM | POA: Insufficient documentation

## 2019-10-30 DIAGNOSIS — R531 Weakness: Secondary | ICD-10-CM | POA: Diagnosis not present

## 2019-10-30 DIAGNOSIS — I10 Essential (primary) hypertension: Secondary | ICD-10-CM | POA: Diagnosis not present

## 2019-10-30 DIAGNOSIS — E782 Mixed hyperlipidemia: Secondary | ICD-10-CM | POA: Diagnosis not present

## 2019-10-30 DIAGNOSIS — I4891 Unspecified atrial fibrillation: Secondary | ICD-10-CM | POA: Diagnosis not present

## 2019-10-30 DIAGNOSIS — M069 Rheumatoid arthritis, unspecified: Secondary | ICD-10-CM | POA: Diagnosis not present

## 2019-10-30 DIAGNOSIS — D509 Iron deficiency anemia, unspecified: Secondary | ICD-10-CM | POA: Diagnosis not present

## 2019-10-30 DIAGNOSIS — M150 Primary generalized (osteo)arthritis: Secondary | ICD-10-CM | POA: Diagnosis not present

## 2019-10-30 DIAGNOSIS — Z96641 Presence of right artificial hip joint: Secondary | ICD-10-CM | POA: Diagnosis not present

## 2019-10-30 DIAGNOSIS — Z9181 History of falling: Secondary | ICD-10-CM | POA: Diagnosis not present

## 2019-10-30 DIAGNOSIS — Z87891 Personal history of nicotine dependence: Secondary | ICD-10-CM | POA: Diagnosis not present

## 2019-11-02 ENCOUNTER — Other Ambulatory Visit: Payer: Self-pay

## 2019-11-02 ENCOUNTER — Encounter (INDEPENDENT_AMBULATORY_CARE_PROVIDER_SITE_OTHER): Payer: Medicare Other | Admitting: Ophthalmology

## 2019-11-02 DIAGNOSIS — H353231 Exudative age-related macular degeneration, bilateral, with active choroidal neovascularization: Secondary | ICD-10-CM | POA: Diagnosis not present

## 2019-11-02 DIAGNOSIS — I10 Essential (primary) hypertension: Secondary | ICD-10-CM

## 2019-11-02 DIAGNOSIS — H43813 Vitreous degeneration, bilateral: Secondary | ICD-10-CM

## 2019-11-02 DIAGNOSIS — H35033 Hypertensive retinopathy, bilateral: Secondary | ICD-10-CM

## 2019-11-03 DIAGNOSIS — M069 Rheumatoid arthritis, unspecified: Secondary | ICD-10-CM | POA: Diagnosis not present

## 2019-11-03 DIAGNOSIS — I4891 Unspecified atrial fibrillation: Secondary | ICD-10-CM | POA: Diagnosis not present

## 2019-11-03 DIAGNOSIS — R531 Weakness: Secondary | ICD-10-CM | POA: Diagnosis not present

## 2019-11-03 DIAGNOSIS — D509 Iron deficiency anemia, unspecified: Secondary | ICD-10-CM | POA: Diagnosis not present

## 2019-11-03 DIAGNOSIS — I10 Essential (primary) hypertension: Secondary | ICD-10-CM | POA: Diagnosis not present

## 2019-11-03 DIAGNOSIS — M150 Primary generalized (osteo)arthritis: Secondary | ICD-10-CM | POA: Diagnosis not present

## 2019-11-09 DIAGNOSIS — M069 Rheumatoid arthritis, unspecified: Secondary | ICD-10-CM | POA: Diagnosis not present

## 2019-11-09 DIAGNOSIS — I4891 Unspecified atrial fibrillation: Secondary | ICD-10-CM | POA: Diagnosis not present

## 2019-11-09 DIAGNOSIS — M150 Primary generalized (osteo)arthritis: Secondary | ICD-10-CM | POA: Diagnosis not present

## 2019-11-09 DIAGNOSIS — I10 Essential (primary) hypertension: Secondary | ICD-10-CM | POA: Diagnosis not present

## 2019-11-09 DIAGNOSIS — D509 Iron deficiency anemia, unspecified: Secondary | ICD-10-CM | POA: Diagnosis not present

## 2019-11-09 DIAGNOSIS — R531 Weakness: Secondary | ICD-10-CM | POA: Diagnosis not present

## 2019-11-11 DIAGNOSIS — I4891 Unspecified atrial fibrillation: Secondary | ICD-10-CM | POA: Diagnosis not present

## 2019-11-11 DIAGNOSIS — M150 Primary generalized (osteo)arthritis: Secondary | ICD-10-CM | POA: Diagnosis not present

## 2019-11-11 DIAGNOSIS — M069 Rheumatoid arthritis, unspecified: Secondary | ICD-10-CM | POA: Diagnosis not present

## 2019-11-11 DIAGNOSIS — R531 Weakness: Secondary | ICD-10-CM | POA: Diagnosis not present

## 2019-11-11 DIAGNOSIS — I10 Essential (primary) hypertension: Secondary | ICD-10-CM | POA: Diagnosis not present

## 2019-11-11 DIAGNOSIS — D509 Iron deficiency anemia, unspecified: Secondary | ICD-10-CM | POA: Diagnosis not present

## 2019-11-12 DIAGNOSIS — D509 Iron deficiency anemia, unspecified: Secondary | ICD-10-CM | POA: Diagnosis not present

## 2019-11-12 DIAGNOSIS — I4891 Unspecified atrial fibrillation: Secondary | ICD-10-CM | POA: Diagnosis not present

## 2019-11-12 DIAGNOSIS — M150 Primary generalized (osteo)arthritis: Secondary | ICD-10-CM | POA: Diagnosis not present

## 2019-11-12 DIAGNOSIS — R531 Weakness: Secondary | ICD-10-CM | POA: Diagnosis not present

## 2019-11-12 DIAGNOSIS — I10 Essential (primary) hypertension: Secondary | ICD-10-CM | POA: Diagnosis not present

## 2019-11-12 DIAGNOSIS — M069 Rheumatoid arthritis, unspecified: Secondary | ICD-10-CM | POA: Diagnosis not present

## 2019-11-16 DIAGNOSIS — Z7901 Long term (current) use of anticoagulants: Secondary | ICD-10-CM | POA: Diagnosis not present

## 2019-11-16 DIAGNOSIS — D649 Anemia, unspecified: Secondary | ICD-10-CM | POA: Diagnosis not present

## 2019-11-16 DIAGNOSIS — R748 Abnormal levels of other serum enzymes: Secondary | ICD-10-CM | POA: Diagnosis not present

## 2019-11-17 DIAGNOSIS — M069 Rheumatoid arthritis, unspecified: Secondary | ICD-10-CM | POA: Diagnosis not present

## 2019-11-17 DIAGNOSIS — D509 Iron deficiency anemia, unspecified: Secondary | ICD-10-CM | POA: Diagnosis not present

## 2019-11-17 DIAGNOSIS — I4891 Unspecified atrial fibrillation: Secondary | ICD-10-CM | POA: Diagnosis not present

## 2019-11-17 DIAGNOSIS — R531 Weakness: Secondary | ICD-10-CM | POA: Diagnosis not present

## 2019-11-17 DIAGNOSIS — M150 Primary generalized (osteo)arthritis: Secondary | ICD-10-CM | POA: Diagnosis not present

## 2019-11-17 DIAGNOSIS — I10 Essential (primary) hypertension: Secondary | ICD-10-CM | POA: Diagnosis not present

## 2019-11-18 DIAGNOSIS — R531 Weakness: Secondary | ICD-10-CM | POA: Diagnosis not present

## 2019-11-18 DIAGNOSIS — I10 Essential (primary) hypertension: Secondary | ICD-10-CM | POA: Diagnosis not present

## 2019-11-18 DIAGNOSIS — D509 Iron deficiency anemia, unspecified: Secondary | ICD-10-CM | POA: Diagnosis not present

## 2019-11-18 DIAGNOSIS — M069 Rheumatoid arthritis, unspecified: Secondary | ICD-10-CM | POA: Diagnosis not present

## 2019-11-18 DIAGNOSIS — M150 Primary generalized (osteo)arthritis: Secondary | ICD-10-CM | POA: Diagnosis not present

## 2019-11-18 DIAGNOSIS — I4891 Unspecified atrial fibrillation: Secondary | ICD-10-CM | POA: Diagnosis not present

## 2019-11-19 DIAGNOSIS — I10 Essential (primary) hypertension: Secondary | ICD-10-CM | POA: Diagnosis not present

## 2019-11-19 DIAGNOSIS — D649 Anemia, unspecified: Secondary | ICD-10-CM | POA: Diagnosis not present

## 2019-11-19 DIAGNOSIS — I4891 Unspecified atrial fibrillation: Secondary | ICD-10-CM | POA: Diagnosis not present

## 2019-11-19 DIAGNOSIS — R531 Weakness: Secondary | ICD-10-CM | POA: Diagnosis not present

## 2019-11-26 DIAGNOSIS — Z8719 Personal history of other diseases of the digestive system: Secondary | ICD-10-CM | POA: Diagnosis not present

## 2019-11-26 DIAGNOSIS — I4891 Unspecified atrial fibrillation: Secondary | ICD-10-CM | POA: Diagnosis not present

## 2019-11-26 DIAGNOSIS — Z7901 Long term (current) use of anticoagulants: Secondary | ICD-10-CM | POA: Diagnosis not present

## 2019-12-04 DIAGNOSIS — I4891 Unspecified atrial fibrillation: Secondary | ICD-10-CM | POA: Diagnosis not present

## 2019-12-04 DIAGNOSIS — Z7901 Long term (current) use of anticoagulants: Secondary | ICD-10-CM | POA: Diagnosis not present

## 2019-12-04 DIAGNOSIS — Z8719 Personal history of other diseases of the digestive system: Secondary | ICD-10-CM | POA: Diagnosis not present

## 2019-12-11 DIAGNOSIS — Z7901 Long term (current) use of anticoagulants: Secondary | ICD-10-CM | POA: Diagnosis not present

## 2019-12-11 DIAGNOSIS — Z8719 Personal history of other diseases of the digestive system: Secondary | ICD-10-CM | POA: Diagnosis not present

## 2019-12-11 DIAGNOSIS — I4891 Unspecified atrial fibrillation: Secondary | ICD-10-CM | POA: Diagnosis not present

## 2019-12-18 ENCOUNTER — Ambulatory Visit (INDEPENDENT_AMBULATORY_CARE_PROVIDER_SITE_OTHER): Payer: Medicare Other | Admitting: Podiatry

## 2019-12-18 ENCOUNTER — Other Ambulatory Visit: Payer: Self-pay

## 2019-12-18 ENCOUNTER — Encounter: Payer: Self-pay | Admitting: Podiatry

## 2019-12-18 DIAGNOSIS — B351 Tinea unguium: Secondary | ICD-10-CM

## 2019-12-18 DIAGNOSIS — D689 Coagulation defect, unspecified: Secondary | ICD-10-CM | POA: Diagnosis not present

## 2019-12-18 NOTE — Progress Notes (Signed)
Complaint:  Visit Type: Patient returns to my office for continued preventative foot care services. Complaint: Patient states" my nails have grown long and thick and become painful to walk and wear shoes" Patient has been taking coumadin.. The patient presents for preventative foot care services. No changes to ROS  Podiatric Exam: Vascular: dorsalis pedis and posterior tibial pulses are palpable bilateral. Capillary return is immediate. Temperature gradient is WNL. Skin turgor WNL  Sensorium: Normal Semmes Weinstein monofilament test. Normal tactile sensation bilaterally. Nail Exam: Pt has thick disfigured discolored nails with subungual debris noted bilateral entire nail hallux through fifth toenails Ulcer Exam: There is no evidence of ulcer or pre-ulcerative changes or infection. Orthopedic Exam: Muscle tone and strength are WNL. No limitations in general ROM. No crepitus or effusions noted. Foot type and digits show no abnormalities. HAV  B/L. Skin: No Porokeratosis. No infection or ulcers  Diagnosis:  Onychomycosis, , Pain in right toe, pain in left toes  Treatment & Plan Procedures and Treatment: Consent by patient was obtained for treatment procedures.   Debridement of mycotic and hypertrophic toenails, 1 through 5 bilateral and clearing of subungual debris. No ulceration, no infection noted.  Return Visit-Office Procedure: Patient instructed to return to the office for a follow up visit 3 months   for continued evaluation and treatment.    Gardiner Barefoot DPM

## 2019-12-21 ENCOUNTER — Encounter (INDEPENDENT_AMBULATORY_CARE_PROVIDER_SITE_OTHER): Payer: Medicare Other | Admitting: Ophthalmology

## 2019-12-21 DIAGNOSIS — H35033 Hypertensive retinopathy, bilateral: Secondary | ICD-10-CM | POA: Diagnosis not present

## 2019-12-21 DIAGNOSIS — H43813 Vitreous degeneration, bilateral: Secondary | ICD-10-CM | POA: Diagnosis not present

## 2019-12-21 DIAGNOSIS — H353231 Exudative age-related macular degeneration, bilateral, with active choroidal neovascularization: Secondary | ICD-10-CM

## 2019-12-21 DIAGNOSIS — I10 Essential (primary) hypertension: Secondary | ICD-10-CM | POA: Diagnosis not present

## 2019-12-24 DIAGNOSIS — I4891 Unspecified atrial fibrillation: Secondary | ICD-10-CM | POA: Diagnosis not present

## 2019-12-24 DIAGNOSIS — Z7901 Long term (current) use of anticoagulants: Secondary | ICD-10-CM | POA: Diagnosis not present

## 2019-12-24 DIAGNOSIS — R001 Bradycardia, unspecified: Secondary | ICD-10-CM | POA: Diagnosis not present

## 2019-12-24 DIAGNOSIS — I959 Hypotension, unspecified: Secondary | ICD-10-CM | POA: Diagnosis not present

## 2019-12-24 DIAGNOSIS — Z8719 Personal history of other diseases of the digestive system: Secondary | ICD-10-CM | POA: Diagnosis not present

## 2020-01-14 DIAGNOSIS — D649 Anemia, unspecified: Secondary | ICD-10-CM | POA: Diagnosis not present

## 2020-01-14 DIAGNOSIS — Z8719 Personal history of other diseases of the digestive system: Secondary | ICD-10-CM | POA: Diagnosis not present

## 2020-01-14 DIAGNOSIS — Z7901 Long term (current) use of anticoagulants: Secondary | ICD-10-CM | POA: Diagnosis not present

## 2020-01-14 DIAGNOSIS — I4891 Unspecified atrial fibrillation: Secondary | ICD-10-CM | POA: Diagnosis not present

## 2020-01-20 DIAGNOSIS — H35033 Hypertensive retinopathy, bilateral: Secondary | ICD-10-CM | POA: Diagnosis not present

## 2020-01-20 DIAGNOSIS — Z961 Presence of intraocular lens: Secondary | ICD-10-CM | POA: Diagnosis not present

## 2020-01-20 DIAGNOSIS — H3530 Unspecified macular degeneration: Secondary | ICD-10-CM | POA: Diagnosis not present

## 2020-01-20 DIAGNOSIS — H401113 Primary open-angle glaucoma, right eye, severe stage: Secondary | ICD-10-CM | POA: Diagnosis not present

## 2020-01-27 DIAGNOSIS — H401113 Primary open-angle glaucoma, right eye, severe stage: Secondary | ICD-10-CM | POA: Diagnosis not present

## 2020-02-01 ENCOUNTER — Other Ambulatory Visit: Payer: Self-pay

## 2020-02-01 ENCOUNTER — Encounter (INDEPENDENT_AMBULATORY_CARE_PROVIDER_SITE_OTHER): Payer: Medicare Other | Admitting: Ophthalmology

## 2020-02-01 DIAGNOSIS — I10 Essential (primary) hypertension: Secondary | ICD-10-CM | POA: Diagnosis not present

## 2020-02-01 DIAGNOSIS — H35033 Hypertensive retinopathy, bilateral: Secondary | ICD-10-CM | POA: Diagnosis not present

## 2020-02-01 DIAGNOSIS — H353231 Exudative age-related macular degeneration, bilateral, with active choroidal neovascularization: Secondary | ICD-10-CM

## 2020-02-01 DIAGNOSIS — H43813 Vitreous degeneration, bilateral: Secondary | ICD-10-CM | POA: Diagnosis not present

## 2020-02-18 DIAGNOSIS — Z7901 Long term (current) use of anticoagulants: Secondary | ICD-10-CM | POA: Diagnosis not present

## 2020-02-18 DIAGNOSIS — I4891 Unspecified atrial fibrillation: Secondary | ICD-10-CM | POA: Diagnosis not present

## 2020-02-18 DIAGNOSIS — Z8719 Personal history of other diseases of the digestive system: Secondary | ICD-10-CM | POA: Diagnosis not present

## 2020-03-16 DIAGNOSIS — H401113 Primary open-angle glaucoma, right eye, severe stage: Secondary | ICD-10-CM | POA: Diagnosis not present

## 2020-03-17 DIAGNOSIS — I4891 Unspecified atrial fibrillation: Secondary | ICD-10-CM | POA: Diagnosis not present

## 2020-03-17 DIAGNOSIS — D649 Anemia, unspecified: Secondary | ICD-10-CM | POA: Diagnosis not present

## 2020-03-17 DIAGNOSIS — Z7901 Long term (current) use of anticoagulants: Secondary | ICD-10-CM | POA: Diagnosis not present

## 2020-03-18 ENCOUNTER — Ambulatory Visit: Payer: Medicare Other | Admitting: Podiatry

## 2020-03-21 ENCOUNTER — Encounter (INDEPENDENT_AMBULATORY_CARE_PROVIDER_SITE_OTHER): Payer: Medicare Other | Admitting: Ophthalmology

## 2020-03-21 ENCOUNTER — Other Ambulatory Visit: Payer: Self-pay

## 2020-03-21 DIAGNOSIS — H35033 Hypertensive retinopathy, bilateral: Secondary | ICD-10-CM

## 2020-03-21 DIAGNOSIS — H43813 Vitreous degeneration, bilateral: Secondary | ICD-10-CM | POA: Diagnosis not present

## 2020-03-21 DIAGNOSIS — I10 Essential (primary) hypertension: Secondary | ICD-10-CM | POA: Diagnosis not present

## 2020-03-21 DIAGNOSIS — H353231 Exudative age-related macular degeneration, bilateral, with active choroidal neovascularization: Secondary | ICD-10-CM

## 2020-04-18 DIAGNOSIS — H401113 Primary open-angle glaucoma, right eye, severe stage: Secondary | ICD-10-CM | POA: Diagnosis not present

## 2020-04-19 DIAGNOSIS — Z7901 Long term (current) use of anticoagulants: Secondary | ICD-10-CM | POA: Diagnosis not present

## 2020-04-19 DIAGNOSIS — I4891 Unspecified atrial fibrillation: Secondary | ICD-10-CM | POA: Diagnosis not present

## 2020-05-09 ENCOUNTER — Other Ambulatory Visit: Payer: Self-pay

## 2020-05-09 ENCOUNTER — Encounter (INDEPENDENT_AMBULATORY_CARE_PROVIDER_SITE_OTHER): Payer: Medicare Other | Admitting: Ophthalmology

## 2020-05-09 DIAGNOSIS — I10 Essential (primary) hypertension: Secondary | ICD-10-CM

## 2020-05-09 DIAGNOSIS — H353231 Exudative age-related macular degeneration, bilateral, with active choroidal neovascularization: Secondary | ICD-10-CM | POA: Diagnosis not present

## 2020-05-09 DIAGNOSIS — H43813 Vitreous degeneration, bilateral: Secondary | ICD-10-CM

## 2020-05-09 DIAGNOSIS — H35033 Hypertensive retinopathy, bilateral: Secondary | ICD-10-CM | POA: Diagnosis not present

## 2020-05-24 DIAGNOSIS — I4891 Unspecified atrial fibrillation: Secondary | ICD-10-CM | POA: Diagnosis not present

## 2020-05-24 DIAGNOSIS — Z7901 Long term (current) use of anticoagulants: Secondary | ICD-10-CM | POA: Diagnosis not present

## 2020-05-25 ENCOUNTER — Other Ambulatory Visit: Payer: Self-pay

## 2020-05-25 ENCOUNTER — Ambulatory Visit (INDEPENDENT_AMBULATORY_CARE_PROVIDER_SITE_OTHER): Payer: Medicare Other | Admitting: Podiatry

## 2020-05-25 ENCOUNTER — Encounter: Payer: Self-pay | Admitting: Podiatry

## 2020-05-25 DIAGNOSIS — D689 Coagulation defect, unspecified: Secondary | ICD-10-CM

## 2020-05-25 DIAGNOSIS — B351 Tinea unguium: Secondary | ICD-10-CM | POA: Diagnosis not present

## 2020-05-25 NOTE — Progress Notes (Signed)
This patient returns to my office for at risk foot care.  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  He presents to the office with his son.  This patient is unable to cut nails himself since the patient cannot reach his nails.These nails are painful walking and wearing shoes.  This patient presents for at risk foot care today.  General Appearance  Alert, conversant and in no acute stress.  Vascular  Dorsalis pedis and posterior tibial  pulses are palpable  bilaterally.  Capillary return is within normal limits  bilaterally. Temperature is within normal limits  bilaterally.  Neurologic  Senn-Weinstein monofilament wire test within normal limits  bilaterally. Muscle power within normal limits bilaterally.  Nails Thick disfigured discolored nails with subungual debris  from hallux to fifth toes bilaterally. No evidence of bacterial infection or drainage bilaterally.  Orthopedic  No limitations of motion  feet .  No crepitus or effusions noted.  No bony pathology or digital deformities noted.  HAV  B/L.  Skin  normotropic skin with no porokeratosis noted bilaterally.  No signs of infections or ulcers noted.     Onychomycosis  Pain in right toes  Pain in left toes  Consent was obtained for treatment procedures.   Mechanical debridement of nails 1-5  bilaterally performed with a nail nipper.  Filed with dremel without incident.    Return office visit  3 months                    Told patient to return for periodic foot care and evaluation due to potential at risk complications.   Minami Arriaga DPM  

## 2020-06-27 ENCOUNTER — Encounter (INDEPENDENT_AMBULATORY_CARE_PROVIDER_SITE_OTHER): Payer: Medicare Other | Admitting: Ophthalmology

## 2020-06-27 ENCOUNTER — Other Ambulatory Visit: Payer: Self-pay

## 2020-06-27 DIAGNOSIS — I10 Essential (primary) hypertension: Secondary | ICD-10-CM | POA: Diagnosis not present

## 2020-06-27 DIAGNOSIS — H43813 Vitreous degeneration, bilateral: Secondary | ICD-10-CM | POA: Diagnosis not present

## 2020-06-27 DIAGNOSIS — H353231 Exudative age-related macular degeneration, bilateral, with active choroidal neovascularization: Secondary | ICD-10-CM | POA: Diagnosis not present

## 2020-06-27 DIAGNOSIS — H35033 Hypertensive retinopathy, bilateral: Secondary | ICD-10-CM

## 2020-06-28 DIAGNOSIS — I4891 Unspecified atrial fibrillation: Secondary | ICD-10-CM | POA: Diagnosis not present

## 2020-06-28 DIAGNOSIS — Z7901 Long term (current) use of anticoagulants: Secondary | ICD-10-CM | POA: Diagnosis not present

## 2020-07-26 DIAGNOSIS — Z7901 Long term (current) use of anticoagulants: Secondary | ICD-10-CM | POA: Diagnosis not present

## 2020-07-26 DIAGNOSIS — I4891 Unspecified atrial fibrillation: Secondary | ICD-10-CM | POA: Diagnosis not present

## 2020-07-27 DIAGNOSIS — H401113 Primary open-angle glaucoma, right eye, severe stage: Secondary | ICD-10-CM | POA: Diagnosis not present

## 2020-07-27 DIAGNOSIS — Z961 Presence of intraocular lens: Secondary | ICD-10-CM | POA: Diagnosis not present

## 2020-07-27 DIAGNOSIS — H3530 Unspecified macular degeneration: Secondary | ICD-10-CM | POA: Diagnosis not present

## 2020-07-27 DIAGNOSIS — H35033 Hypertensive retinopathy, bilateral: Secondary | ICD-10-CM | POA: Diagnosis not present

## 2020-08-09 DIAGNOSIS — I10 Essential (primary) hypertension: Secondary | ICD-10-CM | POA: Diagnosis not present

## 2020-08-09 DIAGNOSIS — E782 Mixed hyperlipidemia: Secondary | ICD-10-CM | POA: Diagnosis not present

## 2020-08-09 DIAGNOSIS — Z23 Encounter for immunization: Secondary | ICD-10-CM | POA: Diagnosis not present

## 2020-08-09 DIAGNOSIS — D649 Anemia, unspecified: Secondary | ICD-10-CM | POA: Diagnosis not present

## 2020-08-09 DIAGNOSIS — M150 Primary generalized (osteo)arthritis: Secondary | ICD-10-CM | POA: Diagnosis not present

## 2020-08-09 DIAGNOSIS — M069 Rheumatoid arthritis, unspecified: Secondary | ICD-10-CM | POA: Diagnosis not present

## 2020-08-09 DIAGNOSIS — I4891 Unspecified atrial fibrillation: Secondary | ICD-10-CM | POA: Diagnosis not present

## 2020-08-15 ENCOUNTER — Other Ambulatory Visit: Payer: Self-pay

## 2020-08-15 ENCOUNTER — Encounter (INDEPENDENT_AMBULATORY_CARE_PROVIDER_SITE_OTHER): Payer: Medicare Other | Admitting: Ophthalmology

## 2020-08-15 DIAGNOSIS — H35033 Hypertensive retinopathy, bilateral: Secondary | ICD-10-CM

## 2020-08-15 DIAGNOSIS — I10 Essential (primary) hypertension: Secondary | ICD-10-CM | POA: Diagnosis not present

## 2020-08-15 DIAGNOSIS — H353231 Exudative age-related macular degeneration, bilateral, with active choroidal neovascularization: Secondary | ICD-10-CM

## 2020-08-15 DIAGNOSIS — H43813 Vitreous degeneration, bilateral: Secondary | ICD-10-CM

## 2020-08-16 DIAGNOSIS — I4891 Unspecified atrial fibrillation: Secondary | ICD-10-CM | POA: Diagnosis not present

## 2020-08-16 DIAGNOSIS — Z8719 Personal history of other diseases of the digestive system: Secondary | ICD-10-CM | POA: Diagnosis not present

## 2020-08-16 DIAGNOSIS — I10 Essential (primary) hypertension: Secondary | ICD-10-CM | POA: Diagnosis not present

## 2020-08-16 DIAGNOSIS — M150 Primary generalized (osteo)arthritis: Secondary | ICD-10-CM | POA: Diagnosis not present

## 2020-08-16 DIAGNOSIS — E782 Mixed hyperlipidemia: Secondary | ICD-10-CM | POA: Diagnosis not present

## 2020-08-16 DIAGNOSIS — Z0000 Encounter for general adult medical examination without abnormal findings: Secondary | ICD-10-CM | POA: Diagnosis not present

## 2020-08-16 DIAGNOSIS — M069 Rheumatoid arthritis, unspecified: Secondary | ICD-10-CM | POA: Diagnosis not present

## 2020-08-17 ENCOUNTER — Encounter: Payer: Self-pay | Admitting: Podiatry

## 2020-08-17 ENCOUNTER — Other Ambulatory Visit: Payer: Self-pay

## 2020-08-17 ENCOUNTER — Ambulatory Visit (INDEPENDENT_AMBULATORY_CARE_PROVIDER_SITE_OTHER): Payer: Medicare Other | Admitting: Podiatry

## 2020-08-17 DIAGNOSIS — D689 Coagulation defect, unspecified: Secondary | ICD-10-CM | POA: Diagnosis not present

## 2020-08-17 DIAGNOSIS — B351 Tinea unguium: Secondary | ICD-10-CM

## 2020-08-17 NOTE — Progress Notes (Signed)
This patient returns to my office for at risk foot care.  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  He presents to the office with his son.  This patient is unable to cut nails himself since the patient cannot reach his nails.These nails are painful walking and wearing shoes.  This patient presents for at risk foot care today.  General Appearance  Alert, conversant and in no acute stress.  Vascular  Dorsalis pedis and posterior tibial  pulses are palpable  bilaterally.  Capillary return is within normal limits  bilaterally. Temperature is within normal limits  bilaterally.  Neurologic  Senn-Weinstein monofilament wire test within normal limits  bilaterally. Muscle power within normal limits bilaterally.  Nails Thick disfigured discolored nails with subungual debris  from hallux to fifth toes bilaterally. No evidence of bacterial infection or drainage bilaterally.  Orthopedic  No limitations of motion  feet .  No crepitus or effusions noted.  No bony pathology or digital deformities noted.  HAV  B/L.  Skin  normotropic skin with no porokeratosis noted bilaterally.  No signs of infections or ulcers noted.     Onychomycosis  Pain in right toes  Pain in left toes  Consent was obtained for treatment procedures.   Mechanical debridement of nails 1-5  bilaterally performed with a nail nipper.  Filed with dremel without incident.    Return office visit  3 months                    Told patient to return for periodic foot care and evaluation due to potential at risk complications.   Gardiner Barefoot DPM

## 2020-08-22 DIAGNOSIS — H35033 Hypertensive retinopathy, bilateral: Secondary | ICD-10-CM | POA: Diagnosis not present

## 2020-08-22 DIAGNOSIS — H3530 Unspecified macular degeneration: Secondary | ICD-10-CM | POA: Diagnosis not present

## 2020-08-22 DIAGNOSIS — Z961 Presence of intraocular lens: Secondary | ICD-10-CM | POA: Diagnosis not present

## 2020-08-22 DIAGNOSIS — H401133 Primary open-angle glaucoma, bilateral, severe stage: Secondary | ICD-10-CM | POA: Diagnosis not present

## 2020-08-23 DIAGNOSIS — Z7901 Long term (current) use of anticoagulants: Secondary | ICD-10-CM | POA: Diagnosis not present

## 2020-08-23 DIAGNOSIS — Z8719 Personal history of other diseases of the digestive system: Secondary | ICD-10-CM | POA: Diagnosis not present

## 2020-08-23 DIAGNOSIS — I4891 Unspecified atrial fibrillation: Secondary | ICD-10-CM | POA: Diagnosis not present

## 2020-08-30 ENCOUNTER — Ambulatory Visit: Payer: Medicare Other | Admitting: Cardiology

## 2020-08-30 DIAGNOSIS — M0589 Other rheumatoid arthritis with rheumatoid factor of multiple sites: Secondary | ICD-10-CM | POA: Diagnosis not present

## 2020-08-30 DIAGNOSIS — Z8781 Personal history of (healed) traumatic fracture: Secondary | ICD-10-CM | POA: Diagnosis not present

## 2020-08-30 DIAGNOSIS — Z6822 Body mass index (BMI) 22.0-22.9, adult: Secondary | ICD-10-CM | POA: Diagnosis not present

## 2020-08-30 NOTE — Progress Notes (Signed)
Cardiology Office Note   Date:  09/01/2020   ID:  [name], DOB [birth year], MRN [id number]  PCP:  Merrilee Seashore, MD  Cardiologist:   Minus Breeding, MD   Chief Complaint  Patient presents with  . Atrial Fibrillation      History of Present Illness: [name] is a 86 y.o. male who presents for had been seen by Dr. Wynonia Lawman.   He had atrial fib in 2006. He had cardioversion.  Since I last saw him he has done very well. He was anemic but this slowly improved over the last year. His most recent hemoglobin was 12 and only mildly reduced. He had gotten down to 8.3 without a clear bleeding source. He doesn't notice his palpitations. He doesn't have any chest pressure, neck or arm discomfort. He doesn't have any shortness of breath, PND or orthopnea. He gets around with his rolling walker and does quite well. He lives with his 67 year old wife.   Past Medical History:  Diagnosis Date  . A-fib ([place])   . Dysrhythmia    Atrial Tachycardia with block  . Erosive esophagitis   . GERD (gastroesophageal reflux disease)   . H/O fracture of rib    Left  5th, 6th, 7th had a fall in APRIL 2016  . Hypertension   . RA (rheumatoid arthritis) ([place])     Past Surgical History:  Procedure Laterality Date  . BALLOON DILATION N/A 05/04/2015   Procedure: BALLOON DILATION;  Surgeon: Wilford Corner, MD;  Location: [facility] ENDOSCOPY;  Service: Endoscopy;  Laterality: N/A;  janie/melissa  . CATARACT EXTRACTION    . ESOPHAGOGASTRODUODENOSCOPY N/A 03/12/2015   Procedure: ESOPHAGOGASTRODUODENOSCOPY (EGD);  Surgeon: Wilford Corner, MD;  Location: [facility] ENDOSCOPY;  Service: Endoscopy;  Laterality: N/A;  . ESOPHAGOGASTRODUODENOSCOPY N/A 05/04/2015   Procedure: ESOPHAGOGASTRODUODENOSCOPY (EGD);  Surgeon: Wilford Corner, MD;  Location: [facility] ENDOSCOPY;  Service: Endoscopy;  Laterality: N/A;  carm  . EYE SURGERY Bilateral 10/2015  . HIP ARTHROPLASTY Left 09/29/2017   Procedure: ARTHROPLASTY  HIP  HEMIARTHROPLASTY left;  Surgeon: Paralee Cancel, MD;  Location: [facility] ORS;  Service: Orthopedics;  Laterality: Left;  . SAVORY DILATION N/A 05/04/2015   Procedure: SAVORY DILATION;  Surgeon: Wilford Corner, MD;  Location: [facility] ENDOSCOPY;  Service: Endoscopy;  Laterality: N/A;     Current Outpatient Medications  Medication Sig Dispense Refill  . Besifloxacin HCl (BESIVANCE) 0.6 % SUSP Besivance 0.6 % eye drops,suspension    . COMBIGAN 0.2-0.5 % ophthalmic solution Place 1 drop into the right eye 2 (two) times daily.    Marland Kitchen diltiazem (TIAZAC) 360 MG 24 hr capsule Take 360 mg by mouth daily.  0  . docusate sodium (COLACE) 100 MG capsule Take 1 capsule (100 mg total) 2 (two) times daily by mouth. 10 capsule 0  . ferrous sulfate 325 (65 FE) MG tablet Take 1 tablet (325 mg total) 3 (three) times daily after meals by mouth.  3  . metoprolol succinate (TOPROL-XL) 50 MG 24 hr tablet Take 50 mg by mouth daily.   1  . Multiple Vitamins-Minerals (PRESERVISION AREDS 2) CAPS Take 1 capsule by mouth 2 (two) times daily.    . Omega-3 Fatty Acids (FISH OIL) 1000 MG CAPS Take 1 capsule (1,000 mg total) by mouth 2 (two) times daily.  0  . pantoprazole (PROTONIX) 40 MG tablet Take 1 tablet (40 mg total) by mouth 2 (two) times daily.    Marland Kitchen ROCKLATAN 0.02-0.005 % SOLN SMARTSIG:1 Drop(s) Right Eye Every Evening    .  warfarin (COUMADIN) 2 MG tablet TAKE 1 TABLET BY MOUTH EVERY DAY EXCEPT ON WEDNESDAY TAKE 2 TABS  3   No current facility-administered medications for this visit.    Allergies:   Shellfish allergy, Sulfa antibiotics, and Fish allergy    ROS:  Please see the history of present illness.   Otherwise, review of systems are positive for none.   All other systems are reviewed and negative.    PHYSICAL EXAM: VS:  BP 121/70   Pulse 60   Temp (!) 96.6 F (35.9 C)   Ht 5\' 10"  (1.778 m)   Wt 154 lb (69.9 kg)   SpO2 98%   BMI 22.10 kg/m  , BMI Body mass index is 22.1 kg/m. GENERAL:  Well appearing NECK:   No jugular venous distention, waveform within normal limits, carotid upstroke brisk and symmetric, no bruits, no thyromegaly LUNGS:  Clear to auscultation bilaterally CHEST:  Unremarkable HEART:  PMI not displaced or sustained,S1 and S2 within normal limits, no S3,  no clicks, no rubs, no murmurs, irregular  ABD:  Flat, positive bowel sounds normal in frequency in pitch, no bruits, no rebound, no guarding, no midline pulsatile mass, no hepatomegaly, no splenomegaly EXT:  2 plus pulses throughout, no edema, no cyanosis no clubbing   EKG:  EKG is  ordered today. Atrial fibrillation, rate 60, axis within normal limits, intervals within normal limits, poor anterior R wave progression.   Recent Labs: No results found for requested labs within last 8760 hours.    Lipid Panel No results found for: CHOL, TRIG, HDL, CHOLHDL, VLDL, LDLCALC, LDLDIRECT    Wt Readings from Last 3 Encounters:  09/01/20 154 lb (69.9 kg)  08/25/19 161 lb (73 kg)  03/03/19 175 lb (79.4 kg)      Other studies Reviewed: Additional studies/ records that were reviewed today include:  Labs Review of the above records demonstrates:  See elsewhere   ASSESSMENT AND PLAN:  ATRIAL FIB:   Mr. [name] has a CHA2DS2 - VASc score of  3.     He has done very well with this rhythm. He tolerates anticoagulation. No change in therapy. He was anemic but review of the most recent labs demonstrates that this is improved.  AI:  This was mild in 2018. I will follow this clinically. No change in therapy.   Current medicines are reviewed at length with the patient today.  The patient does not have concerns regarding medicines.  The following changes have been made:  None  Labs/ tests ordered today include:    Orders Placed This Encounter  Procedures  . EKG 12-Lead     Disposition:   FU with me in one year.      Signed, Minus Breeding, MD  09/01/2020 12:39 PM    [facility]

## 2020-09-01 ENCOUNTER — Other Ambulatory Visit: Payer: Self-pay

## 2020-09-01 ENCOUNTER — Ambulatory Visit (INDEPENDENT_AMBULATORY_CARE_PROVIDER_SITE_OTHER): Payer: Medicare Other | Admitting: Cardiology

## 2020-09-01 ENCOUNTER — Encounter: Payer: Self-pay | Admitting: Cardiology

## 2020-09-01 VITALS — BP 121/70 | HR 60 | Temp 96.6°F | Ht 70.0 in | Wt 154.0 lb

## 2020-09-01 DIAGNOSIS — I351 Nonrheumatic aortic (valve) insufficiency: Secondary | ICD-10-CM

## 2020-09-01 DIAGNOSIS — I4819 Other persistent atrial fibrillation: Secondary | ICD-10-CM | POA: Diagnosis not present

## 2020-09-01 NOTE — Patient Instructions (Signed)
Medication Instructions:  No changes *If you need a refill on your cardiac medications before your next appointment, please call your pharmacy*   Lab Work: None ordered If you have labs (blood work) drawn today and your tests are completely normal, you will receive your results only by: . MyChart Message (if you have MyChart) OR . A paper copy in the mail If you have any lab test that is abnormal or we need to change your treatment, we will call you to review the results.   Testing/Procedures: None ordered   Follow-Up: At [facility], you and your health needs are our priority.  As part of our continuing mission to provide you with exceptional heart care, we have created designated Provider Care Teams.  These Care Teams include your primary Cardiologist (physician) and Advanced Practice Providers (APPs -  Physician Assistants and Nurse Practitioners) who all work together to provide you with the care you need, when you need it.  We recommend signing up for the patient portal called "MyChart".  Sign up information is provided on this After Visit Summary.  MyChart is used to connect with patients for Virtual Visits (Telemedicine).  Patients are able to view lab/test results, encounter notes, upcoming appointments, etc.  Non-urgent messages can be sent to your provider as well.   To learn more about what you can do with MyChart, go to [email or web address].    Your next appointment:   12 month(s)  The format for your next appointment:   In Person  Provider:   James Hochrein, MD   Other Instructions None   

## 2020-09-28 DIAGNOSIS — Z7901 Long term (current) use of anticoagulants: Secondary | ICD-10-CM | POA: Diagnosis not present

## 2020-09-28 DIAGNOSIS — Z8719 Personal history of other diseases of the digestive system: Secondary | ICD-10-CM | POA: Diagnosis not present

## 2020-09-28 DIAGNOSIS — I4891 Unspecified atrial fibrillation: Secondary | ICD-10-CM | POA: Diagnosis not present

## 2020-10-03 ENCOUNTER — Other Ambulatory Visit: Payer: Self-pay

## 2020-10-03 ENCOUNTER — Encounter (INDEPENDENT_AMBULATORY_CARE_PROVIDER_SITE_OTHER): Payer: Medicare Other | Admitting: Ophthalmology

## 2020-10-03 DIAGNOSIS — H35033 Hypertensive retinopathy, bilateral: Secondary | ICD-10-CM | POA: Diagnosis not present

## 2020-10-03 DIAGNOSIS — H353231 Exudative age-related macular degeneration, bilateral, with active choroidal neovascularization: Secondary | ICD-10-CM

## 2020-10-03 DIAGNOSIS — I10 Essential (primary) hypertension: Secondary | ICD-10-CM

## 2020-10-03 DIAGNOSIS — H43813 Vitreous degeneration, bilateral: Secondary | ICD-10-CM

## 2020-10-27 DIAGNOSIS — I4891 Unspecified atrial fibrillation: Secondary | ICD-10-CM | POA: Diagnosis not present

## 2020-10-27 DIAGNOSIS — Z7901 Long term (current) use of anticoagulants: Secondary | ICD-10-CM | POA: Diagnosis not present

## 2020-10-27 DIAGNOSIS — Z8719 Personal history of other diseases of the digestive system: Secondary | ICD-10-CM | POA: Diagnosis not present

## 2020-11-23 ENCOUNTER — Encounter: Payer: Self-pay | Admitting: Podiatry

## 2020-11-23 ENCOUNTER — Other Ambulatory Visit: Payer: Self-pay

## 2020-11-23 ENCOUNTER — Ambulatory Visit (INDEPENDENT_AMBULATORY_CARE_PROVIDER_SITE_OTHER): Payer: Medicare Other | Admitting: Podiatry

## 2020-11-23 DIAGNOSIS — D689 Coagulation defect, unspecified: Secondary | ICD-10-CM

## 2020-11-23 DIAGNOSIS — B351 Tinea unguium: Secondary | ICD-10-CM

## 2020-11-23 NOTE — Progress Notes (Signed)
This patient returns to my office for at risk foot care.  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  He presents to the office with his son.  This patient is unable to cut nails himself since the patient cannot reach his nails.These nails are painful walking and wearing shoes.  This patient presents for at risk foot care today.  General Appearance  Alert, conversant and in no acute stress.  Vascular  Dorsalis pedis and posterior tibial  pulses are palpable  bilaterally.  Capillary return is within normal limits  bilaterally. Temperature is within normal limits  bilaterally.  Neurologic  Senn-Weinstein monofilament wire test within normal limits  bilaterally. Muscle power within normal limits bilaterally.  Nails Thick disfigured discolored nails with subungual debris  from hallux to fifth toes bilaterally. No evidence of bacterial infection or drainage bilaterally.  Orthopedic  No limitations of motion  feet .  No crepitus or effusions noted.  No bony pathology or digital deformities noted.  HAV  B/L.  Skin  normotropic skin with no porokeratosis noted bilaterally.  No signs of infections or ulcers noted.     Onychomycosis  Pain in right toes  Pain in left toes  Consent was obtained for treatment procedures.   Mechanical debridement of nails 1-5  bilaterally performed with a nail nipper.  Filed with dremel without incident.    Return office visit  3 months                    Told patient to return for periodic foot care and evaluation due to potential at risk complications.   Aaleah Hirsch DPM  

## 2020-11-24 DIAGNOSIS — Z7901 Long term (current) use of anticoagulants: Secondary | ICD-10-CM | POA: Diagnosis not present

## 2020-11-24 DIAGNOSIS — I4891 Unspecified atrial fibrillation: Secondary | ICD-10-CM | POA: Diagnosis not present

## 2020-11-24 DIAGNOSIS — Z8719 Personal history of other diseases of the digestive system: Secondary | ICD-10-CM | POA: Diagnosis not present

## 2020-11-24 DIAGNOSIS — I10 Essential (primary) hypertension: Secondary | ICD-10-CM | POA: Diagnosis not present

## 2020-11-28 ENCOUNTER — Encounter (INDEPENDENT_AMBULATORY_CARE_PROVIDER_SITE_OTHER): Payer: Medicare Other | Admitting: Ophthalmology

## 2020-11-29 ENCOUNTER — Encounter (INDEPENDENT_AMBULATORY_CARE_PROVIDER_SITE_OTHER): Payer: Medicare Other | Admitting: Ophthalmology

## 2020-12-05 DIAGNOSIS — H35033 Hypertensive retinopathy, bilateral: Secondary | ICD-10-CM | POA: Diagnosis not present

## 2020-12-05 DIAGNOSIS — H3530 Unspecified macular degeneration: Secondary | ICD-10-CM | POA: Diagnosis not present

## 2020-12-05 DIAGNOSIS — H401113 Primary open-angle glaucoma, right eye, severe stage: Secondary | ICD-10-CM | POA: Diagnosis not present

## 2020-12-05 DIAGNOSIS — Z961 Presence of intraocular lens: Secondary | ICD-10-CM | POA: Diagnosis not present

## 2020-12-06 ENCOUNTER — Other Ambulatory Visit: Payer: Self-pay

## 2020-12-06 ENCOUNTER — Encounter (INDEPENDENT_AMBULATORY_CARE_PROVIDER_SITE_OTHER): Payer: Medicare Other | Admitting: Ophthalmology

## 2020-12-06 DIAGNOSIS — H43813 Vitreous degeneration, bilateral: Secondary | ICD-10-CM | POA: Diagnosis not present

## 2020-12-06 DIAGNOSIS — I10 Essential (primary) hypertension: Secondary | ICD-10-CM

## 2020-12-06 DIAGNOSIS — H353231 Exudative age-related macular degeneration, bilateral, with active choroidal neovascularization: Secondary | ICD-10-CM

## 2020-12-06 DIAGNOSIS — H35033 Hypertensive retinopathy, bilateral: Secondary | ICD-10-CM

## 2020-12-13 DIAGNOSIS — H401113 Primary open-angle glaucoma, right eye, severe stage: Secondary | ICD-10-CM | POA: Diagnosis not present

## 2020-12-13 DIAGNOSIS — H35033 Hypertensive retinopathy, bilateral: Secondary | ICD-10-CM | POA: Diagnosis not present

## 2020-12-13 DIAGNOSIS — Z961 Presence of intraocular lens: Secondary | ICD-10-CM | POA: Diagnosis not present

## 2020-12-27 DIAGNOSIS — I4891 Unspecified atrial fibrillation: Secondary | ICD-10-CM | POA: Diagnosis not present

## 2020-12-27 DIAGNOSIS — I10 Essential (primary) hypertension: Secondary | ICD-10-CM | POA: Diagnosis not present

## 2020-12-27 DIAGNOSIS — Z7901 Long term (current) use of anticoagulants: Secondary | ICD-10-CM | POA: Diagnosis not present

## 2020-12-28 DIAGNOSIS — H401113 Primary open-angle glaucoma, right eye, severe stage: Secondary | ICD-10-CM | POA: Diagnosis not present

## 2020-12-28 DIAGNOSIS — Z961 Presence of intraocular lens: Secondary | ICD-10-CM | POA: Diagnosis not present

## 2020-12-28 DIAGNOSIS — H3530 Unspecified macular degeneration: Secondary | ICD-10-CM | POA: Diagnosis not present

## 2020-12-28 DIAGNOSIS — H35033 Hypertensive retinopathy, bilateral: Secondary | ICD-10-CM | POA: Diagnosis not present

## 2021-01-10 DIAGNOSIS — H401113 Primary open-angle glaucoma, right eye, severe stage: Secondary | ICD-10-CM | POA: Diagnosis not present

## 2021-01-10 DIAGNOSIS — Z961 Presence of intraocular lens: Secondary | ICD-10-CM | POA: Diagnosis not present

## 2021-01-10 DIAGNOSIS — H35033 Hypertensive retinopathy, bilateral: Secondary | ICD-10-CM | POA: Diagnosis not present

## 2021-01-24 DIAGNOSIS — I10 Essential (primary) hypertension: Secondary | ICD-10-CM | POA: Diagnosis not present

## 2021-01-24 DIAGNOSIS — I4891 Unspecified atrial fibrillation: Secondary | ICD-10-CM | POA: Diagnosis not present

## 2021-01-24 DIAGNOSIS — Z7901 Long term (current) use of anticoagulants: Secondary | ICD-10-CM | POA: Diagnosis not present

## 2021-01-30 ENCOUNTER — Other Ambulatory Visit: Payer: Self-pay

## 2021-01-30 ENCOUNTER — Encounter (INDEPENDENT_AMBULATORY_CARE_PROVIDER_SITE_OTHER): Payer: Medicare Other | Admitting: Ophthalmology

## 2021-01-30 DIAGNOSIS — I10 Essential (primary) hypertension: Secondary | ICD-10-CM | POA: Diagnosis not present

## 2021-01-30 DIAGNOSIS — H43813 Vitreous degeneration, bilateral: Secondary | ICD-10-CM | POA: Diagnosis not present

## 2021-01-30 DIAGNOSIS — H353231 Exudative age-related macular degeneration, bilateral, with active choroidal neovascularization: Secondary | ICD-10-CM

## 2021-01-30 DIAGNOSIS — H35033 Hypertensive retinopathy, bilateral: Secondary | ICD-10-CM | POA: Diagnosis not present

## 2021-02-01 ENCOUNTER — Encounter (INDEPENDENT_AMBULATORY_CARE_PROVIDER_SITE_OTHER): Payer: Medicare Other | Admitting: Ophthalmology

## 2021-02-21 DIAGNOSIS — I10 Essential (primary) hypertension: Secondary | ICD-10-CM | POA: Diagnosis not present

## 2021-02-21 DIAGNOSIS — Z7901 Long term (current) use of anticoagulants: Secondary | ICD-10-CM | POA: Diagnosis not present

## 2021-02-21 DIAGNOSIS — I4891 Unspecified atrial fibrillation: Secondary | ICD-10-CM | POA: Diagnosis not present

## 2021-02-22 ENCOUNTER — Encounter: Payer: Self-pay | Admitting: Podiatry

## 2021-02-22 ENCOUNTER — Other Ambulatory Visit: Payer: Self-pay

## 2021-02-22 ENCOUNTER — Ambulatory Visit (INDEPENDENT_AMBULATORY_CARE_PROVIDER_SITE_OTHER): Payer: Medicare Other | Admitting: Podiatry

## 2021-02-22 DIAGNOSIS — D689 Coagulation defect, unspecified: Secondary | ICD-10-CM

## 2021-02-22 DIAGNOSIS — E782 Mixed hyperlipidemia: Secondary | ICD-10-CM | POA: Insufficient documentation

## 2021-02-22 DIAGNOSIS — D509 Iron deficiency anemia, unspecified: Secondary | ICD-10-CM | POA: Insufficient documentation

## 2021-02-22 DIAGNOSIS — B351 Tinea unguium: Secondary | ICD-10-CM

## 2021-02-22 DIAGNOSIS — M069 Rheumatoid arthritis, unspecified: Secondary | ICD-10-CM | POA: Insufficient documentation

## 2021-02-22 DIAGNOSIS — Z8719 Personal history of other diseases of the digestive system: Secondary | ICD-10-CM | POA: Insufficient documentation

## 2021-02-22 DIAGNOSIS — Z0000 Encounter for general adult medical examination without abnormal findings: Secondary | ICD-10-CM | POA: Insufficient documentation

## 2021-02-22 DIAGNOSIS — Z7901 Long term (current) use of anticoagulants: Secondary | ICD-10-CM | POA: Insufficient documentation

## 2021-02-22 DIAGNOSIS — M1991 Primary osteoarthritis, unspecified site: Secondary | ICD-10-CM | POA: Insufficient documentation

## 2021-02-22 DIAGNOSIS — S62607A Fracture of unspecified phalanx of left little finger, initial encounter for closed fracture: Secondary | ICD-10-CM | POA: Insufficient documentation

## 2021-02-22 NOTE — Progress Notes (Signed)
This patient returns to my office for at risk foot care.  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  He presents to the office with his son.  This patient is unable to cut nails himself since the patient cannot reach his nails.These nails are painful walking and wearing shoes.  This patient presents for at risk foot care today.  General Appearance  Alert, conversant and in no acute stress.  Vascular  Dorsalis pedis and posterior tibial  pulses are weakly  palpable  bilaterally.  Capillary return is within normal limits  bilaterally. Cold feet.  bilaterally.  Neurologic  Senn-Weinstein monofilament wire test within normal limits  bilaterally. Muscle power within normal limits bilaterally.  Nails Thick disfigured discolored nails with subungual debris  from hallux to fifth toes bilaterally. No evidence of bacterial infection or drainage bilaterally.  Orthopedic  No limitations of motion  feet .  No crepitus or effusions noted.  No bony pathology or digital deformities noted.  HAV  B/L.  Skin  normotropic skin with no porokeratosis noted bilaterally.  No signs of infections or ulcers noted.     Onychomycosis  Pain in right toes  Pain in left toes  Consent was obtained for treatment procedures.   Mechanical debridement of nails 1-5  bilaterally performed with a nail nipper.  Filed with dremel without incident.    Return office visit  3 months                    Told patient to return for periodic foot care and evaluation due to potential at risk complications.   Gardiner Barefoot DPM

## 2021-03-14 DIAGNOSIS — H35033 Hypertensive retinopathy, bilateral: Secondary | ICD-10-CM | POA: Diagnosis not present

## 2021-03-14 DIAGNOSIS — H401113 Primary open-angle glaucoma, right eye, severe stage: Secondary | ICD-10-CM | POA: Diagnosis not present

## 2021-03-14 DIAGNOSIS — H3530 Unspecified macular degeneration: Secondary | ICD-10-CM | POA: Diagnosis not present

## 2021-03-14 DIAGNOSIS — Z961 Presence of intraocular lens: Secondary | ICD-10-CM | POA: Diagnosis not present

## 2021-03-21 DIAGNOSIS — I4891 Unspecified atrial fibrillation: Secondary | ICD-10-CM | POA: Diagnosis not present

## 2021-03-21 DIAGNOSIS — I10 Essential (primary) hypertension: Secondary | ICD-10-CM | POA: Diagnosis not present

## 2021-03-21 DIAGNOSIS — Z7901 Long term (current) use of anticoagulants: Secondary | ICD-10-CM | POA: Diagnosis not present

## 2021-03-27 ENCOUNTER — Other Ambulatory Visit: Payer: Self-pay

## 2021-03-27 ENCOUNTER — Encounter (INDEPENDENT_AMBULATORY_CARE_PROVIDER_SITE_OTHER): Payer: Medicare Other | Admitting: Ophthalmology

## 2021-03-27 DIAGNOSIS — H353231 Exudative age-related macular degeneration, bilateral, with active choroidal neovascularization: Secondary | ICD-10-CM

## 2021-03-27 DIAGNOSIS — H43813 Vitreous degeneration, bilateral: Secondary | ICD-10-CM

## 2021-03-27 DIAGNOSIS — H35033 Hypertensive retinopathy, bilateral: Secondary | ICD-10-CM

## 2021-03-27 DIAGNOSIS — I10 Essential (primary) hypertension: Secondary | ICD-10-CM | POA: Diagnosis not present

## 2021-04-27 DIAGNOSIS — I10 Essential (primary) hypertension: Secondary | ICD-10-CM | POA: Diagnosis not present

## 2021-04-27 DIAGNOSIS — I4891 Unspecified atrial fibrillation: Secondary | ICD-10-CM | POA: Diagnosis not present

## 2021-04-27 DIAGNOSIS — Z7901 Long term (current) use of anticoagulants: Secondary | ICD-10-CM | POA: Diagnosis not present

## 2021-05-03 ENCOUNTER — Encounter: Payer: Self-pay | Admitting: Podiatry

## 2021-05-03 ENCOUNTER — Other Ambulatory Visit: Payer: Self-pay

## 2021-05-03 ENCOUNTER — Ambulatory Visit (INDEPENDENT_AMBULATORY_CARE_PROVIDER_SITE_OTHER): Payer: Medicare Other | Admitting: Podiatry

## 2021-05-03 DIAGNOSIS — B351 Tinea unguium: Secondary | ICD-10-CM

## 2021-05-03 DIAGNOSIS — D689 Coagulation defect, unspecified: Secondary | ICD-10-CM | POA: Diagnosis not present

## 2021-05-03 DIAGNOSIS — M79675 Pain in left toe(s): Secondary | ICD-10-CM | POA: Diagnosis not present

## 2021-05-03 DIAGNOSIS — M79674 Pain in right toe(s): Secondary | ICD-10-CM | POA: Diagnosis not present

## 2021-05-03 NOTE — Progress Notes (Signed)
This patient returns to my office for at risk foot care.  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  He presents to the office with his son.  This patient is unable to cut nails himself since the patient cannot reach his nails.These nails are painful walking and wearing shoes.  This patient presents for at risk foot care today.  General Appearance  Alert, conversant and in no acute stress.  Vascular  Dorsalis pedis and posterior tibial  pulses are weakly  palpable  bilaterally.  Capillary return is within normal limits  bilaterally. Cold feet.  bilaterally.  Neurologic  Senn-Weinstein monofilament wire test within normal limits  bilaterally. Muscle power within normal limits bilaterally.  Nails Thick disfigured discolored nails with subungual debris  from hallux to fifth toes bilaterally. No evidence of bacterial infection or drainage bilaterally.  Orthopedic  No limitations of motion  feet .  No crepitus or effusions noted.  No bony pathology or digital deformities noted.  HAV  B/L.  Hammer toes 2-5  B/L.  Skin  normotropic skin with no porokeratosis noted bilaterally.  No signs of infections or ulcers noted.     Onychomycosis  Pain in right toes  Pain in left toes  Consent was obtained for treatment procedures.   Mechanical debridement of nails 1-5  bilaterally performed with a nail nipper.  Filed with dremel without incident. Padding dispensed second toe right foot.   Return office visit  3 months                    Told patient to return for periodic foot care and evaluation due to potential at risk complications.   Gardiner Barefoot DPM

## 2021-05-22 ENCOUNTER — Encounter (INDEPENDENT_AMBULATORY_CARE_PROVIDER_SITE_OTHER): Payer: Medicare Other | Admitting: Ophthalmology

## 2021-05-22 ENCOUNTER — Other Ambulatory Visit: Payer: Self-pay

## 2021-05-22 DIAGNOSIS — H43813 Vitreous degeneration, bilateral: Secondary | ICD-10-CM | POA: Diagnosis not present

## 2021-05-22 DIAGNOSIS — I10 Essential (primary) hypertension: Secondary | ICD-10-CM

## 2021-05-22 DIAGNOSIS — H35033 Hypertensive retinopathy, bilateral: Secondary | ICD-10-CM

## 2021-05-22 DIAGNOSIS — H353231 Exudative age-related macular degeneration, bilateral, with active choroidal neovascularization: Secondary | ICD-10-CM

## 2021-05-30 DIAGNOSIS — I4891 Unspecified atrial fibrillation: Secondary | ICD-10-CM | POA: Diagnosis not present

## 2021-05-30 DIAGNOSIS — Z7901 Long term (current) use of anticoagulants: Secondary | ICD-10-CM | POA: Diagnosis not present

## 2021-06-27 DIAGNOSIS — I4891 Unspecified atrial fibrillation: Secondary | ICD-10-CM | POA: Diagnosis not present

## 2021-06-27 DIAGNOSIS — Z7901 Long term (current) use of anticoagulants: Secondary | ICD-10-CM | POA: Diagnosis not present

## 2021-07-19 DIAGNOSIS — H35033 Hypertensive retinopathy, bilateral: Secondary | ICD-10-CM | POA: Diagnosis not present

## 2021-07-19 DIAGNOSIS — H3530 Unspecified macular degeneration: Secondary | ICD-10-CM | POA: Diagnosis not present

## 2021-07-19 DIAGNOSIS — Z961 Presence of intraocular lens: Secondary | ICD-10-CM | POA: Diagnosis not present

## 2021-07-19 DIAGNOSIS — H401113 Primary open-angle glaucoma, right eye, severe stage: Secondary | ICD-10-CM | POA: Diagnosis not present

## 2021-07-24 ENCOUNTER — Encounter (INDEPENDENT_AMBULATORY_CARE_PROVIDER_SITE_OTHER): Payer: Medicare Other | Admitting: Ophthalmology

## 2021-07-24 ENCOUNTER — Other Ambulatory Visit: Payer: Self-pay

## 2021-07-24 DIAGNOSIS — H35033 Hypertensive retinopathy, bilateral: Secondary | ICD-10-CM

## 2021-07-24 DIAGNOSIS — I10 Essential (primary) hypertension: Secondary | ICD-10-CM | POA: Diagnosis not present

## 2021-07-24 DIAGNOSIS — H353231 Exudative age-related macular degeneration, bilateral, with active choroidal neovascularization: Secondary | ICD-10-CM | POA: Diagnosis not present

## 2021-07-24 DIAGNOSIS — H43813 Vitreous degeneration, bilateral: Secondary | ICD-10-CM | POA: Diagnosis not present

## 2021-08-01 DIAGNOSIS — Z7901 Long term (current) use of anticoagulants: Secondary | ICD-10-CM | POA: Diagnosis not present

## 2021-08-01 DIAGNOSIS — Z23 Encounter for immunization: Secondary | ICD-10-CM | POA: Diagnosis not present

## 2021-08-01 DIAGNOSIS — I4891 Unspecified atrial fibrillation: Secondary | ICD-10-CM | POA: Diagnosis not present

## 2021-08-09 ENCOUNTER — Encounter: Payer: Self-pay | Admitting: Podiatry

## 2021-08-09 ENCOUNTER — Other Ambulatory Visit: Payer: Self-pay

## 2021-08-09 ENCOUNTER — Ambulatory Visit (INDEPENDENT_AMBULATORY_CARE_PROVIDER_SITE_OTHER): Payer: Medicare Other | Admitting: Podiatry

## 2021-08-09 DIAGNOSIS — M79675 Pain in left toe(s): Secondary | ICD-10-CM

## 2021-08-09 DIAGNOSIS — M79674 Pain in right toe(s): Secondary | ICD-10-CM

## 2021-08-09 DIAGNOSIS — D689 Coagulation defect, unspecified: Secondary | ICD-10-CM | POA: Diagnosis not present

## 2021-08-09 DIAGNOSIS — B351 Tinea unguium: Secondary | ICD-10-CM | POA: Diagnosis not present

## 2021-08-09 NOTE — Progress Notes (Signed)
This patient returns to my office for at risk foot care.  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  He presents to the office with his son.  This patient is unable to cut nails himself since the patient cannot reach his nails.These nails are painful walking and wearing shoes.  This patient presents for at risk foot care today.  General Appearance  Alert, conversant and in no acute stress.  Vascular  Dorsalis pedis and posterior tibial  pulses are weakly  palpable  bilaterally.  Capillary return is within normal limits  bilaterally. Cold feet.  bilaterally.  Neurologic  Senn-Weinstein monofilament wire test within normal limits  bilaterally. Muscle power within normal limits bilaterally.  Nails Thick disfigured discolored nails with subungual debris  from hallux to fifth toes bilaterally. No evidence of bacterial infection or drainage bilaterally.  Orthopedic  No limitations of motion  feet .  No crepitus or effusions noted.  No bony pathology or digital deformities noted.  HAV  B/L.  Hammer toes 2-5  B/L.  Skin  normotropic skin with no porokeratosis noted bilaterally.  No signs of infections or ulcers noted.     Onychomycosis  Pain in right toes  Pain in left toes  Consent was obtained for treatment procedures.   Mechanical debridement of nails 1-5  bilaterally performed with a nail nipper.  Filed with dremel without incident.    Return office visit  3 months                    Told patient to return for periodic foot care and evaluation due to potential at risk complications.   Gardiner Barefoot DPM

## 2021-08-22 DIAGNOSIS — R5383 Other fatigue: Secondary | ICD-10-CM | POA: Diagnosis not present

## 2021-08-22 DIAGNOSIS — I4891 Unspecified atrial fibrillation: Secondary | ICD-10-CM | POA: Diagnosis not present

## 2021-08-22 DIAGNOSIS — I10 Essential (primary) hypertension: Secondary | ICD-10-CM | POA: Diagnosis not present

## 2021-08-29 DIAGNOSIS — M069 Rheumatoid arthritis, unspecified: Secondary | ICD-10-CM | POA: Diagnosis not present

## 2021-08-29 DIAGNOSIS — I4819 Other persistent atrial fibrillation: Secondary | ICD-10-CM | POA: Diagnosis not present

## 2021-08-29 DIAGNOSIS — D692 Other nonthrombocytopenic purpura: Secondary | ICD-10-CM | POA: Diagnosis not present

## 2021-08-29 DIAGNOSIS — R600 Localized edema: Secondary | ICD-10-CM | POA: Diagnosis not present

## 2021-08-29 DIAGNOSIS — Z0000 Encounter for general adult medical examination without abnormal findings: Secondary | ICD-10-CM | POA: Diagnosis not present

## 2021-08-29 DIAGNOSIS — D6869 Other thrombophilia: Secondary | ICD-10-CM | POA: Diagnosis not present

## 2021-08-29 DIAGNOSIS — E782 Mixed hyperlipidemia: Secondary | ICD-10-CM | POA: Diagnosis not present

## 2021-08-29 DIAGNOSIS — I10 Essential (primary) hypertension: Secondary | ICD-10-CM | POA: Diagnosis not present

## 2021-08-30 DIAGNOSIS — M0589 Other rheumatoid arthritis with rheumatoid factor of multiple sites: Secondary | ICD-10-CM | POA: Diagnosis not present

## 2021-08-30 DIAGNOSIS — Z8781 Personal history of (healed) traumatic fracture: Secondary | ICD-10-CM | POA: Diagnosis not present

## 2021-08-30 DIAGNOSIS — Z6821 Body mass index (BMI) 21.0-21.9, adult: Secondary | ICD-10-CM | POA: Diagnosis not present

## 2021-08-31 NOTE — Progress Notes (Signed)
Cardiology Office Note   Date:  09/01/2021   ID:  [name], DOB 02/02/1923, MRN [id number]  PCP:  Merrilee Seashore, MD  Cardiologist:   Minus Breeding, MD   Chief Complaint  Patient presents with   Atrial Fibrillation       History of Present Illness: [name] is a 87 y.o. male who presents for had been seen by Dr. Wynonia Lawman.   He had atrial fib in 2006. He had cardioversion.  Since I last saw him he has done very well.  He is in flutter which seems to be at a rate of rate of 180 with 3 1 conduction.  His heart rate at home is in the 60s.  He does not have any presyncope or syncope.  He gets around slowly with a walker.  He has not had any chest pressure, neck or arm discomfort.  He had no weight gain or edema.  His hemoglobin which previously I have a low without a source of bleeding has been stable per his report.  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  He lives at home with his 68 year old wife.   Past Medical History:  Diagnosis Date   A-fib [facility])    Dysrhythmia    Atrial Tachycardia with block   Erosive esophagitis    GERD (gastroesophageal reflux disease)    H/O fracture of rib    Left  5th, 6th, 7th had a fall in APRIL 2016   Hypertension    RA (rheumatoid arthritis) ([place])     Past Surgical History:  Procedure Laterality Date   BALLOON DILATION N/A 05/04/2015   Procedure: BALLOON DILATION;  Surgeon: Wilford Corner, MD;  Location: [facility];  Service: Endoscopy;  Laterality: N/A;  janie/melissa   CATARACT EXTRACTION     ESOPHAGOGASTRODUODENOSCOPY N/A 03/12/2015   Procedure: ESOPHAGOGASTRODUODENOSCOPY (EGD);  Surgeon: Wilford Corner, MD;  Location: [facility] ENDOSCOPY;  Service: Endoscopy;  Laterality: N/A;   ESOPHAGOGASTRODUODENOSCOPY N/A 05/04/2015   Procedure: ESOPHAGOGASTRODUODENOSCOPY (EGD);  Surgeon: Wilford Corner, MD;  Location: [facility] ENDOSCOPY;  Service: Endoscopy;  Laterality: N/A;  carm   EYE SURGERY Bilateral  10/2015   HIP ARTHROPLASTY Left 09/29/2017   Procedure: ARTHROPLASTY  HIP HEMIARTHROPLASTY left;  Surgeon: Paralee Cancel, MD;  Location: [facility] ORS;  Service: Orthopedics;  Laterality: Left;   SAVORY DILATION N/A 05/04/2015   Procedure: SAVORY DILATION;  Surgeon: Wilford Corner, MD;  Location: [facility] ENDOSCOPY;  Service: Endoscopy;  Laterality: N/A;     Current Outpatient Medications  Medication Sig Dispense Refill   Besifloxacin HCl 0.6 % SUSP Besivance 0.6 % eye drops,suspension     COMBIGAN 0.2-0.5 % ophthalmic solution Place 1 drop into the right eye 2 (two) times daily.     diltiazem (CARDIZEM CD) 360 MG 24 hr capsule 1 capsule     diltiazem (TIAZAC) 360 MG 24 hr capsule Take 360 mg by mouth daily.  0   docusate sodium (COLACE) 100 MG capsule Take 1 capsule (100 mg total) 2 (two) times daily by mouth. 10 capsule 0   ferrous sulfate 325 (65 FE) MG tablet Take 1 tablet (325 mg total) 3 (three) times daily after meals by mouth.  3   metoprolol succinate (TOPROL-XL) 50 MG 24 hr tablet Take 50 mg by mouth daily.   1   moxifloxacin (VIGAMOX) 0.5 % ophthalmic solution Apply to eye.     Multiple Vitamins-Minerals (PRESERVISION AREDS 2) CAPS Take 1 capsule by mouth  2 (two) times daily.     Omega-3 Fatty Acids (FISH OIL) 1000 MG CAPS Take 1 capsule (1,000 mg total) by mouth 2 (two) times daily.  0   pantoprazole (PROTONIX) 40 MG tablet Take 1 tablet (40 mg total) by mouth 2 (two) times daily.     pantoprazole (PROTONIX) 40 MG tablet Take 1 tablet by mouth daily.     ROCKLATAN 0.02-0.005 % SOLN SMARTSIG:1 Drop(s) Right Eye Every Evening     timolol (TIMOPTIC) 0.25 % ophthalmic solution 1 drop into affected eye     timolol (TIMOPTIC) 0.5 % ophthalmic solution Place 1 drop into the right eye every morning.     warfarin (COUMADIN) 2 MG tablet TAKE 1 TABLET BY MOUTH EVERY DAY EXCEPT ON WEDNESDAY TAKE 2 TABS  3   No current facility-administered medications for this visit.    Allergies:   Shellfish  allergy, Sulfa antibiotics, and Fish allergy    ROS:  Please see the history of present illness.   Otherwise, review of systems are positive for none.   All other systems are reviewed and negative.    PHYSICAL EXAM: VS:  BP 130/68   Pulse 61   Ht 5\' 9"  (1.753 m)   Wt 152 lb 9.6 oz (69.2 kg)   SpO2 98%   BMI 22.54 kg/m  , BMI Body mass index is 22.54 kg/m. GENERAL:  Well appearing for his age NECK:  No jugular venous distention, waveform within normal limits, carotid upstroke brisk and symmetric, no bruits, no thyromegaly LUNGS:  Clear to auscultation bilaterally CHEST:  Unremarkable HEART:  PMI not displaced or sustained,S1 and S2 within normal limits, no S3, no S4, no clicks, no rubs, no murmurs, irregular  ABD:  Flat, positive bowel sounds normal in frequency in pitch, no bruits, no rebound, no guarding, no midline pulsatile mass, no hepatomegaly, no splenomegaly EXT:  2 plus pulses throughout, no edema, no cyanosis no clubbing   EKG:  EKG is  ordered today. Atrial flutter, rate 60, axis within normal limits, intervals within normal limits, poor anterior R wave progression.  Left axis deviation   Recent Labs: No results found for requested labs within last 8760 hours.    Lipid Panel No results found for: CHOL, TRIG, HDL, CHOLHDL, VLDL, LDLCALC, LDLDIRECT    Wt Readings from Last 3 Encounters:  09/01/21 152 lb 9.6 oz (69.2 kg)  09/01/20 154 lb (69.9 kg)  08/25/19 161 lb (73 kg)      Other studies Reviewed: Additional studies/ records that were reviewed today include: Labs Review of the above records demonstrates: See elsewhere   ASSESSMENT AND PLAN:  ATRIAL FIB:   Mr. [name] has a CHA2DS2 - VASc score of  3.  He is actually in flutter with good rate control.  We talked at length about if he starts to go slower or has any presyncope or syncope we will start to back off on the medications but since he is doing so well I will leave him on the meds as listed.  He  really does not have a great desire to switch to a DOAC I do not see a compelling reason.    AI:  This was mild in 2018.  No change in therapy.  I will follow this clinically.   Current medicines are reviewed at length with the patient today.  The patient does not have concerns regarding medicines.  The following changes have been made: None  Labs/ tests ordered today include:  None  Orders Placed This Encounter  Procedures   EKG 12-Lead      Disposition:   FU with me in 1 year.      Signed, Minus Breeding, MD  09/01/2021 12:44 PM    [facility]

## 2021-09-01 ENCOUNTER — Encounter: Payer: Self-pay | Admitting: Cardiology

## 2021-09-01 ENCOUNTER — Other Ambulatory Visit: Payer: Self-pay

## 2021-09-01 ENCOUNTER — Ambulatory Visit (INDEPENDENT_AMBULATORY_CARE_PROVIDER_SITE_OTHER): Payer: Medicare Other | Admitting: Cardiology

## 2021-09-01 VITALS — BP 130/68 | HR 61 | Ht 69.0 in | Wt 152.6 lb

## 2021-09-01 DIAGNOSIS — I4891 Unspecified atrial fibrillation: Secondary | ICD-10-CM | POA: Diagnosis not present

## 2021-09-01 DIAGNOSIS — I351 Nonrheumatic aortic (valve) insufficiency: Secondary | ICD-10-CM | POA: Diagnosis not present

## 2021-09-01 NOTE — Patient Instructions (Signed)
Medication Instructions:  Your physician recommends that you continue on your current medications as directed. Please refer to the Current Medication list given to you today.   *If you need a refill on your cardiac medications before your next appointment, please call your pharmacy*  Lab Work: None   Testing/Procedures: None   Follow-Up: At [facility], you and your health needs are our priority.  As part of our continuing mission to provide you with exceptional heart care, we have created designated Provider Care Teams.  These Care Teams include your primary Cardiologist (physician) and Advanced Practice Providers (APPs -  Physician Assistants and Nurse Practitioners) who all work together to provide you with the care you need, when you need it.  We recommend signing up for the patient portal called "MyChart".  Sign up information is provided on this After Visit Summary.  MyChart is used to connect with patients for Virtual Visits (Telemedicine).  Patients are able to view lab/test results, encounter notes, upcoming appointments, etc.  Non-urgent messages can be sent to your provider as well.   To learn more about what you can do with MyChart, go to [email or web address].    Your next appointment:   12 month(s)  The format for your next appointment:   In Person  Provider:   You may see Minus Breeding, MD or one of the following Advanced Practice Providers on your designated Care Team:   Rosaria Ferries, PA-C Caron Presume, PA-C Jory Sims, DNP, ANP

## 2021-09-25 ENCOUNTER — Encounter (INDEPENDENT_AMBULATORY_CARE_PROVIDER_SITE_OTHER): Payer: Medicare Other | Admitting: Ophthalmology

## 2021-09-25 ENCOUNTER — Other Ambulatory Visit: Payer: Self-pay

## 2021-09-25 DIAGNOSIS — I10 Essential (primary) hypertension: Secondary | ICD-10-CM

## 2021-09-25 DIAGNOSIS — H353231 Exudative age-related macular degeneration, bilateral, with active choroidal neovascularization: Secondary | ICD-10-CM | POA: Diagnosis not present

## 2021-09-25 DIAGNOSIS — H43813 Vitreous degeneration, bilateral: Secondary | ICD-10-CM | POA: Diagnosis not present

## 2021-09-25 DIAGNOSIS — H35033 Hypertensive retinopathy, bilateral: Secondary | ICD-10-CM

## 2021-10-03 DIAGNOSIS — I4891 Unspecified atrial fibrillation: Secondary | ICD-10-CM | POA: Diagnosis not present

## 2021-10-03 DIAGNOSIS — Z7901 Long term (current) use of anticoagulants: Secondary | ICD-10-CM | POA: Diagnosis not present

## 2021-10-31 DIAGNOSIS — Z7901 Long term (current) use of anticoagulants: Secondary | ICD-10-CM | POA: Diagnosis not present

## 2021-10-31 DIAGNOSIS — I4891 Unspecified atrial fibrillation: Secondary | ICD-10-CM | POA: Diagnosis not present

## 2021-11-15 ENCOUNTER — Ambulatory Visit (INDEPENDENT_AMBULATORY_CARE_PROVIDER_SITE_OTHER): Payer: Medicare Other | Admitting: Podiatry

## 2021-11-15 ENCOUNTER — Other Ambulatory Visit: Payer: Self-pay

## 2021-11-15 ENCOUNTER — Encounter: Payer: Self-pay | Admitting: Podiatry

## 2021-11-15 DIAGNOSIS — L989 Disorder of the skin and subcutaneous tissue, unspecified: Secondary | ICD-10-CM

## 2021-11-15 DIAGNOSIS — B351 Tinea unguium: Secondary | ICD-10-CM

## 2021-11-15 DIAGNOSIS — M79675 Pain in left toe(s): Secondary | ICD-10-CM | POA: Diagnosis not present

## 2021-11-15 DIAGNOSIS — M79674 Pain in right toe(s): Secondary | ICD-10-CM

## 2021-11-15 NOTE — Progress Notes (Signed)
This patient returns to my office for at risk foot care.  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  He presents to the office with his next door neighbor.  This patient is unable to cut nails himself since the patient cannot reach his nails.These nails are painful walking and wearing shoes.  He has developed a skin lesion due to his second toenail thickness.  He admits to rubbing his callus on right big toe.  He presents wearing a bandage.This patient presents for at risk foot care today.  General Appearance  Alert, conversant and in no acute stress.  Vascular  Dorsalis pedis and posterior tibial  pulses are weakly  palpable  bilaterally.  Capillary return is within normal limits  bilaterally. Cold feet.  bilaterally.  Neurologic  Senn-Weinstein monofilament wire test within normal limits  bilaterally. Muscle power within normal limits bilaterally.  Nails Thick disfigured discolored nails with subungual debris  from hallux to fifth toes bilaterally. No evidence of bacterial infection or drainage bilaterally.  Orthopedic  No limitations of motion  feet .  No crepitus or effusions noted.  No bony pathology or digital deformities noted.  HAV  B/L.  Hammer toes 2-5  B/L.  Skin  normotropic skin with no porokeratosis noted bilaterally.  No signs of infections or ulcers noted.   Skin lesion 1/2 right foot.  Onychomycosis  Pain in right toes  Pain in left toes  Consent was obtained for treatment procedures.   Mechanical debridement of nails 1-5  bilaterally performed with a nail nipper.  Filed with dremel without incident.  Debrided skin lesion right hallux/DSD with # 15 blade.   Return office visit  10 weeks                    Told patient to return for periodic foot care and evaluation due to potential at risk complications. Shortened his duration between visits.   Gardiner Barefoot DPM

## 2021-11-28 DIAGNOSIS — H35033 Hypertensive retinopathy, bilateral: Secondary | ICD-10-CM | POA: Diagnosis not present

## 2021-11-28 DIAGNOSIS — Z961 Presence of intraocular lens: Secondary | ICD-10-CM | POA: Diagnosis not present

## 2021-11-28 DIAGNOSIS — H3530 Unspecified macular degeneration: Secondary | ICD-10-CM | POA: Diagnosis not present

## 2021-11-28 DIAGNOSIS — H401113 Primary open-angle glaucoma, right eye, severe stage: Secondary | ICD-10-CM | POA: Diagnosis not present

## 2021-12-04 ENCOUNTER — Encounter (INDEPENDENT_AMBULATORY_CARE_PROVIDER_SITE_OTHER): Payer: Medicare Other | Admitting: Ophthalmology

## 2021-12-04 ENCOUNTER — Other Ambulatory Visit: Payer: Self-pay

## 2021-12-04 DIAGNOSIS — H35033 Hypertensive retinopathy, bilateral: Secondary | ICD-10-CM

## 2021-12-04 DIAGNOSIS — H353231 Exudative age-related macular degeneration, bilateral, with active choroidal neovascularization: Secondary | ICD-10-CM

## 2021-12-04 DIAGNOSIS — H43813 Vitreous degeneration, bilateral: Secondary | ICD-10-CM

## 2021-12-04 DIAGNOSIS — I10 Essential (primary) hypertension: Secondary | ICD-10-CM

## 2021-12-12 DIAGNOSIS — I4891 Unspecified atrial fibrillation: Secondary | ICD-10-CM | POA: Diagnosis not present

## 2021-12-12 DIAGNOSIS — Z7901 Long term (current) use of anticoagulants: Secondary | ICD-10-CM | POA: Diagnosis not present

## 2022-01-30 DIAGNOSIS — Z7901 Long term (current) use of anticoagulants: Secondary | ICD-10-CM | POA: Diagnosis not present

## 2022-01-30 DIAGNOSIS — I4891 Unspecified atrial fibrillation: Secondary | ICD-10-CM | POA: Diagnosis not present

## 2022-01-31 ENCOUNTER — Encounter: Payer: Self-pay | Admitting: Podiatry

## 2022-01-31 ENCOUNTER — Other Ambulatory Visit: Payer: Self-pay

## 2022-01-31 ENCOUNTER — Ambulatory Visit (INDEPENDENT_AMBULATORY_CARE_PROVIDER_SITE_OTHER): Payer: Medicare Other | Admitting: Podiatry

## 2022-01-31 DIAGNOSIS — D689 Coagulation defect, unspecified: Secondary | ICD-10-CM | POA: Diagnosis not present

## 2022-01-31 DIAGNOSIS — M79675 Pain in left toe(s): Secondary | ICD-10-CM | POA: Diagnosis not present

## 2022-01-31 DIAGNOSIS — D692 Other nonthrombocytopenic purpura: Secondary | ICD-10-CM | POA: Insufficient documentation

## 2022-01-31 DIAGNOSIS — B351 Tinea unguium: Secondary | ICD-10-CM

## 2022-01-31 DIAGNOSIS — M79674 Pain in right toe(s): Secondary | ICD-10-CM | POA: Diagnosis not present

## 2022-01-31 DIAGNOSIS — R600 Localized edema: Secondary | ICD-10-CM | POA: Insufficient documentation

## 2022-01-31 DIAGNOSIS — D6869 Other thrombophilia: Secondary | ICD-10-CM | POA: Insufficient documentation

## 2022-01-31 NOTE — Progress Notes (Signed)
This patient returns to my office for at risk foot care.  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  He presents to the office with his next door neighbor.  This patient is unable to cut nails himself since the patient cannot reach his nails.These nails are painful walking and wearing shoes.  He has developed a skin lesion due to his second toenail thickness.  He admits to rubbing his callus on right big toe.  He presents wearing a bandage.This patient presents for at risk foot care today. ? ?General Appearance  Alert, conversant and in no acute stress. ? ?Vascular  Dorsalis pedis and posterior tibial  pulses are weakly  palpable  bilaterally.  Capillary return is within normal limits  bilaterally. Cold feet.  bilaterally. ? ?Neurologic  Senn-Weinstein monofilament wire test within normal limits  bilaterally. Muscle power within normal limits bilaterally. ? ?Nails Thick disfigured discolored nails with subungual debris  from hallux to fifth toes bilaterally. No evidence of bacterial infection or drainage bilaterally. ? ?Orthopedic  No limitations of motion  feet .  No crepitus or effusions noted.  No bony pathology or digital deformities noted.  HAV  B/L.  Hammer toes 2-5  B/L. ? ?Skin  normotropic skin with no porokeratosis noted bilaterally.  No signs of infections or ulcers noted.   Skin lesion 1/2 right foot. ? ?Onychomycosis  Pain in right toes  Pain in left toes ? ?Consent was obtained for treatment procedures.   Mechanical debridement of nails 1-5  bilaterally performed with a nail nipper.  Filed with dremel without incident.  Padding dispensed. ? ? ?Return office visit  10 weeks                    Told patient to return for periodic foot care and evaluation due to potential at risk complications. Shortened his duration between visits. ? ? ?Gardiner Barefoot DPM  ?

## 2022-02-12 ENCOUNTER — Encounter (INDEPENDENT_AMBULATORY_CARE_PROVIDER_SITE_OTHER): Payer: Medicare Other | Admitting: Ophthalmology

## 2022-02-12 DIAGNOSIS — I10 Essential (primary) hypertension: Secondary | ICD-10-CM | POA: Diagnosis not present

## 2022-02-12 DIAGNOSIS — H35033 Hypertensive retinopathy, bilateral: Secondary | ICD-10-CM | POA: Diagnosis not present

## 2022-02-12 DIAGNOSIS — H43813 Vitreous degeneration, bilateral: Secondary | ICD-10-CM | POA: Diagnosis not present

## 2022-02-12 DIAGNOSIS — H353231 Exudative age-related macular degeneration, bilateral, with active choroidal neovascularization: Secondary | ICD-10-CM

## 2022-02-27 DIAGNOSIS — Z7901 Long term (current) use of anticoagulants: Secondary | ICD-10-CM | POA: Diagnosis not present

## 2022-02-27 DIAGNOSIS — I4891 Unspecified atrial fibrillation: Secondary | ICD-10-CM | POA: Diagnosis not present

## 2022-03-20 ENCOUNTER — Emergency Department (HOSPITAL_BASED_OUTPATIENT_CLINIC_OR_DEPARTMENT_OTHER)
Admission: EM | Admit: 2022-03-20 | Discharge: 2022-03-20 | Disposition: A | Discharge status: Home / Self Care | Payer: Medicare Other | Attending: Emergency Medicine | Admitting: Emergency Medicine

## 2022-03-20 ENCOUNTER — Encounter (HOSPITAL_BASED_OUTPATIENT_CLINIC_OR_DEPARTMENT_OTHER): Payer: Self-pay

## 2022-03-20 DIAGNOSIS — Z79899 Other long term (current) drug therapy: Secondary | ICD-10-CM | POA: Insufficient documentation

## 2022-03-20 DIAGNOSIS — R040 Epistaxis: Secondary | ICD-10-CM | POA: Diagnosis not present

## 2022-03-20 DIAGNOSIS — I10 Essential (primary) hypertension: Secondary | ICD-10-CM | POA: Insufficient documentation

## 2022-03-20 DIAGNOSIS — Z7901 Long term (current) use of anticoagulants: Secondary | ICD-10-CM | POA: Insufficient documentation

## 2022-03-20 DIAGNOSIS — R0981 Nasal congestion: Secondary | ICD-10-CM | POA: Diagnosis not present

## 2022-03-20 DIAGNOSIS — R58 Hemorrhage, not elsewhere classified: Secondary | ICD-10-CM | POA: Diagnosis not present

## 2022-03-20 LAB — CBC WITH DIFFERENTIAL/PLATELET
Abs Immature Granulocytes: 0.04 10*3/uL (ref 0.00–0.07)
Basophils Absolute: 0 10*3/uL (ref 0.0–0.1)
Basophils Relative: 0 %
Eosinophils Absolute: 0.1 10*3/uL (ref 0.0–0.5)
Eosinophils Relative: 3 %
HCT: 33.7 % — ABNORMAL LOW (ref 39.0–52.0)
Hemoglobin: 11.4 g/dL — ABNORMAL LOW (ref 13.0–17.0)
Immature Granulocytes: 1 %
Lymphocytes Relative: 9 %
Lymphs Abs: 0.4 10*3/uL — ABNORMAL LOW (ref 0.7–4.0)
MCH: 33 pg (ref 26.0–34.0)
MCHC: 33.8 g/dL (ref 30.0–36.0)
MCV: 97.7 fL (ref 80.0–100.0)
Monocytes Absolute: 0.7 10*3/uL (ref 0.1–1.0)
Monocytes Relative: 15 %
Neutro Abs: 3.3 10*3/uL (ref 1.7–7.7)
Neutrophils Relative %: 72 %
Platelets: 206 10*3/uL (ref 150–400)
RBC: 3.45 MIL/uL — ABNORMAL LOW (ref 4.22–5.81)
RDW: 14.3 % (ref 11.5–15.5)
WBC: 4.6 10*3/uL (ref 4.0–10.5)
nRBC: 0 % (ref 0.0–0.2)

## 2022-03-20 LAB — PROTIME-INR
INR: 2.6 — ABNORMAL HIGH (ref 0.8–1.2)
Prothrombin Time: 27.9 seconds — ABNORMAL HIGH (ref 11.4–15.2)

## 2022-03-20 LAB — BASIC METABOLIC PANEL
Anion gap: 9 (ref 5–15)
BUN: 26 mg/dL — ABNORMAL HIGH (ref 8–23)
CO2: 23 mmol/L (ref 22–32)
Calcium: 8.9 mg/dL (ref 8.9–10.3)
Chloride: 102 mmol/L (ref 98–111)
Creatinine, Ser: 1.16 mg/dL (ref 0.61–1.24)
GFR, Estimated: 58 mL/min — ABNORMAL LOW (ref >60)
Glucose, Bld: 100 mg/dL — ABNORMAL HIGH (ref 70–99)
Potassium: 4.4 mmol/L (ref 3.5–5.1)
Sodium: 134 mmol/L — ABNORMAL LOW (ref 135–145)

## 2022-03-20 NOTE — ED Triage Notes (Signed)
Nose bleed onset 0500 am EMS call and got bleeding stopped.  Recalled back to patient due to restarting of bleeding then brought in to ER.  Bleeding controlled at present Afrin given.  Present with packing to left nose  ?

## 2022-03-20 NOTE — Discharge Instructions (Addendum)
If the bleeding restarts apply the nose pincher for 20 minutes by the clock.  Then remove it if it does not stop the bleeding then blow your nose extensively and reapply it for another 20 minutes.  If that does not stop it then come in to get seen again.  Follow-up with ear nose and throat as needed.  Your Coumadin level was therapeutic today. ?

## 2022-03-20 NOTE — ED Provider Notes (Addendum)
?[facility] ?Provider Note ? ? ?CSN: [id number] ?Arrival date & time: 03/20/22  0807 ? ?  ? ?History ? ?Chief Complaint  ?Patient presents with  ? Epistaxis  ? ? ?[name] is a 88 y.o. male. ? ?Patient got up had some nasal congestion at about 530 this morning.  Blew his nose was trying to clean it out and then the left nares started to bleed.  EMS was called EMS came out to the house they stopped the bleeding.  Patient then started to rebleed EMS called again they put some gauze packing in there and they brought him in.  Patient is on Coumadin for atrial fibrillation also has a history of hypertension.  He has not taken any of his meds today.  For his hypertension and his atrial fibs he is on Cardizem CD and metoprolol.  Patient without any other complaints.  The gauze packing to the left nares has stopped the bleeding.  No blood going down the back of the throat.  Patient has not had any nosebleed problems now for a while.  Patient thinks that he probably traumatized his nose trying to clean it out and is probably what started the bleeding. ? ? ?  ? ?Home Medications ?Prior to Admission medications   ?Medication Sig Start Date End Date Taking? Authorizing Provider  ?Besifloxacin HCl 0.6 % SUSP Besivance 0.6 % eye drops,suspension    [provider]  ?COMBIGAN 0.2-0.5 % ophthalmic solution Place 1 drop into the right eye 2 (two) times daily. 12/15/19   [provider]  ?diltiazem (CARDIZEM CD) 360 MG 24 hr capsule 1 capsule    [provider]  ?diltiazem (TIAZAC) 360 MG 24 hr capsule Take 360 mg by mouth daily. 02/20/15   [provider]  ?docusate sodium (COLACE) 100 MG capsule Take 1 capsule (100 mg total) 2 (two) times daily by mouth. 10/01/17   Danae Orleans, PA-C  ?ferrous sulfate 325 (65 FE) MG tablet Take 1 tablet (325 mg total) 3 (three) times daily after meals by mouth. 10/01/17   Danae Orleans, PA-C  ?metoprolol succinate (TOPROL-XL) 50  MG 24 hr tablet Take 50 mg by mouth daily.  04/08/15   [provider]  ?moxifloxacin (VIGAMOX) 0.5 % ophthalmic solution Apply to eye. 01/26/21   [provider]  ?Multiple Vitamins-Minerals (PRESERVISION AREDS 2) CAPS Take 1 capsule by mouth 2 (two) times daily.    [provider]  ?Omega-3 Fatty Acids (FISH OIL) 1000 MG CAPS Take 1 capsule (1,000 mg total) by mouth 2 (two) times daily. 10/04/17   Patrecia Pour, MD  ?pantoprazole (PROTONIX) 40 MG tablet Take 1 tablet (40 mg total) by mouth 2 (two) times daily. 10/04/17   Patrecia Pour, MD  ?pantoprazole (PROTONIX) 40 MG tablet Take 1 tablet by mouth daily.    [provider]  ?ROCKLATAN 0.02-0.005 % SOLN SMARTSIG:1 Drop(s) Right Eye Every Evening 05/17/20   [provider]  ?timolol (TIMOPTIC) 0.25 % ophthalmic solution 1 drop into affected eye    [provider]  ?timolol (TIMOPTIC) 0.5 % ophthalmic solution Place 1 drop into the right eye every morning. 12/05/20   [provider]  ?warfarin (COUMADIN) 2 MG tablet TAKE 1 TABLET BY MOUTH EVERY DAY EXCEPT ON WEDNESDAY TAKE 2 TABS 09/22/17   [provider]  ?   ? ?Allergies    ?Shellfish allergy, Sulfa antibiotics, and Fish allergy   ? ?Review of Systems   ?Review of  Systems  ?Constitutional:  Negative for chills and fever.  ?HENT:  Positive for nosebleeds. Negative for ear pain and sore throat.   ?Eyes:  Negative for pain and visual disturbance.  ?Respiratory:  Negative for cough and shortness of breath.   ?Cardiovascular:  Negative for chest pain and palpitations.  ?Gastrointestinal:  Negative for abdominal pain and vomiting.  ?Genitourinary:  Negative for dysuria and hematuria.  ?Musculoskeletal:  Negative for arthralgias and back pain.  ?Skin:  Negative for color change and rash.  ?Neurological:  Negative for seizures and syncope.  ?All other systems reviewed and are negative. ? ?Physical Exam ?Updated Vital Signs ?BP (!) 157/96   Pulse 88    Temp 98.6 ?F (37 ?C) (Oral)   Resp 16   Ht 1.753 m ('5\' 9"'$ )   Wt 70.3 kg   SpO2 97%   BMI 22.89 kg/m?  ?Physical Exam ?Vitals and nursing note reviewed.  ?Constitutional:   ?   General: He is not in acute distress. ?   Appearance: Normal appearance. He is well-developed.  ?HENT:  ?   Head: Normocephalic and atraumatic.  ?   Nose:  ?   Comments: Right nares without any bleeding.  Left nares bleeding is now stopped after gauze removed.  An area of the anterior part with actually more in the skin area with potential abrasion may be from his fingernail.  But no active bleeding. ?   Mouth/Throat:  ?   Pharynx: Oropharynx is clear.  ?   Comments: No blood running down the back of the throat ?Eyes:  ?   Extraocular Movements: Extraocular movements intact.  ?   Conjunctiva/sclera: Conjunctivae normal.  ?   Comments: Patient with some erythema to the sclera of the right eye.  He has some macular degeneration in that area.  No significant change today.  ?Cardiovascular:  ?   Rate and Rhythm: Normal rate and regular rhythm.  ?   Heart sounds: No murmur heard. ?Pulmonary:  ?   Effort: Pulmonary effort is normal. No respiratory distress.  ?   Breath sounds: Normal breath sounds.  ?Abdominal:  ?   Palpations: Abdomen is soft.  ?   Tenderness: There is no abdominal tenderness.  ?Musculoskeletal:     ?   General: No swelling.  ?   Cervical back: Neck supple.  ?   Right lower leg: No edema.  ?   Left lower leg: No edema.  ?Skin: ?   General: Skin is warm and dry.  ?   Capillary Refill: Capillary refill takes less than 2 seconds.  ?Neurological:  ?   General: No focal deficit present.  ?   Mental Status: He is alert and oriented to person, place, and time.  ?Psychiatric:     ?   Mood and Affect: Mood normal.  ? ? ?ED Results / Procedures / Treatments   ?Labs ?(all labs ordered are listed, but only abnormal results are displayed) ?Labs Reviewed  ?CBC WITH DIFFERENTIAL/PLATELET  ?BASIC METABOLIC PANEL  ?PROTIME-INR   ? ? ?EKG ?None ? ?Radiology ?No results found. ? ?Procedures ?Procedures  ? ? ?Medications Ordered in ED ?Medications - No data to display ? ?ED Course/ Medical Decision Making/ A&P ?  ?                        ?Medical Decision Making ?Amount and/or Complexity of Data Reviewed ?Labs: ordered. ? ? ?Since patient on Coumadin will check INR.  And will check CBC and basic metabolic panel.  Gauze removed no active bleeding.  We will recheck if it starts to bleed again patient given a nose clamp.  No active bleeding currently.  All the bleeding was from the left nares.  Probably right at the opening of the nares where he may have caused a abrasion with his fingernail.  Was probably the source of the bleeding. ? ?Bleeding is remained controlled.  Without any clamping.  The hemoglobin 11.4 white blood cell count 4.6.  Basic metabolic panel normal.  Other than GFR being slightly down to 58.  Potassium normal at 4.4.  INR therapeutic at 2.6. ? ?Patient stable for discharge home can follow-up with ear nose and throat.  Patient given instructions on what to do if the bleeding recurs.  Nose pincher provided. ? ?Final Clinical Impression(s) / ED Diagnoses ?Final diagnoses:  ?Epistaxis  ? ? ?Rx / DC Orders ?ED Discharge Orders   ? ? None  ? ?  ? ? ?  ?Fredia Sorrow, MD ?03/20/22 [phone] ? ?  ?Fredia Sorrow, MD ?03/20/22 1118 ? ?

## 2022-03-20 NOTE — ED Notes (Addendum)
Spoke with son to arrange transport.  States sons wife will be coming to transport  ?

## 2022-03-28 DIAGNOSIS — H3530 Unspecified macular degeneration: Secondary | ICD-10-CM | POA: Diagnosis not present

## 2022-03-28 DIAGNOSIS — Z961 Presence of intraocular lens: Secondary | ICD-10-CM | POA: Diagnosis not present

## 2022-03-28 DIAGNOSIS — H35033 Hypertensive retinopathy, bilateral: Secondary | ICD-10-CM | POA: Diagnosis not present

## 2022-03-28 DIAGNOSIS — H401113 Primary open-angle glaucoma, right eye, severe stage: Secondary | ICD-10-CM | POA: Diagnosis not present

## 2022-04-13 ENCOUNTER — Ambulatory Visit: Payer: Medicare Other | Admitting: Podiatry

## 2022-04-17 DIAGNOSIS — Z7901 Long term (current) use of anticoagulants: Secondary | ICD-10-CM | POA: Diagnosis not present

## 2022-04-17 DIAGNOSIS — I4819 Other persistent atrial fibrillation: Secondary | ICD-10-CM | POA: Diagnosis not present

## 2022-04-17 DIAGNOSIS — I10 Essential (primary) hypertension: Secondary | ICD-10-CM | POA: Diagnosis not present

## 2022-04-23 ENCOUNTER — Encounter: Payer: Self-pay | Admitting: Podiatry

## 2022-04-23 ENCOUNTER — Ambulatory Visit (INDEPENDENT_AMBULATORY_CARE_PROVIDER_SITE_OTHER): Payer: Medicare Other | Admitting: Podiatry

## 2022-04-23 DIAGNOSIS — M79675 Pain in left toe(s): Secondary | ICD-10-CM | POA: Diagnosis not present

## 2022-04-23 DIAGNOSIS — B351 Tinea unguium: Secondary | ICD-10-CM

## 2022-04-23 DIAGNOSIS — M79674 Pain in right toe(s): Secondary | ICD-10-CM | POA: Diagnosis not present

## 2022-04-23 DIAGNOSIS — D689 Coagulation defect, unspecified: Secondary | ICD-10-CM

## 2022-04-23 NOTE — Progress Notes (Signed)
This patient returns to my office for at risk foot care.  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  He presents to the office with a male caregiver.  This patient is unable to cut nails himself since the patient cannot reach his nails.These nails are painful walking and wearing shoes.  He has developed a skin lesion between his 1/2 toes right foot.  He admits to rubbing his callus on right big toe. He also says he has been looking after his wife and has not taken care of his feet.This patient presents for at risk foot care today.  General Appearance  Alert, conversant and in no acute stress.  Vascular  Dorsalis pedis and posterior tibial  pulses are weakly  palpable  bilaterally.  Capillary return is within normal limits  bilaterally. Cold feet.  bilaterally.  Neurologic  Senn-Weinstein monofilament wire test within normal limits  bilaterally. Muscle power within normal limits bilaterally.  Nails Thick disfigured discolored nails with subungual debris  from hallux to fifth toes bilaterally. No evidence of bacterial infection or drainage bilaterally.  Orthopedic  No limitations of motion  feet .  No crepitus or effusions noted.  No bony pathology or digital deformities noted.  HAV  B/L.  Hammer toes 2-5  B/L.  Skin  normotropic skin with no porokeratosis noted bilaterally.  No signs of infections or ulcers noted.   Skin lesion 1/2 right foot.  Onychomycosis  Pain in right toes  Pain in left toes  Consent was obtained for treatment procedures.   Mechanical debridement of nails 1-5  bilaterally performed with a nail nipper.  Filed with dremel without incident.  Padding dispensed.  Cauterization nail bed right hallux  DSD.   Return office visit  10 weeks                    Told patient to return for periodic foot care and evaluation due to potential at risk complications.    Gardiner Barefoot DPM

## 2022-04-26 DIAGNOSIS — Z7901 Long term (current) use of anticoagulants: Secondary | ICD-10-CM | POA: Diagnosis not present

## 2022-04-30 ENCOUNTER — Encounter (INDEPENDENT_AMBULATORY_CARE_PROVIDER_SITE_OTHER): Payer: Medicare Other | Admitting: Ophthalmology

## 2022-04-30 DIAGNOSIS — H43813 Vitreous degeneration, bilateral: Secondary | ICD-10-CM | POA: Diagnosis not present

## 2022-04-30 DIAGNOSIS — H353231 Exudative age-related macular degeneration, bilateral, with active choroidal neovascularization: Secondary | ICD-10-CM

## 2022-04-30 DIAGNOSIS — I10 Essential (primary) hypertension: Secondary | ICD-10-CM | POA: Diagnosis not present

## 2022-04-30 DIAGNOSIS — H35033 Hypertensive retinopathy, bilateral: Secondary | ICD-10-CM

## 2022-05-01 ENCOUNTER — Inpatient Hospital Stay (HOSPITAL_BASED_OUTPATIENT_CLINIC_OR_DEPARTMENT_OTHER)
Admission: EM | Admit: 2022-05-01 | Discharge: 2022-05-05 | DRG: 536 | Disposition: A | Discharge status: Skilled Nursing Facility | Payer: Medicare Other | Attending: Internal Medicine | Admitting: Internal Medicine

## 2022-05-01 ENCOUNTER — Emergency Department (HOSPITAL_BASED_OUTPATIENT_CLINIC_OR_DEPARTMENT_OTHER): Payer: Medicare Other

## 2022-05-01 ENCOUNTER — Other Ambulatory Visit: Payer: Self-pay

## 2022-05-01 ENCOUNTER — Emergency Department (HOSPITAL_BASED_OUTPATIENT_CLINIC_OR_DEPARTMENT_OTHER): Payer: Medicare Other | Admitting: Radiology

## 2022-05-01 DIAGNOSIS — S20212A Contusion of left front wall of thorax, initial encounter: Secondary | ICD-10-CM | POA: Diagnosis not present

## 2022-05-01 DIAGNOSIS — Y93F2 Activity, caregiving, lifting: Secondary | ICD-10-CM | POA: Diagnosis not present

## 2022-05-01 DIAGNOSIS — N179 Acute kidney failure, unspecified: Secondary | ICD-10-CM | POA: Diagnosis present

## 2022-05-01 DIAGNOSIS — Z882 Allergy status to sulfonamides status: Secondary | ICD-10-CM

## 2022-05-01 DIAGNOSIS — R2689 Other abnormalities of gait and mobility: Secondary | ICD-10-CM | POA: Diagnosis not present

## 2022-05-01 DIAGNOSIS — Z79899 Other long term (current) drug therapy: Secondary | ICD-10-CM | POA: Diagnosis not present

## 2022-05-01 DIAGNOSIS — X500XXA Overexertion from strenuous movement or load, initial encounter: Secondary | ICD-10-CM

## 2022-05-01 DIAGNOSIS — I482 Chronic atrial fibrillation, unspecified: Secondary | ICD-10-CM

## 2022-05-01 DIAGNOSIS — Z96642 Presence of left artificial hip joint: Secondary | ICD-10-CM | POA: Diagnosis present

## 2022-05-01 DIAGNOSIS — D631 Anemia in chronic kidney disease: Secondary | ICD-10-CM | POA: Diagnosis not present

## 2022-05-01 DIAGNOSIS — Z7901 Long term (current) use of anticoagulants: Secondary | ICD-10-CM | POA: Diagnosis not present

## 2022-05-01 DIAGNOSIS — S32401K Unspecified fracture of right acetabulum, subsequent encounter for fracture with nonunion: Secondary | ICD-10-CM | POA: Diagnosis not present

## 2022-05-01 DIAGNOSIS — M069 Rheumatoid arthritis, unspecified: Secondary | ICD-10-CM | POA: Diagnosis present

## 2022-05-01 DIAGNOSIS — S32401A Unspecified fracture of right acetabulum, initial encounter for closed fracture: Principal | ICD-10-CM | POA: Diagnosis present

## 2022-05-01 DIAGNOSIS — Z823 Family history of stroke: Secondary | ICD-10-CM | POA: Diagnosis not present

## 2022-05-01 DIAGNOSIS — I129 Hypertensive chronic kidney disease with stage 1 through stage 4 chronic kidney disease, or unspecified chronic kidney disease: Secondary | ICD-10-CM | POA: Diagnosis present

## 2022-05-01 DIAGNOSIS — S32409A Unspecified fracture of unspecified acetabulum, initial encounter for closed fracture: Secondary | ICD-10-CM | POA: Diagnosis present

## 2022-05-01 DIAGNOSIS — R609 Edema, unspecified: Secondary | ICD-10-CM | POA: Diagnosis not present

## 2022-05-01 DIAGNOSIS — D649 Anemia, unspecified: Secondary | ICD-10-CM | POA: Diagnosis not present

## 2022-05-01 DIAGNOSIS — D539 Nutritional anemia, unspecified: Secondary | ICD-10-CM | POA: Diagnosis present

## 2022-05-01 DIAGNOSIS — Z9181 History of falling: Secondary | ICD-10-CM

## 2022-05-01 DIAGNOSIS — F17290 Nicotine dependence, other tobacco product, uncomplicated: Secondary | ICD-10-CM | POA: Diagnosis present

## 2022-05-01 DIAGNOSIS — R41841 Cognitive communication deficit: Secondary | ICD-10-CM | POA: Diagnosis not present

## 2022-05-01 DIAGNOSIS — K219 Gastro-esophageal reflux disease without esophagitis: Secondary | ICD-10-CM | POA: Diagnosis present

## 2022-05-01 DIAGNOSIS — I4821 Permanent atrial fibrillation: Secondary | ICD-10-CM | POA: Diagnosis present

## 2022-05-01 DIAGNOSIS — M25551 Pain in right hip: Secondary | ICD-10-CM

## 2022-05-01 DIAGNOSIS — M8580 Other specified disorders of bone density and structure, unspecified site: Secondary | ICD-10-CM | POA: Diagnosis not present

## 2022-05-01 DIAGNOSIS — R600 Localized edema: Secondary | ICD-10-CM | POA: Diagnosis present

## 2022-05-01 DIAGNOSIS — R1312 Dysphagia, oropharyngeal phase: Secondary | ICD-10-CM | POA: Diagnosis not present

## 2022-05-01 DIAGNOSIS — N1831 Chronic kidney disease, stage 3a: Secondary | ICD-10-CM | POA: Diagnosis not present

## 2022-05-01 DIAGNOSIS — M4316 Spondylolisthesis, lumbar region: Secondary | ICD-10-CM | POA: Diagnosis not present

## 2022-05-01 DIAGNOSIS — S32491D Other specified fracture of right acetabulum, subsequent encounter for fracture with routine healing: Secondary | ICD-10-CM | POA: Diagnosis not present

## 2022-05-01 DIAGNOSIS — R29898 Other symptoms and signs involving the musculoskeletal system: Secondary | ICD-10-CM

## 2022-05-01 DIAGNOSIS — Z66 Do not resuscitate: Secondary | ICD-10-CM | POA: Diagnosis not present

## 2022-05-01 DIAGNOSIS — R262 Difficulty in walking, not elsewhere classified: Secondary | ICD-10-CM | POA: Diagnosis not present

## 2022-05-01 DIAGNOSIS — Z471 Aftercare following joint replacement surgery: Secondary | ICD-10-CM | POA: Diagnosis not present

## 2022-05-01 DIAGNOSIS — Z7401 Bed confinement status: Secondary | ICD-10-CM | POA: Diagnosis not present

## 2022-05-01 DIAGNOSIS — R531 Weakness: Secondary | ICD-10-CM | POA: Diagnosis not present

## 2022-05-01 DIAGNOSIS — R791 Abnormal coagulation profile: Secondary | ICD-10-CM | POA: Diagnosis not present

## 2022-05-01 DIAGNOSIS — M6281 Muscle weakness (generalized): Secondary | ICD-10-CM | POA: Diagnosis not present

## 2022-05-01 DIAGNOSIS — I10 Essential (primary) hypertension: Secondary | ICD-10-CM | POA: Diagnosis not present

## 2022-05-01 LAB — CBC
HCT: 28.5 % — ABNORMAL LOW (ref 39.0–52.0)
Hemoglobin: 9.2 g/dL — ABNORMAL LOW (ref 13.0–17.0)
MCH: 32.1 pg (ref 26.0–34.0)
MCHC: 32.3 g/dL (ref 30.0–36.0)
MCV: 99.3 fL (ref 80.0–100.0)
Platelets: 211 10*3/uL (ref 150–400)
RBC: 2.87 MIL/uL — ABNORMAL LOW (ref 4.22–5.81)
RDW: 15.6 % — ABNORMAL HIGH (ref 11.5–15.5)
WBC: 8.5 10*3/uL (ref 4.0–10.5)
nRBC: 0 % (ref 0.0–0.2)

## 2022-05-01 LAB — BASIC METABOLIC PANEL
Anion gap: 9 (ref 5–15)
BUN: 27 mg/dL — ABNORMAL HIGH (ref 8–23)
CO2: 23 mmol/L (ref 22–32)
Calcium: 8.9 mg/dL (ref 8.9–10.3)
Chloride: 106 mmol/L (ref 98–111)
Creatinine, Ser: 1.33 mg/dL — ABNORMAL HIGH (ref 0.61–1.24)
GFR, Estimated: 49 mL/min — ABNORMAL LOW (ref >60)
Glucose, Bld: 111 mg/dL — ABNORMAL HIGH (ref 70–99)
Potassium: 4.7 mmol/L (ref 3.5–5.1)
Sodium: 138 mmol/L (ref 135–145)

## 2022-05-01 LAB — CBG MONITORING, ED: Glucose-Capillary: 110 mg/dL — ABNORMAL HIGH (ref 70–99)

## 2022-05-01 IMAGING — CT CT HIP*R* W/O CM
3 of 4 series · 13 of 33 positions shown, 16 images · non-contrast
Comparison: Plain film from earlier in the same day.

CLINICAL DATA: Irregularity of right superior pubic ramus on recent
plain film

EXAM:
CT OF THE RIGHT HIP WITHOUT CONTRAST
TECHNIQUE: Multidetector CT imaging of the right hip was performed according to
the standard protocol. Multiplanar CT image reconstructions were
also generated.
RADIATION DOSE REDUCTION: This exam was performed according to the
departmental dose-optimization program which includes automated
exposure control, adjustment of the mA and/or kV according to
patient size and/or use of iterative reconstruction technique.

[Series 7: thin soft · axial · 0.47mm/px · z∈[+716,+941]mm · 5 of 563 slices shown, 7 images]
[im 57/563  soft-tissue]
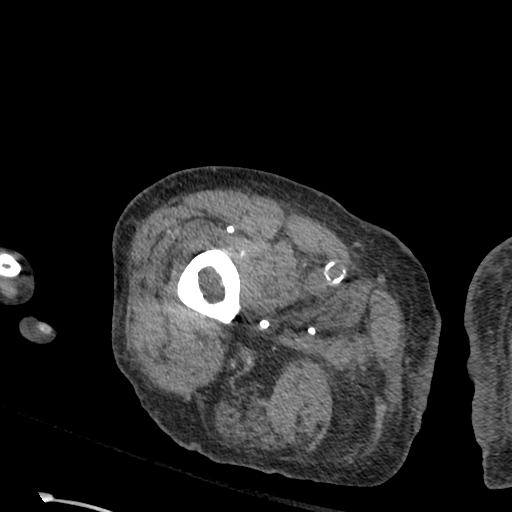
[im 57/563  bone]
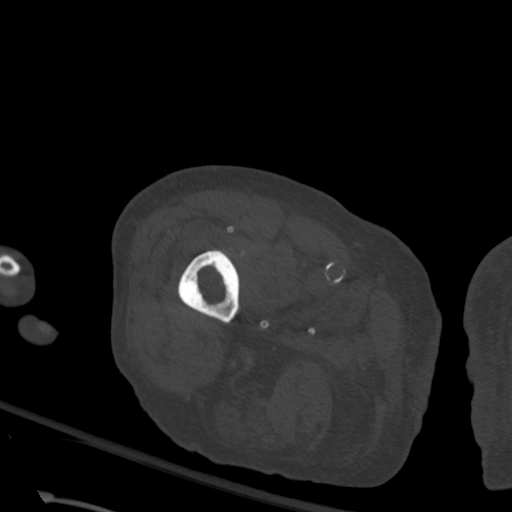
[im 169/563  bone]
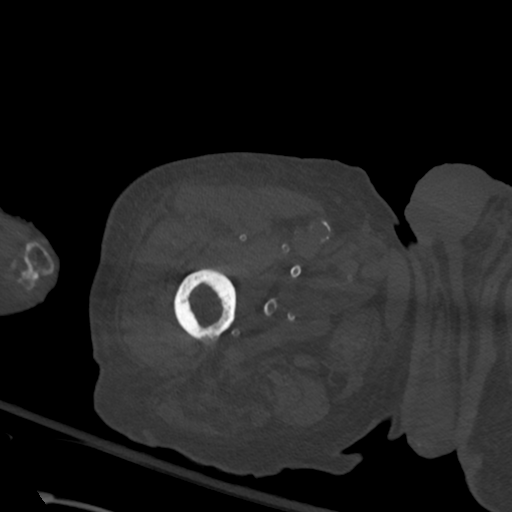
[im 282/563  bone]
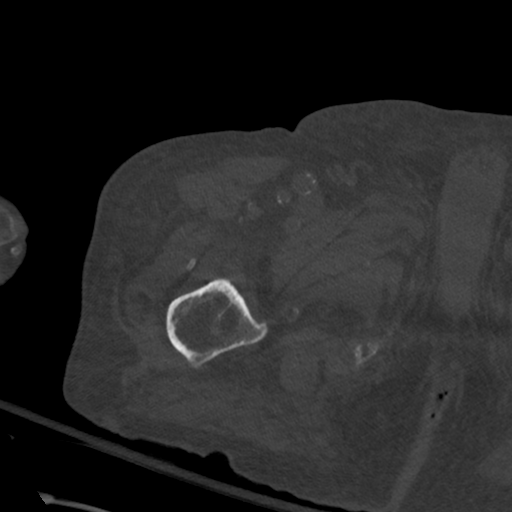
[im 394/563  bone]
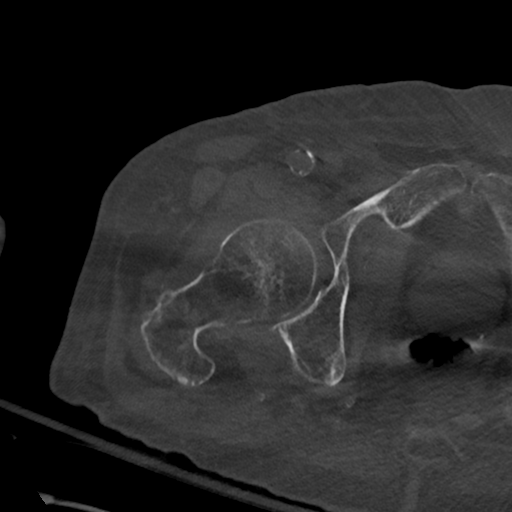
[im 506/563  soft-tissue]
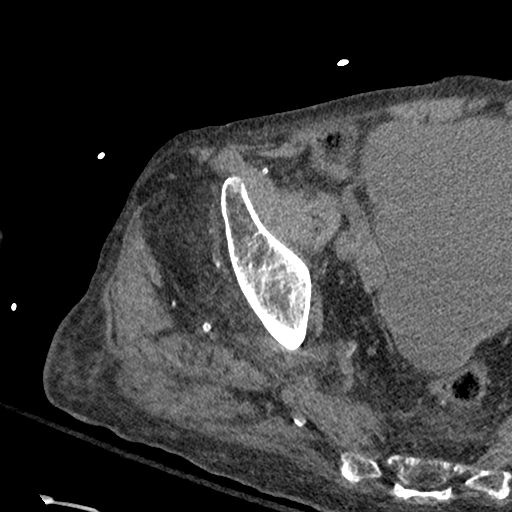
[im 506/563  bone]
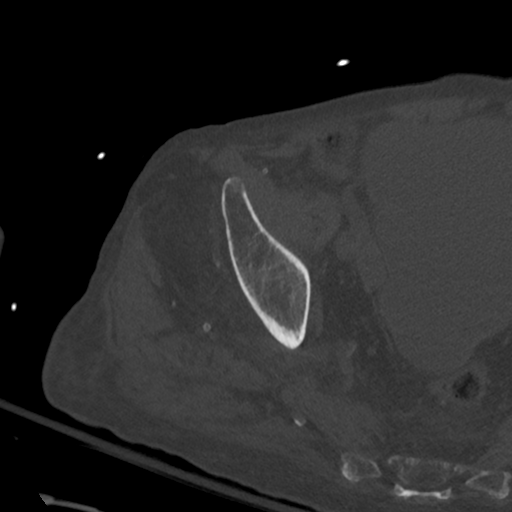

[Series 11: coronal soft · coronal · 0.36mm/px · 3 of 107 slices shown]
[im 22/107  bone]
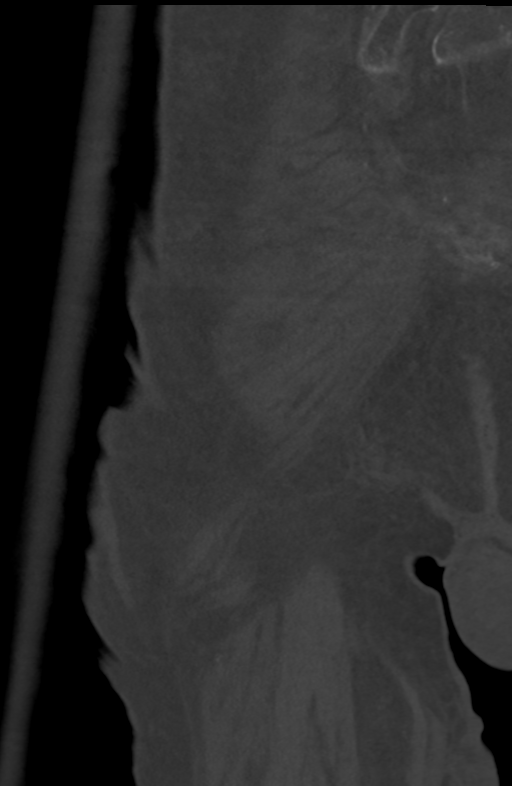
[im 43/107  bone]
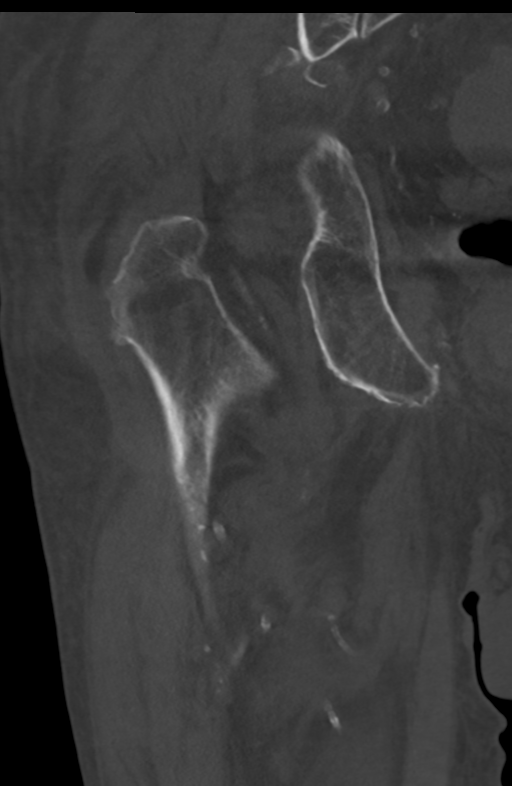
[im 64/107  bone]
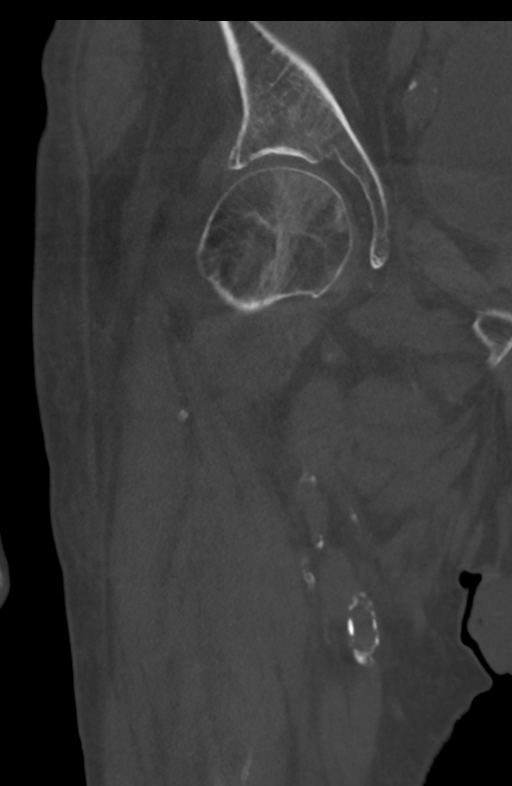

[Series 12: sagittal soft · sagittal · 0.42mm/px · 5 of 92 slices shown, 6 images]
[im 31/92  bone]
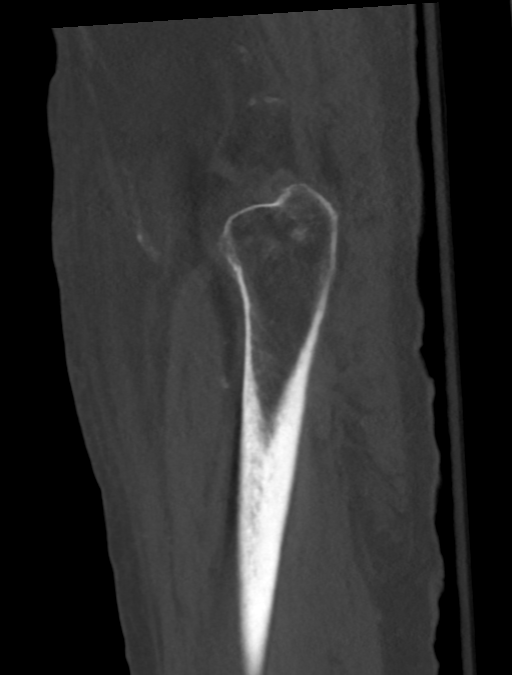
[im 38/92  bone]
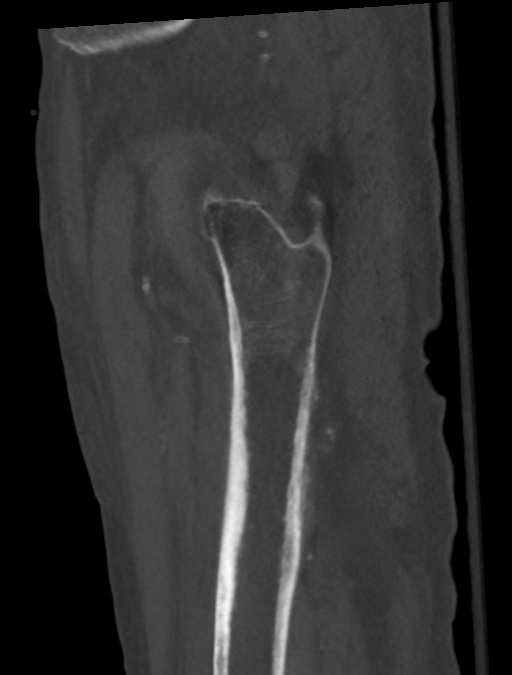
[im 46/92  soft-tissue]
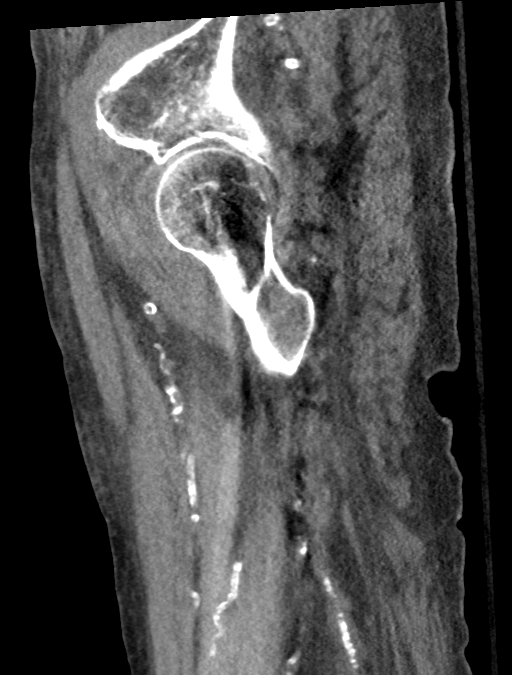
[im 46/92  bone]
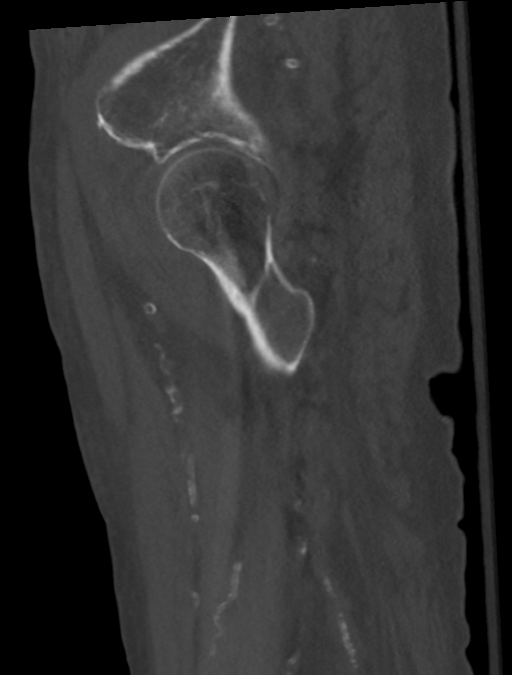
[im 54/92  bone]
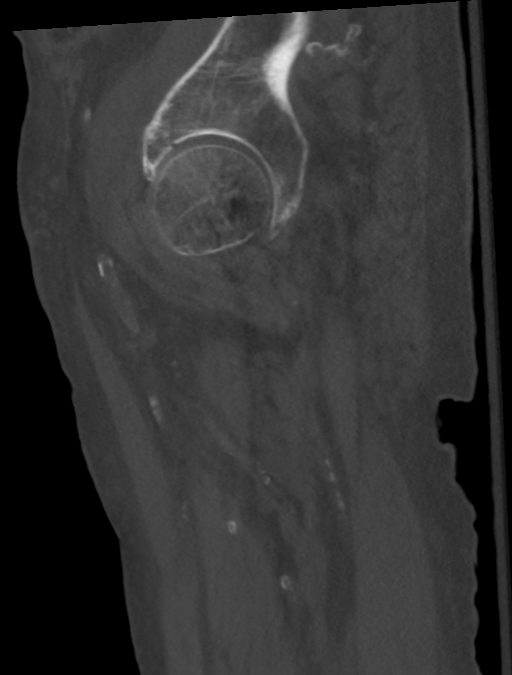
[im 61/92  bone]
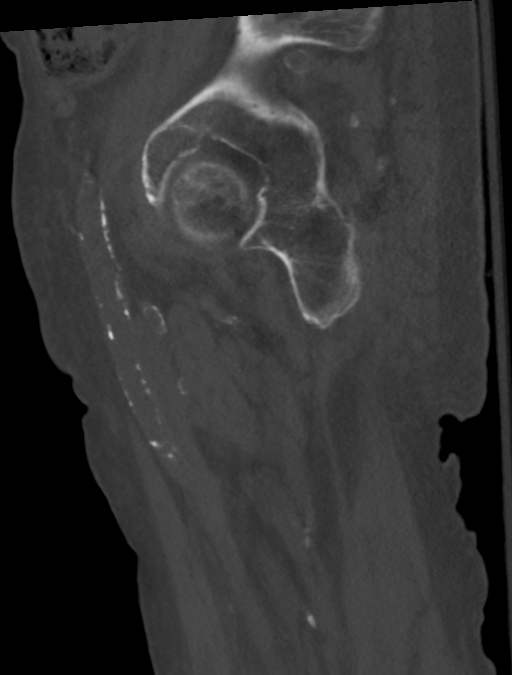

[13 of 33 positions shown; findings below may reference images not displayed]

FINDINGS: Bones/Joint/Cartilage

Diffuse osteopenia is noted. No acute fracture is identified. No
dislocation is seen. The superior pubic ramus on the right appears
intact. The area of abnormality seen on recent plain film is not
IBRAHIMU out this exam.

Ligaments

Suboptimally assessed by CT.

Muscles and Tendons

Surrounding musculature appears within normal limits with the
exception of some fatty replacement of the right gluteus minimus.

Soft tissues

No evidence of joint effusion is seen. No soft tissue abnormality is
noted. Diffuse vascular calcifications are seen.
IMPRESSION: No acute fracture is identified. The area of abnormality in the
superior pubic ramus on the right is not IBRAHIMU out on this exam.

Mild muscular atrophy with fatty replacement involving the gluteus
minimus on the right.

## 2022-05-01 IMAGING — DX DG HIP (WITH OR WITHOUT PELVIS) 2-3V*R*
3 series · 3 of 3 positions shown · non-contrast
Comparison: [DATE] and [DATE].

CLINICAL DATA: A [AGE] male presents with concern for hip
fracture.

EXAM:
DG HIP (WITH OR WITHOUT PELVIS) 2-3V RIGHT

[hip ap]
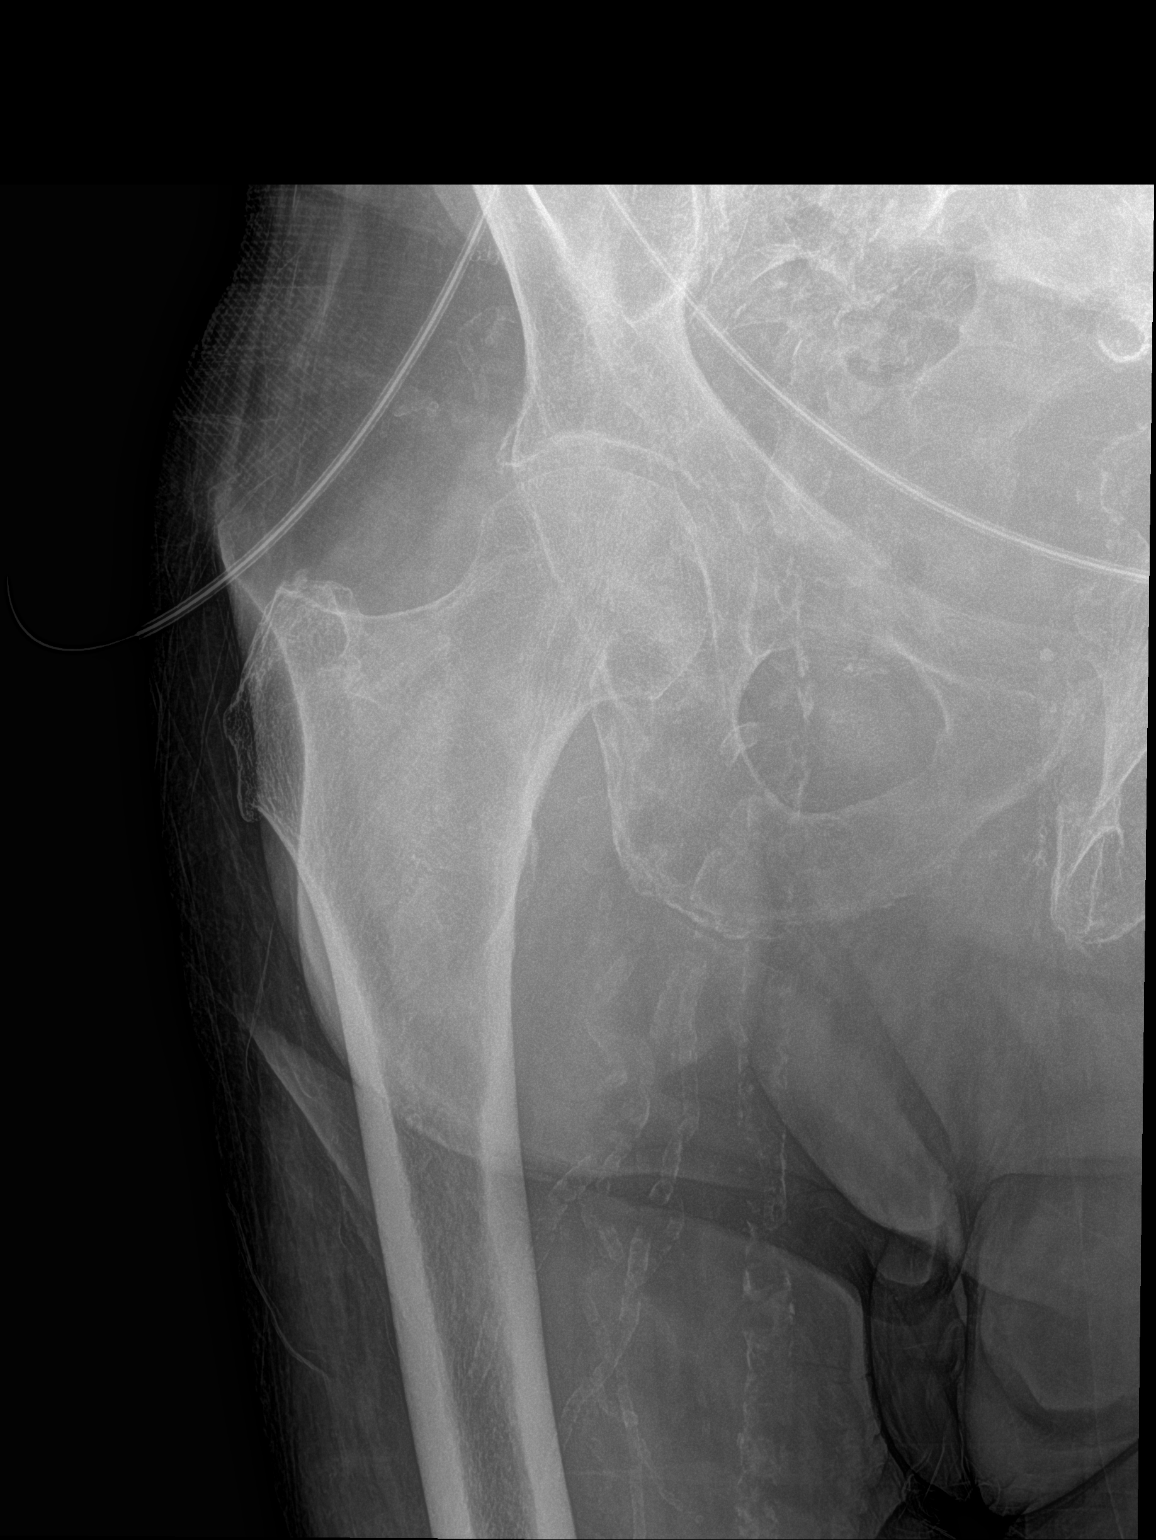

[hip lat]
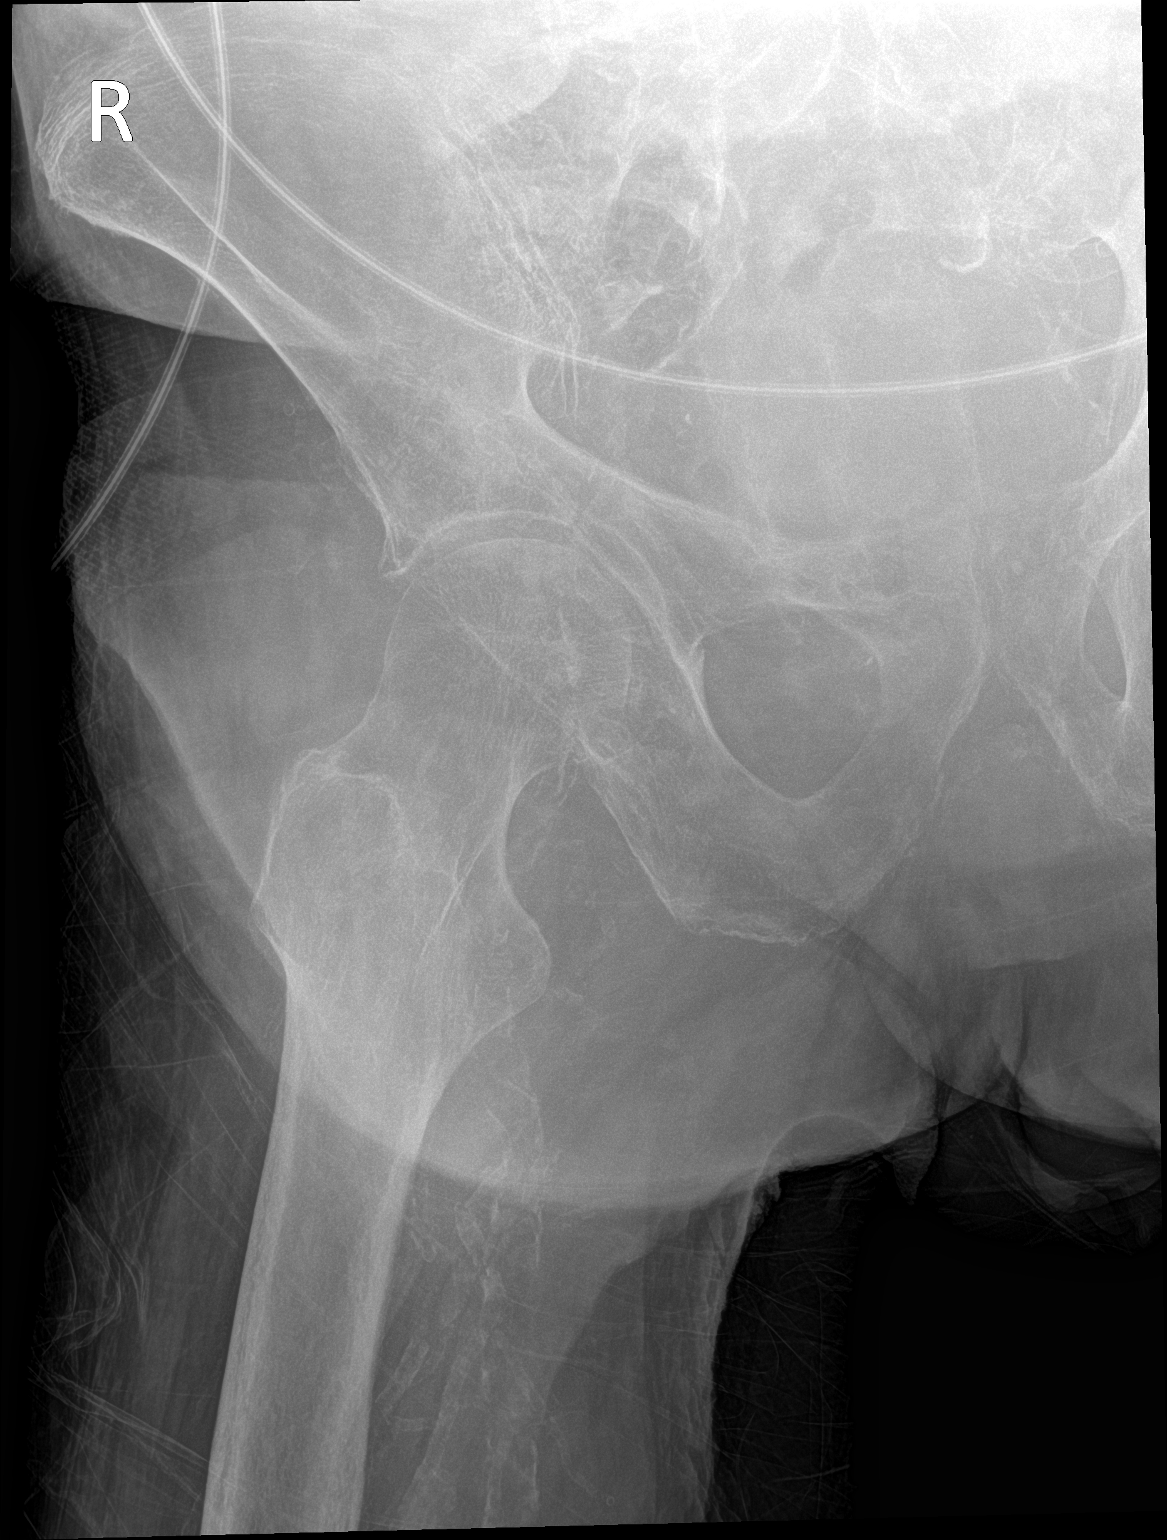

[pelvis ap]
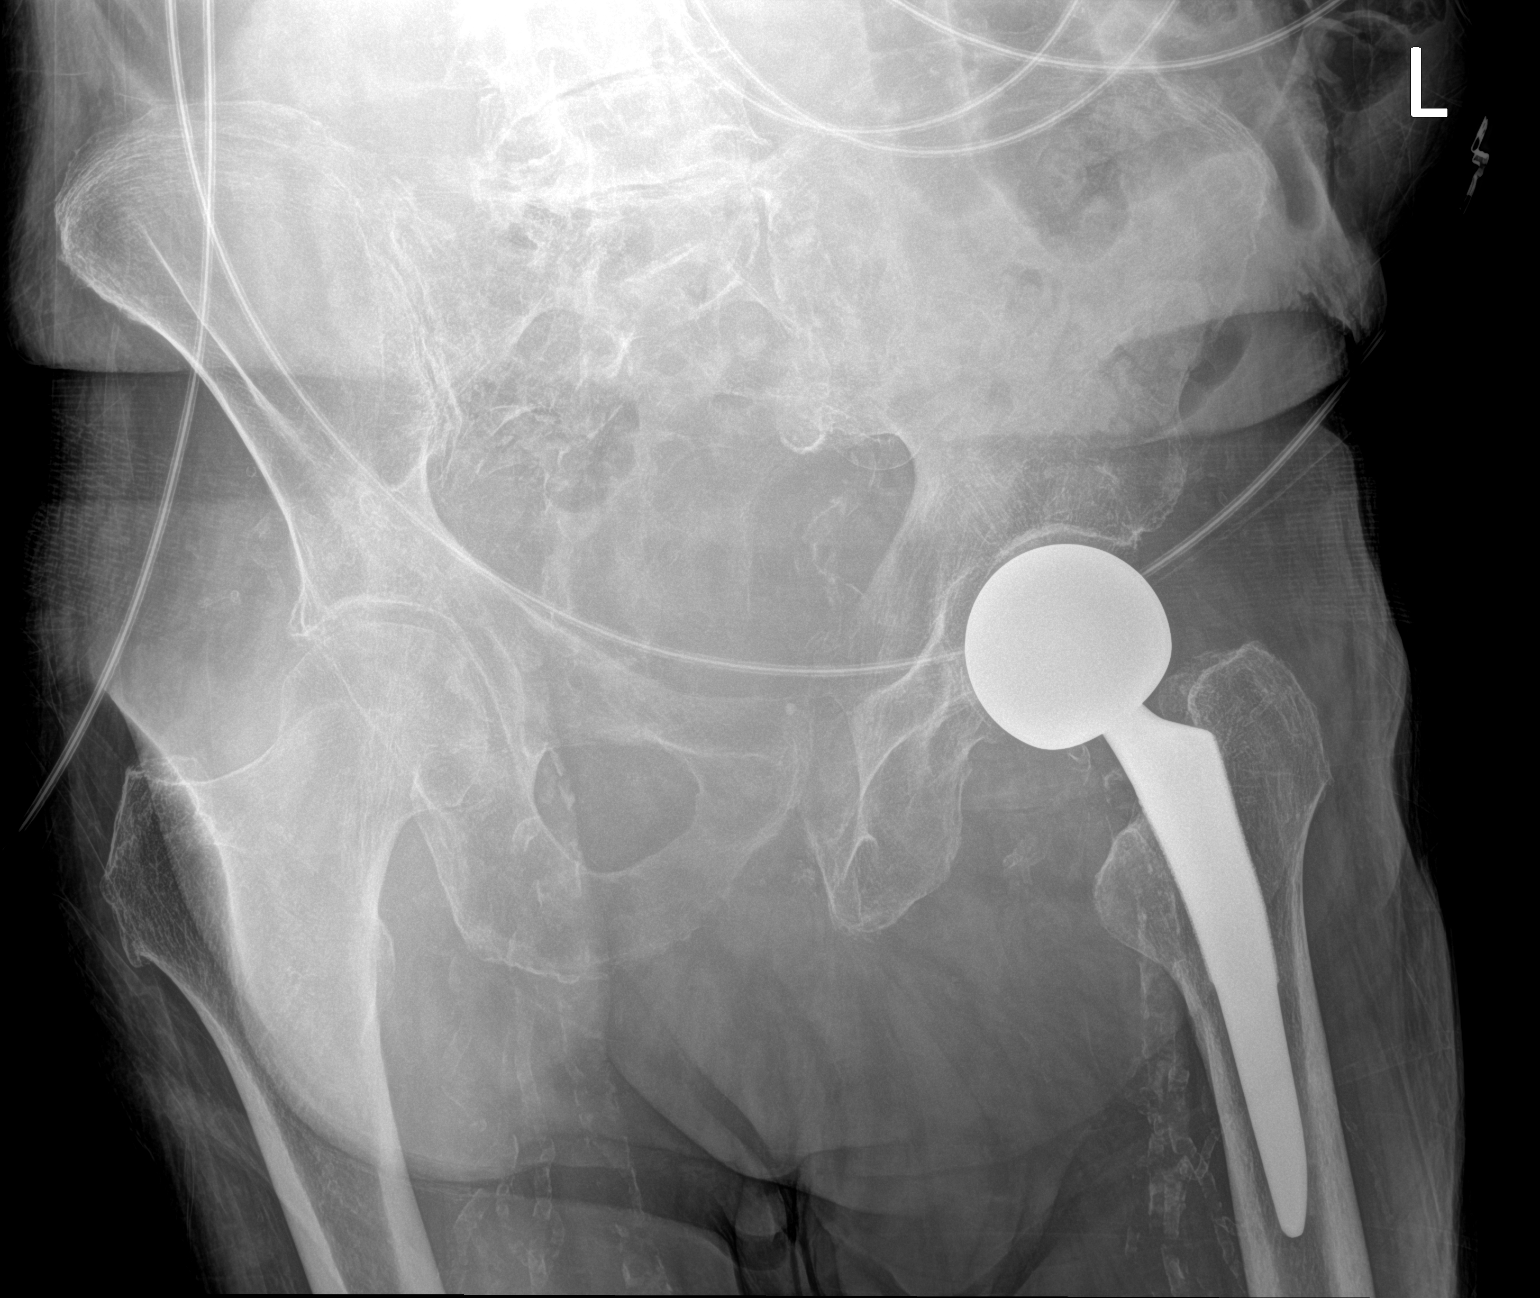

[3 of 3 positions shown; findings below may reference images not displayed]

FINDINGS: Osteopenia.

RIGHT hip is located.  No signs of RIGHT hip fracture.

Mild irregularity of the superior pubic ramus on the RIGHT. No
definite irregularity of the inferior pubic ramus.

Signs of LEFT total hip arthroplasty.

Stool and gas overlies the iliac crest bilaterally. Slightly
nonstandard positioning with rotation of the pelvis does limit
assessment of the RIGHT iliac.
IMPRESSION: 1. Mild irregularity of the superior pubic ramus on the RIGHT. No
definite irregularity of the inferior pubic ramus. Difficult to
exclude a subtle nondisplaced fracture or developing insufficiency
changes.
2. Osteopenia.
3. Signs of LEFT total hip arthroplasty.

## 2022-05-01 IMAGING — CT CT L SPINE W/O CM
3 of 4 series · 9 of 33 positions shown, 11 images · non-contrast
Comparison: Pelvis and right hip radiographs [DATE]

CLINICAL DATA: Lower back pain. Cauda equina syndrome suspected.
Right hip/lower back pain with right leg weakness. Unable to walk
with a walker today.



[Series 8: coronal bone · coronal · 0.38mm/px · 3 of 66 slices shown]
[im 14/66  bone]
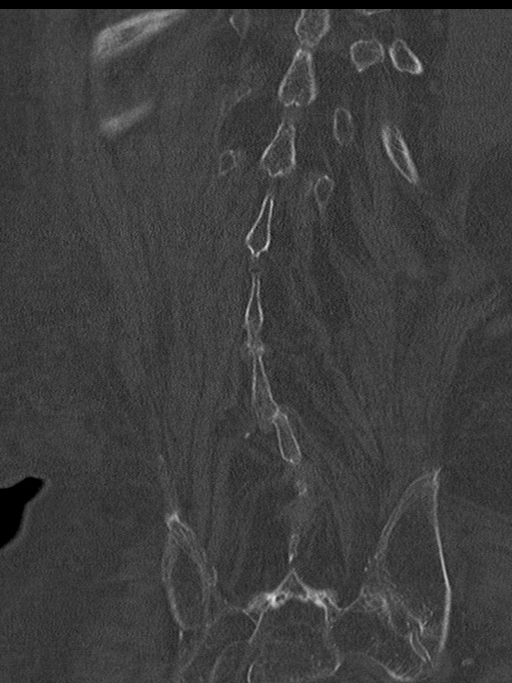
[im 27/66  bone]
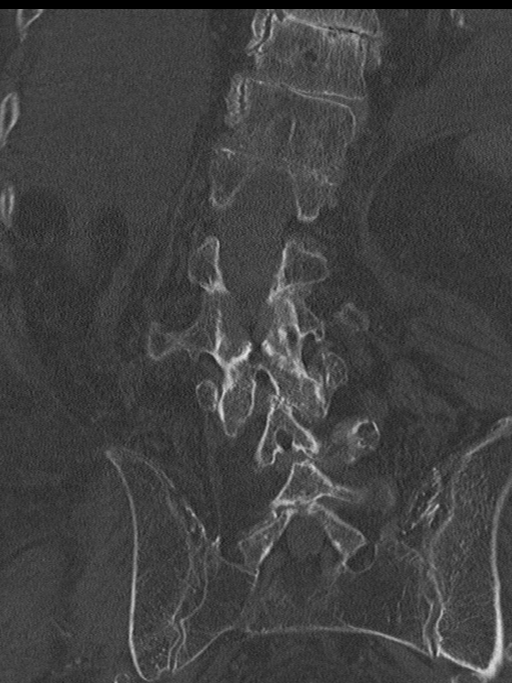
[im 40/66  bone]
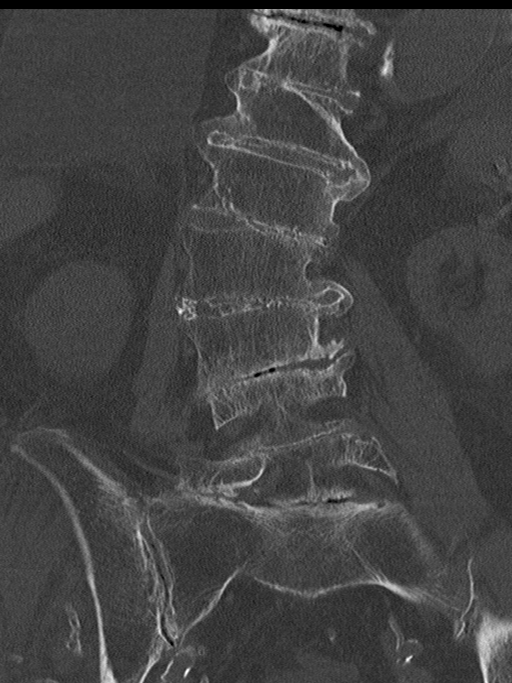

[Series 9: sagittal bone · sagittal · 0.26mm/px · 5 of 98 slices shown, 6 images]
[im 33/98  bone]
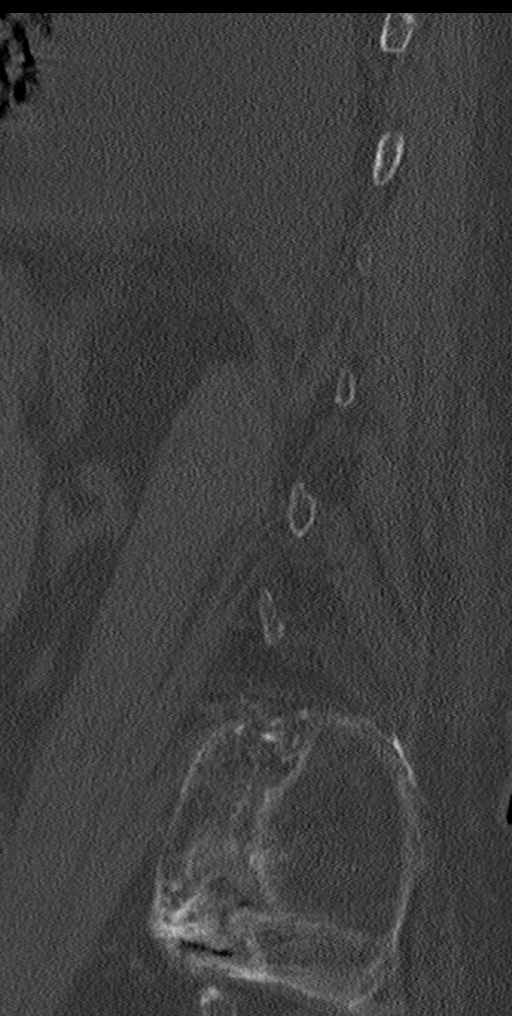
[im 41/98  bone]
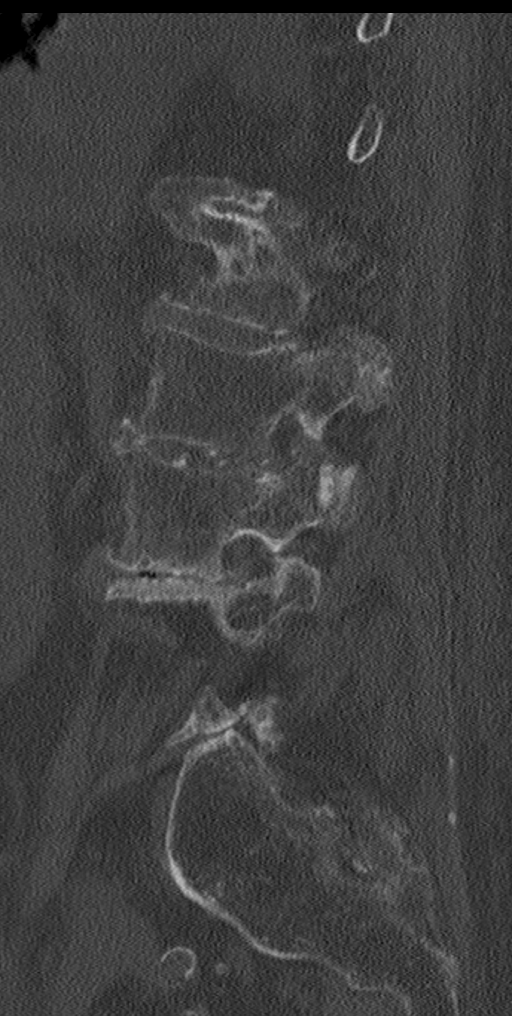
[im 49/98  soft-tissue]
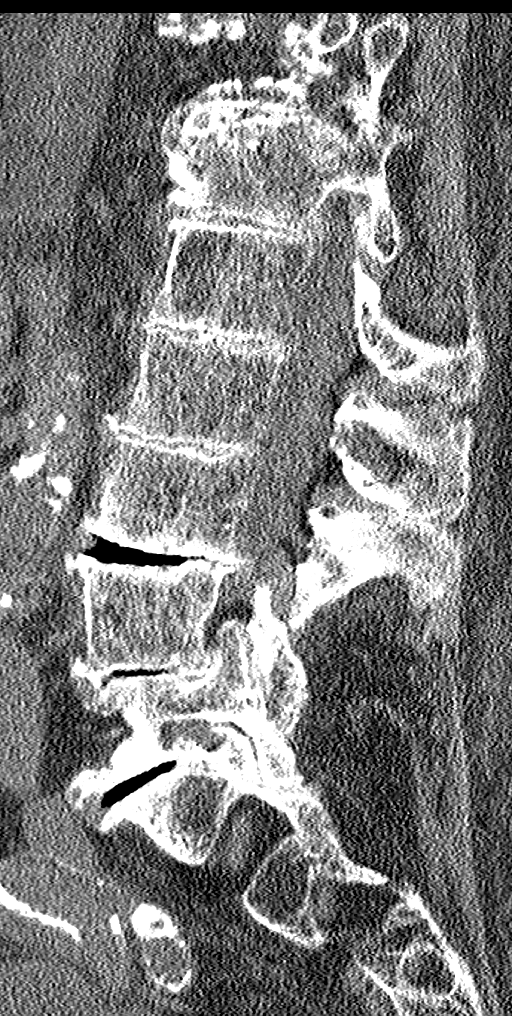
[im 49/98  bone]
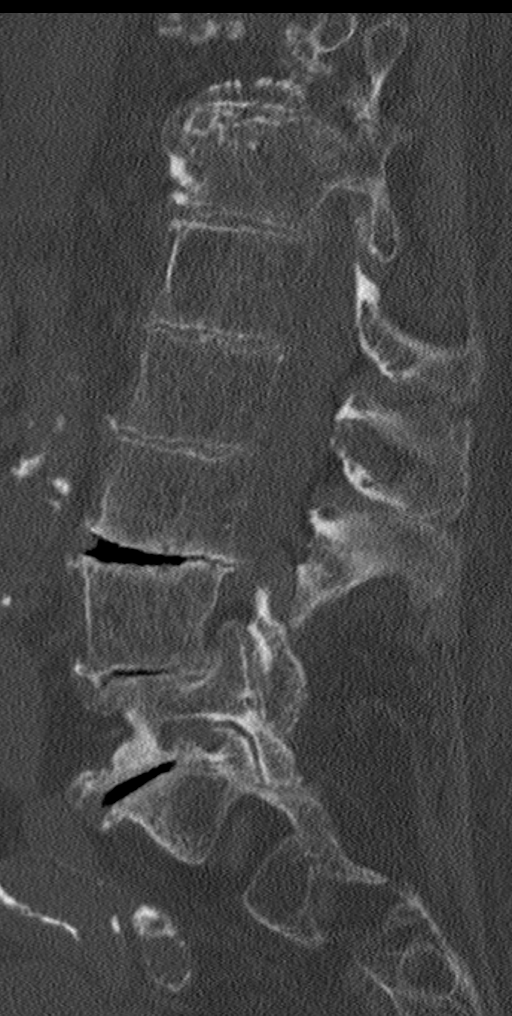
[im 57/98  bone]
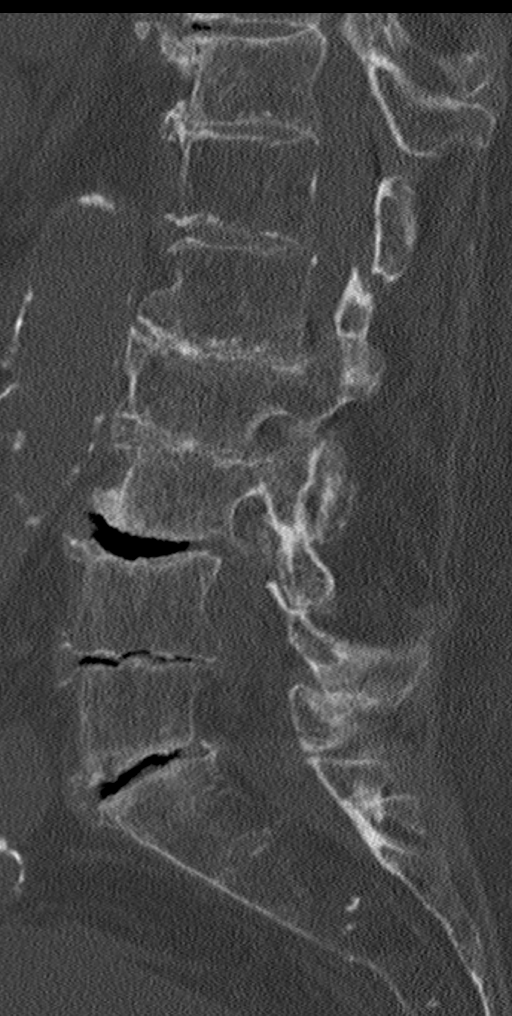
[im 65/98  bone]
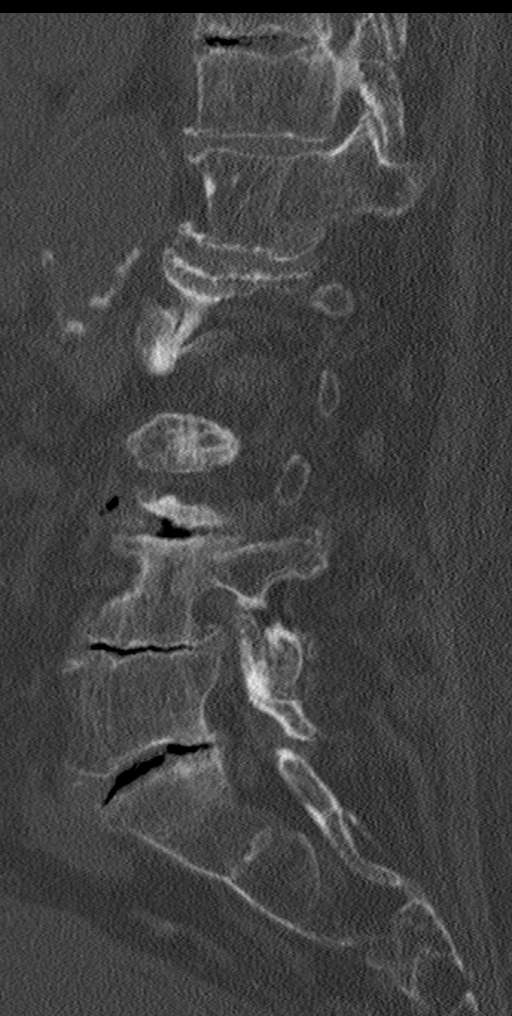

[Series 11: ax disc · axial · 0.21mm/px · z∈[+1159,+1159]mm · 1 of 84 slices shown, 2 images]
[im 50/84  soft-tissue]
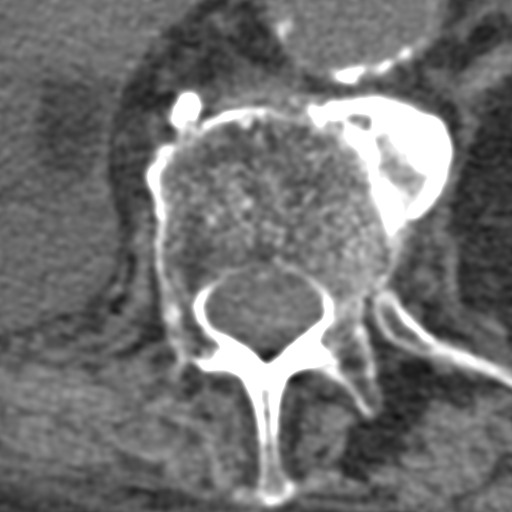
[im 50/84  bone]
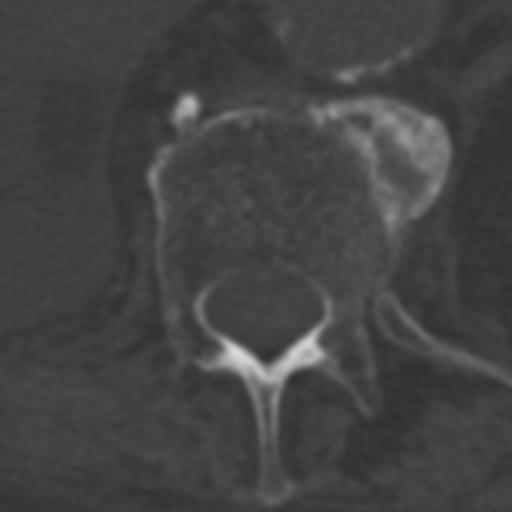

[9 of 33 positions shown; findings below may reference images not displayed]

FINDINGS: Segmentation: Standard.

Alignment: There is moderate dextrocurvature centered at L2. Mild 2
mm retrolisthesis of L4 on L5.

Vertebrae: Vertebral body heights are maintained. Severe multilevel
disc space narrowing, greatest within the anterior right T12-L1 left
L1-2, mid to left L2-3, right L3-4, diffuse L4-5, and right L5-S1
levels. Degenerative vacuum phenomenon at the T10-11 and L3-4
through L5-S1 levels.

Paraspinal and other soft tissues: The lung bases are minimally
partially visualized. There appear to be right-greater-than-left
pleural effusions. Moderate to high-grade atherosclerotic
calcifications within the partially visualized aorta and iliac
arteries.

Disc levels:

T11-12: Moderate right-greater-than-left facet joint hypertrophy.
Scoliotic curvature. Moderate left osseous neuroforaminal stenosis.
Moderate narrowing of the lateral recesses and borderline mild
central canal stenosis.

T12-L1: Moderate bilateral facet joint hypertrophy. Mild left
osseous neuroforaminal stenosis.

L1-2: Moderate bilateral facet joint hypertrophy. Mild posterior
endplate ridging. Mild left neuroforaminal stenosis. Borderline mild
central canal stenosis.

L2-3: Moderate bilateral facet joint hypertrophy.
Left-greater-than-right posterior disc osteophyte complex. Moderate
left neuroforaminal stenosis. Moderate narrowing of the lateral
recesses and mild-to-moderate central canal stenosis.

L3-4: Moderate to severe bilateral facet joint hypertrophy. Moderate
broad-based posterior disc osteophyte complex with bilateral
intraforaminal extension. Moderate left and mild-to-moderate right
neuroforaminal stenosis. Severe right lateral recess stenosis.
Likely moderate central canal stenosis.

L4-5: Moderate bilateral facet joint hypertrophy. Mild-to-moderate
broad-based posterior disc osteophyte complex. Mild
right-greater-than-left neuroforaminal stenosis. Moderate narrowing
of the lateral recesses and moderate central canal stenosis.

L5-S1: Moderate right and mild left facet joint hypertrophy.
Mild-to-moderate broad-based posterior disc osteophyte complex with
right intraforaminal extension. Mild-to-moderate right
neuroforaminal stenosis. Mild narrowing of lateral recesses. No
significant central canal stenosis.
IMPRESSION: 1. Moderate to severe multilevel degenerative disc and joint changes
as above.
2. Multilevel neuroforaminal stenosis including moderate left L2-3,
moderate left and mild-to-moderate right L3-4, mild
right-greater-than-left L4-5, and mild-to-moderate right L5-S1
osseous neuroforaminal stenosis.
3. Multilevel central canal stenosis as above mild-to-moderate at
L2-3 and moderate L3-4 and L4-5.

## 2022-05-01 NOTE — ED Notes (Signed)
Pt states no recent falls. Last few days of increase leg weakness and swelling to the knees. Increase of movement prior 2 weeks to help wife. 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

## 2022-05-01 NOTE — ED Provider Notes (Signed)
[facility] EMERGENCY DEPT Provider Note   CSN: [id number] Arrival date & time: 05/01/22  1401     History  Chief Complaint  Patient presents with   Weakness    [name] is a 88 y.o. male.  ThePt is a 88y/o male with hx of afib on eliquis, RA, GERD, HTN who is presenting today with family due to inability to walk due to severe right hip pain.  Provides history that patient's wife who is also 32 has been dealing with severe back pain and vertebral compression fractures over the last few weeks and he has been caring for her.  He reports that in the last 2 to 3 days he started having pain in his right hip that has now become so severe he is unable to walk.  Normally he gets around with a walker but reports now anytime he puts weight on his right leg it is severely painful and he cannot bear weight.  He has been trying to put more weight on his left leg but reports now it is starting to buckle when he tries to walk.  His son who is present in the room reports when they went over to see him today he was not even able to ambulate down the hallway even though they were able to get a doctor's appointment for him he could not even make it out of the house so they called the ambulance.  Patient has had chronic swelling in his lower extremities but does not feel like it is any worse.  He reports he has not had any falls and denies any chest pain, shortness of breath, abdominal pain, nausea or vomiting.  He denies any back pain.  He does report pulling a muscle in his chest several days ago and has noted significant bruising and swelling over his left pectoral region but again denies any falls.  He has been taking 650 mg of Tylenol every 6 hours but it is not helping.  The history is provided by the patient and a relative.  Weakness      Home Medications Prior to Admission medications   Medication Sig Start Date End Date Taking? Authorizing Provider  Besifloxacin HCl 0.6 % SUSP  Besivance 0.6 % eye drops,suspension    [provider]  COMBIGAN 0.2-0.5 % ophthalmic solution Place 1 drop into the right eye 2 (two) times daily. 12/15/19   [provider]  diltiazem (TIAZAC) 360 MG 24 hr capsule Take 360 mg by mouth daily. 02/20/15   [provider]  docusate sodium (COLACE) 100 MG capsule Take 1 capsule (100 mg total) 2 (two) times daily by mouth. 10/01/17   Danae Orleans, PA-C  ferrous sulfate 325 (65 FE) MG tablet Take 1 tablet (325 mg total) 3 (three) times daily after meals by mouth. 10/01/17   Danae Orleans, PA-C  metoprolol succinate (TOPROL-XL) 50 MG 24 hr tablet Take 50 mg by mouth daily.  04/08/15   [provider]  moxifloxacin (VIGAMOX) 0.5 % ophthalmic solution Apply to eye. 01/26/21   [provider]  Multiple Vitamins-Minerals (PRESERVISION AREDS 2) CAPS Take 1 capsule by mouth 2 (two) times daily.    [provider]  Omega-3 Fatty Acids (FISH OIL) 1000 MG CAPS Take 1 capsule (1,000 mg total) by mouth 2 (two) times daily. 10/04/17   Patrecia Pour, MD  pantoprazole (PROTONIX) 40 MG tablet Take 1 tablet (40 mg total) by mouth 2 (two) times daily. 10/04/17   Vance Gather  B, MD  ROCKLATAN 0.02-0.005 % SOLN SMARTSIG:1 Drop(s) Right Eye Every Evening 05/17/20   [provider]  timolol (TIMOPTIC) 0.25 % ophthalmic solution 1 drop into affected eye    [provider]  timolol (TIMOPTIC) 0.5 % ophthalmic solution Place 1 drop into the right eye every morning. 12/05/20   [provider]  warfarin (COUMADIN) 2 MG tablet TAKE 1 TABLET BY MOUTH EVERY DAY EXCEPT ON WEDNESDAY TAKE 2 TABS 09/22/17   [provider]      Allergies    Shellfish allergy, Sulfa antibiotics, and Fish allergy    Review of Systems   Review of Systems  Neurological:  Positive for weakness.    Physical Exam Updated Vital Signs BP 128/65   Pulse 62   Temp 98 F (36.7 C) (Oral)   Resp 15   SpO2 100%   Physical Exam Vitals and nursing note reviewed.  Constitutional:      General: He is not in acute distress.    Appearance: He is well-developed.  HENT:     Head: Normocephalic and atraumatic.  Eyes:     Conjunctiva/sclera: Conjunctivae normal.     Pupils: Pupils are equal, round, and reactive to light.  Cardiovascular:     Rate and Rhythm: Normal rate. Rhythm irregular.     Heart sounds: No murmur heard. Pulmonary:     Effort: Pulmonary effort is normal. No respiratory distress.     Breath sounds: Normal breath sounds. No wheezing or rales.  Chest:    Abdominal:     General: There is no distension.     Palpations: Abdomen is soft.     Tenderness: There is no abdominal tenderness. There is no guarding or rebound.  Musculoskeletal:        General: Tenderness present. Normal range of motion.     Cervical back: Normal range of motion and neck supple.     Right lower leg: Edema present.     Left lower leg: Edema present.     Comments: 2+ pitting edema bilaterally in the lower extremities.  No pain with axial loading on the right lower extremity but pain when trying to range the right hip.  Pain in the hip as well as in the hamstring region.  No medial thigh tenderness.  Right knee is within normal limits.  No palpable back tenderness and normal flexion extension of the back.  Skin:    General: Skin is warm and dry.     Findings: No erythema or rash.  Neurological:     Mental Status: He is alert and oriented to person, place, and time.     Comments: 4 out of 5 strength in the left lower extremity and he can lift it spontaneously off of the bed and hold it without pronator drift although does appear to be difficult for him to do.  However he is unable to lift the right leg off the bed at all.  When lifting it for him he cannot hold it up.  Psychiatric:        Mood and Affect: Mood normal.        Behavior: Behavior normal.     ED Results / Procedures / Treatments   Labs (all labs  ordered are listed, but only abnormal results are displayed) Labs Reviewed  CBC - Abnormal; Notable for the following components:      Result Value   RBC 2.87 (*)    Hemoglobin 9.2 (*)    HCT 28.5 (*)  RDW 15.6 (*)    All other components within normal limits  CBG MONITORING, ED - Abnormal; Notable for the following components:   Glucose-Capillary 110 (*)    All other components within normal limits  BASIC METABOLIC PANEL  URINALYSIS, ROUTINE W REFLEX MICROSCOPIC  PROTIME-INR    EKG EKG Interpretation  Date/Time:  Tuesday May 01 2022 14:12:54 EDT Ventricular Rate:  63 PR Interval:  136 QRS Duration: 95 QT Interval:  613 QTC Calculation: 628 R Axis:   -4 Text Interpretation: Atrial fibrillation Probable anterior infarct, age indeterminate new Prolonged QT interval Confirmed by Blanchie Dessert [id number]) on 05/01/2022 2:34:04 PM  Radiology DG Hip Unilat W or Wo Pelvis 2-3 Views Right  Result Date: 05/01/2022 CLINICAL DATA:  A 88 year old male presents with concern for hip fracture. EXAM: DG HIP (WITH OR WITHOUT PELVIS) 2-3V RIGHT COMPARISON:  March 11, 2015 and November of 2018. FINDINGS: Osteopenia. RIGHT hip is located.  No signs of RIGHT hip fracture. Mild irregularity of the superior pubic ramus on the RIGHT. No definite irregularity of the inferior pubic ramus. Signs of LEFT total hip arthroplasty. Stool and gas overlies the iliac crest bilaterally. Slightly nonstandard positioning with rotation of the pelvis does limit assessment of the RIGHT iliac. IMPRESSION: 1. Mild irregularity of the superior pubic ramus on the RIGHT. No definite irregularity of the inferior pubic ramus. Difficult to exclude a subtle nondisplaced fracture or developing insufficiency changes. 2. Osteopenia. 3. Signs of LEFT total hip arthroplasty. Electronically Signed   By: Zetta Bills M.D.   On: 05/01/2022 14:58    Procedures Procedures    Medications Ordered in ED Medications - No data to  display  ED Course/ Medical Decision Making/ A&P                           Medical Decision Making Amount and/or Complexity of Data Reviewed Labs: ordered. Radiology: ordered and independent interpretation performed. Decision-making details documented in ED Course.   Pt with multiple medical problems and comorbidities and presenting today with a complaint that caries a high risk for morbidity and mortality.  Presenting today with right hip pain and inability to walk.  Patient normally lives independently at his home and uses a walker but was not able to do that today.  He has been caring for his elderly wife who has been having issues with her back.  He denies any falls but has areas of bruising over his chest which he reports is from lifting his wife.  Concern for possible occult fracture versus radicular pain.  Low suspicion for vertebral fracture as patient has no back pain.  Low suspicion for acute urinary or abdominal process as he has no complaints consistent with that.  Patient does have bilateral lower extremity edema but low suspicion for DVT as patient is already anticoagulated.  We will start with plain imaging of the hip to evaluate for fracture.  However if x-rays are negative feel that patient will need a CT or an MRI both which are unavailable at this facility.  Patient is not having any pain while at rest.  3:18 PM I have independently visualized and interpreted pt's images today. Without obvious fracture however radiology reports there is some irregularity of the superior pubic ramus on the right but no definitive irregularity in the inferior pelvis.  Could be a nondisplaced fracture given patient's new symptoms concerned that might be the case however she did he does have  some weakness in his leg as well.  This all could be related to pain but most likely will need an MRI of his lumbar spine and hip to confirm no radicular findings and if this is a true fracture.  Patient checked out  to Dr. Gustavus Messing at 3:30.         Final Clinical Impression(s) / ED Diagnoses Final diagnoses:  None    Rx / DC Orders ED Discharge Orders     None         Blanchie Dessert, MD 05/01/22 1520

## 2022-05-01 NOTE — ED Notes (Signed)
Pt currently unable to provide urine sample. Gave pt a urinal and instructed to press call button when he feels he is able to go.

## 2022-05-01 NOTE — ED Provider Notes (Signed)
3:16 PM Care assumed from Dr. Maryan Rued.  At time of transfer care, he is awaiting on a metabolic panel prior to transfer for MRI of his lumbar spine and right hip/pelvis after onset of pain and weakness in his right leg.  Due to patient's inability to ambulate or raise his leg anticipate that if the imaging does not show acute injury or abnormality, he will likely need PT to eval to determine disposition from the emergency department.  5:44 PM Labs returned without any significant abnormalities at this time.  Due to the persistent weakness in the right leg which is new and the discomfort in the right pelvis/hip area, patient will need ED to ED transfer for MRI of the lumbar spine and right hip/pelvis as well as likely physical therapy evaluation if there is not something to have to admit him.  He lives along with his wife who is now nonambulatory and he cannot bear weight or walk.  5:49 PM Just spoke to Dr. Darl Householder at [facility] who accept the patient for ED to ED transfer if MRI is needed.  After discussion now that we have the CT scanner back functioning, he recommended getting a CT of the lumbar spine and right hip/pelvis and if it shows a fracture, he would likely be a better candidate for direct admission to get MRI and physical therapy otherwise instead of doing ED to ED transfer.  If CTs do not show acute injuries, we will do the ED to ED transfer for MRI and PT eval.   9:26 PM CTs did not show any evidence of acute fracture.  Given the patient's inability to walk with the continued pain in the right hip and the weakness in the leg, will go with the previous plan and transfer ED to ED for MRI lumbar spine and right hip.  Anticipate physical therapy evaluation after imaging is completed if there are no fractures requiring admission.  Dr. Darl Householder accepted the patient in transfer if CT did not show a reason to have to admit..  We will transfer for MRIs and PT/case management eval. nursing updated the  family with this plan.  Clinical Impression: 1. Right leg weakness   2. Right hip pain     Disposition: Transfer ED to ED for MRI and reevaluation  This note was prepared with assistance of Dragon voice recognition software. Occasional wrong-word or sound-a-like substitutions may have occurred due to the inherent limitations of voice recognition software.           Mansi Tokar, Gwenyth Allegra, MD 05/01/22 2211

## 2022-05-01 NOTE — ED Notes (Signed)
Called and updated family.  

## 2022-05-01 NOTE — ED Notes (Signed)
[facility] arrived to transport pt. Pt stable at time of departure ?

## 2022-05-01 NOTE — ED Notes (Signed)
Patient arrived to [name] from [facility] ED.  No complaints voiced at this time.  Requires MRI due to BLE weakness and pain.  Unable to ambulate

## 2022-05-01 NOTE — ED Triage Notes (Signed)
Patient reports to the ER by EMS for weakness in his lower extremities. Patient normally walks with a walker and today could not walk. Patient reports hx of afib and HTN. Patient has swelling to his extremities that he reports is new

## 2022-05-01 NOTE — ED Provider Notes (Signed)
  11:10 PM Arrives from [facility] for MRI lumbar spine and right hip.  Denies complaints while lying still, no pain unless moved.  He denies claustrophobia, thinks he can handle MRI fine without medication.  Awaiting MRI's at this time.  2:15 AM Call from radiology, does appear to have acetabular fracture, femur looks ok.  Some muscular edema which is likely related.  MRI lumbar spine being read separately.  MRI L-spine with degenerative changes but no acute findings.  Suspect difficulty walking due to acetabular fracture.  I discussed results with patient, he does not really want any surgical intervention as his wife is having a kyphoplasty on Thursday and he needs to be able to care for her.  Discussed with on-call orthopedics, Dr. Onnie Graham-- typically non-operative management.  Usually NWB and progress to walker.  They will evaluate in the AM and provide full consultation.  Admit to medicine service for now.  Will need PT/OT eval.  Discussed with Dr. Nevada Crane-- will admit for ongoing care.    3:48 AM INR has just come back and is supratherapeutic at 4.1.  Dr. Nevada Crane updated via secure chat message.    Larene Pickett, PA-C 05/02/22 1696    Quintella Reichert, MD 05/02/22 [phone]

## 2022-05-02 ENCOUNTER — Encounter (HOSPITAL_COMMUNITY): Payer: Self-pay | Admitting: Internal Medicine

## 2022-05-02 ENCOUNTER — Emergency Department (HOSPITAL_COMMUNITY): Payer: Medicare Other

## 2022-05-02 DIAGNOSIS — R2689 Other abnormalities of gait and mobility: Secondary | ICD-10-CM | POA: Diagnosis not present

## 2022-05-02 DIAGNOSIS — M069 Rheumatoid arthritis, unspecified: Secondary | ICD-10-CM | POA: Diagnosis present

## 2022-05-02 DIAGNOSIS — S32401A Unspecified fracture of right acetabulum, initial encounter for closed fracture: Secondary | ICD-10-CM | POA: Diagnosis present

## 2022-05-02 DIAGNOSIS — I129 Hypertensive chronic kidney disease with stage 1 through stage 4 chronic kidney disease, or unspecified chronic kidney disease: Secondary | ICD-10-CM | POA: Diagnosis present

## 2022-05-02 DIAGNOSIS — R41841 Cognitive communication deficit: Secondary | ICD-10-CM | POA: Diagnosis not present

## 2022-05-02 DIAGNOSIS — R531 Weakness: Secondary | ICD-10-CM | POA: Diagnosis not present

## 2022-05-02 DIAGNOSIS — Z7401 Bed confinement status: Secondary | ICD-10-CM | POA: Diagnosis not present

## 2022-05-02 DIAGNOSIS — F17290 Nicotine dependence, other tobacco product, uncomplicated: Secondary | ICD-10-CM | POA: Diagnosis present

## 2022-05-02 DIAGNOSIS — Y93F2 Activity, caregiving, lifting: Secondary | ICD-10-CM | POA: Diagnosis not present

## 2022-05-02 DIAGNOSIS — Z882 Allergy status to sulfonamides status: Secondary | ICD-10-CM | POA: Diagnosis not present

## 2022-05-02 DIAGNOSIS — S32491D Other specified fracture of right acetabulum, subsequent encounter for fracture with routine healing: Secondary | ICD-10-CM | POA: Diagnosis not present

## 2022-05-02 DIAGNOSIS — R600 Localized edema: Secondary | ICD-10-CM | POA: Diagnosis present

## 2022-05-02 DIAGNOSIS — Z823 Family history of stroke: Secondary | ICD-10-CM | POA: Diagnosis not present

## 2022-05-02 DIAGNOSIS — S20212A Contusion of left front wall of thorax, initial encounter: Secondary | ICD-10-CM | POA: Diagnosis present

## 2022-05-02 DIAGNOSIS — K219 Gastro-esophageal reflux disease without esophagitis: Secondary | ICD-10-CM | POA: Diagnosis present

## 2022-05-02 DIAGNOSIS — I4821 Permanent atrial fibrillation: Secondary | ICD-10-CM | POA: Diagnosis present

## 2022-05-02 DIAGNOSIS — S32409A Unspecified fracture of unspecified acetabulum, initial encounter for closed fracture: Secondary | ICD-10-CM | POA: Diagnosis present

## 2022-05-02 DIAGNOSIS — Z7901 Long term (current) use of anticoagulants: Secondary | ICD-10-CM | POA: Diagnosis not present

## 2022-05-02 DIAGNOSIS — R1312 Dysphagia, oropharyngeal phase: Secondary | ICD-10-CM | POA: Diagnosis not present

## 2022-05-02 DIAGNOSIS — Z9181 History of falling: Secondary | ICD-10-CM | POA: Diagnosis not present

## 2022-05-02 DIAGNOSIS — Z66 Do not resuscitate: Secondary | ICD-10-CM | POA: Diagnosis not present

## 2022-05-02 DIAGNOSIS — D649 Anemia, unspecified: Secondary | ICD-10-CM | POA: Diagnosis not present

## 2022-05-02 DIAGNOSIS — I482 Chronic atrial fibrillation, unspecified: Secondary | ICD-10-CM | POA: Diagnosis not present

## 2022-05-02 DIAGNOSIS — M6281 Muscle weakness (generalized): Secondary | ICD-10-CM | POA: Diagnosis not present

## 2022-05-02 DIAGNOSIS — N179 Acute kidney failure, unspecified: Secondary | ICD-10-CM | POA: Diagnosis present

## 2022-05-02 DIAGNOSIS — R791 Abnormal coagulation profile: Secondary | ICD-10-CM | POA: Diagnosis present

## 2022-05-02 DIAGNOSIS — Z96642 Presence of left artificial hip joint: Secondary | ICD-10-CM | POA: Diagnosis present

## 2022-05-02 DIAGNOSIS — S32401K Unspecified fracture of right acetabulum, subsequent encounter for fracture with nonunion: Secondary | ICD-10-CM

## 2022-05-02 DIAGNOSIS — Z79899 Other long term (current) drug therapy: Secondary | ICD-10-CM | POA: Diagnosis not present

## 2022-05-02 DIAGNOSIS — R262 Difficulty in walking, not elsewhere classified: Secondary | ICD-10-CM | POA: Diagnosis not present

## 2022-05-02 DIAGNOSIS — D631 Anemia in chronic kidney disease: Secondary | ICD-10-CM | POA: Diagnosis present

## 2022-05-02 DIAGNOSIS — M25551 Pain in right hip: Secondary | ICD-10-CM | POA: Diagnosis present

## 2022-05-02 DIAGNOSIS — D539 Nutritional anemia, unspecified: Secondary | ICD-10-CM | POA: Diagnosis present

## 2022-05-02 DIAGNOSIS — N1831 Chronic kidney disease, stage 3a: Secondary | ICD-10-CM | POA: Diagnosis present

## 2022-05-02 DIAGNOSIS — X500XXA Overexertion from strenuous movement or load, initial encounter: Secondary | ICD-10-CM | POA: Diagnosis not present

## 2022-05-02 DIAGNOSIS — M4316 Spondylolisthesis, lumbar region: Secondary | ICD-10-CM | POA: Diagnosis not present

## 2022-05-02 LAB — COMPREHENSIVE METABOLIC PANEL
ALT: 13 U/L (ref 0–44)
AST: 19 U/L (ref 15–41)
Albumin: 2.6 g/dL — ABNORMAL LOW (ref 3.5–5.0)
Alkaline Phosphatase: 87 U/L (ref 38–126)
Anion gap: 6 (ref 5–15)
BUN: 23 mg/dL (ref 8–23)
CO2: 22 mmol/L (ref 22–32)
Calcium: 8.2 mg/dL — ABNORMAL LOW (ref 8.9–10.3)
Chloride: 110 mmol/L (ref 98–111)
Creatinine, Ser: 1.18 mg/dL (ref 0.61–1.24)
GFR, Estimated: 56 mL/min — ABNORMAL LOW (ref >60)
Glucose, Bld: 95 mg/dL (ref 70–99)
Potassium: 4.4 mmol/L (ref 3.5–5.1)
Sodium: 138 mmol/L (ref 135–145)
Total Bilirubin: 0.9 mg/dL (ref 0.3–1.2)
Total Protein: 5.6 g/dL — ABNORMAL LOW (ref 6.5–8.1)

## 2022-05-02 LAB — PROTIME-INR
INR: 4.1 (ref 0.8–1.2)
Prothrombin Time: 39.1 seconds — ABNORMAL HIGH (ref 11.4–15.2)

## 2022-05-02 LAB — CBC WITH DIFFERENTIAL/PLATELET
Abs Immature Granulocytes: 0.02 10*3/uL (ref 0.00–0.07)
Basophils Absolute: 0 10*3/uL (ref 0.0–0.1)
Basophils Relative: 0 %
Eosinophils Absolute: 0.1 10*3/uL (ref 0.0–0.5)
Eosinophils Relative: 1 %
HCT: 24.9 % — ABNORMAL LOW (ref 39.0–52.0)
Hemoglobin: 8.3 g/dL — ABNORMAL LOW (ref 13.0–17.0)
Immature Granulocytes: 0 %
Lymphocytes Relative: 6 %
Lymphs Abs: 0.4 10*3/uL — ABNORMAL LOW (ref 0.7–4.0)
MCH: 33.7 pg (ref 26.0–34.0)
MCHC: 33.3 g/dL (ref 30.0–36.0)
MCV: 101.2 fL — ABNORMAL HIGH (ref 80.0–100.0)
Monocytes Absolute: 1 10*3/uL (ref 0.1–1.0)
Monocytes Relative: 15 %
Neutro Abs: 5 10*3/uL (ref 1.7–7.7)
Neutrophils Relative %: 78 %
Platelets: 191 10*3/uL (ref 150–400)
RBC: 2.46 MIL/uL — ABNORMAL LOW (ref 4.22–5.81)
RDW: 15.7 % — ABNORMAL HIGH (ref 11.5–15.5)
WBC: 6.5 10*3/uL (ref 4.0–10.5)
nRBC: 0 % (ref 0.0–0.2)

## 2022-05-02 LAB — PHOSPHORUS: Phosphorus: 2.9 mg/dL (ref 2.5–4.6)

## 2022-05-02 LAB — URINALYSIS, ROUTINE W REFLEX MICROSCOPIC
Bilirubin Urine: NEGATIVE
Glucose, UA: NEGATIVE mg/dL
Hgb urine dipstick: NEGATIVE
Ketones, ur: NEGATIVE mg/dL
Leukocytes,Ua: NEGATIVE
Nitrite: NEGATIVE
Protein, ur: NEGATIVE mg/dL
Specific Gravity, Urine: 1.019 (ref 1.005–1.030)
pH: 6 (ref 5.0–8.0)

## 2022-05-02 LAB — MAGNESIUM: Magnesium: 2 mg/dL (ref 1.7–2.4)

## 2022-05-02 LAB — VITAMIN B12: Vitamin B-12: 386 pg/mL (ref 180–914)

## 2022-05-02 LAB — FOLATE: Folate: 21.1 ng/mL (ref >5.9)

## 2022-05-02 IMAGING — MR MR LUMBAR SPINE W/O CM
5 of 8 series · 22 of 48 positions shown · non-contrast
Comparison: No prior MRI of the lumbar spine, correlation is made
with CT lumbar spine [DATE]

CLINICAL DATA: Low back pain, cauda equina syndrome suspected

EXAM:
MRI LUMBAR SPINE WITHOUT CONTRAST
TECHNIQUE: Multiplanar, multisequence MR imaging of the lumbar spine was
performed. No intravenous contrast was administered.

[Series 5: T2 · sagittal · right · 4.0mm · 0.73mm/px · 4 of 21 slices shown (1 of 2)]
[im 1/21]
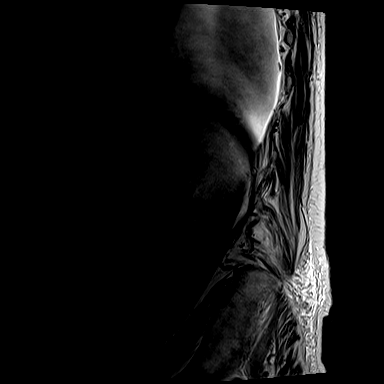
[im 7/21]
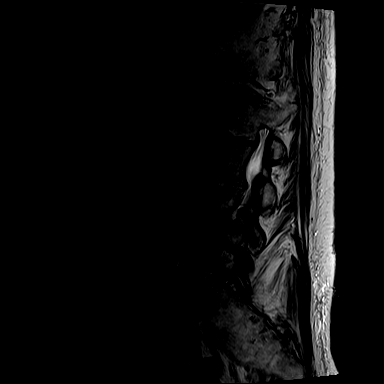
[im 14/21]
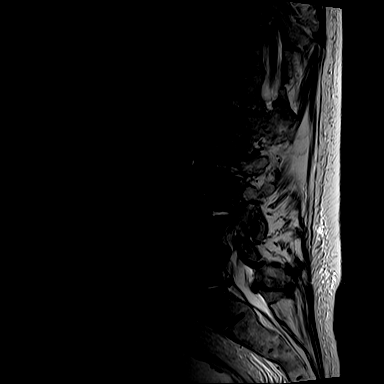
[im 21/21]
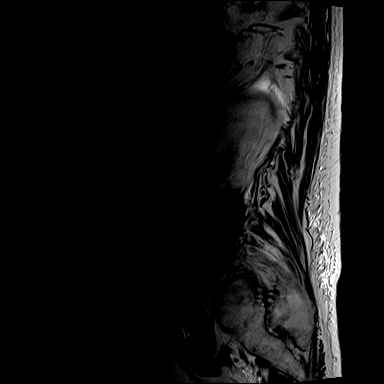

[Series 6: STIR · sagittal · right · 4.0mm · 0.55mm/px · 2 of 21 slices shown]
[im 1/21]
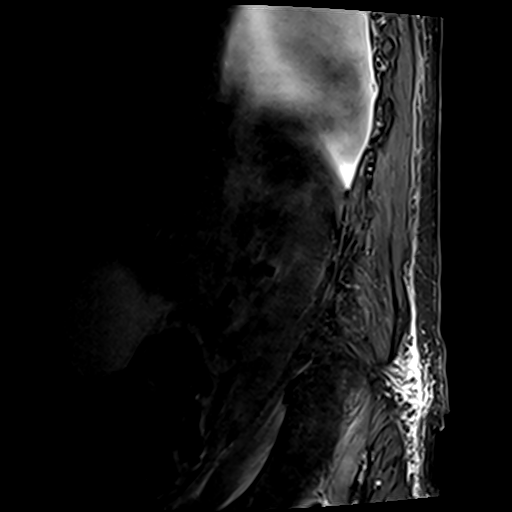
[im 7/21]
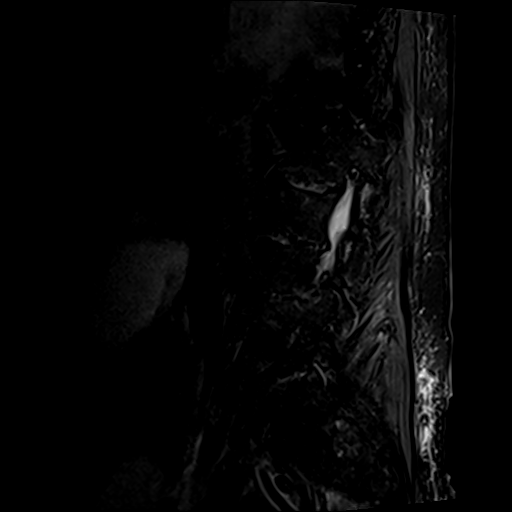

[Series 7: T1 · sagittal · right · 4.0mm · 0.88mm/px · 4 of 21 slices shown (1 of 2)]
[im 1/21]
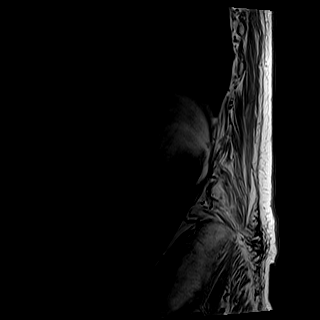
[im 7/21]
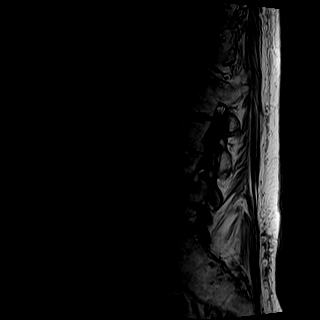
[im 14/21]
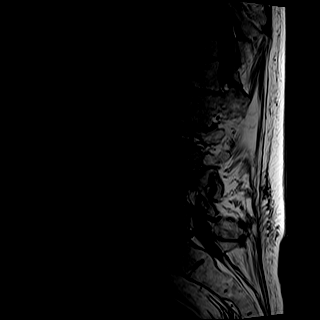
[im 21/21]
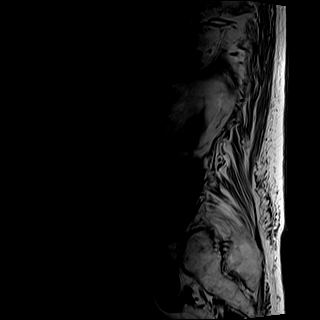

[Series 8: T2 · axial · right · 4.0mm · 0.57mm/px · z∈[-144,+58]mm · 6 of 33 slices shown (2 of 2)]
[im 1/33]
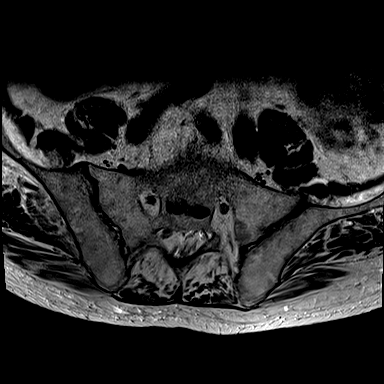
[im 7/33]
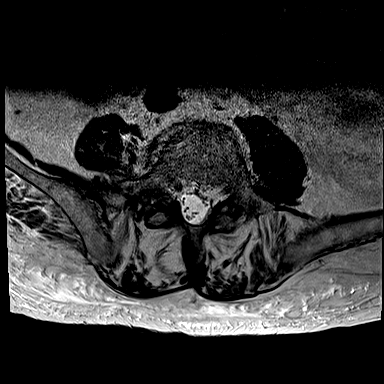
[im 13/33]
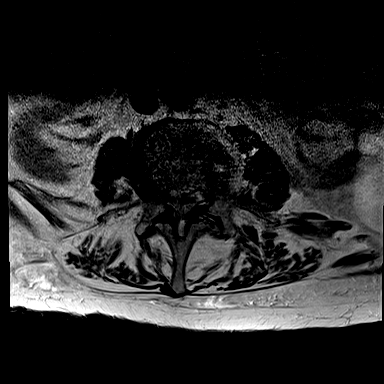
[im 20/33]
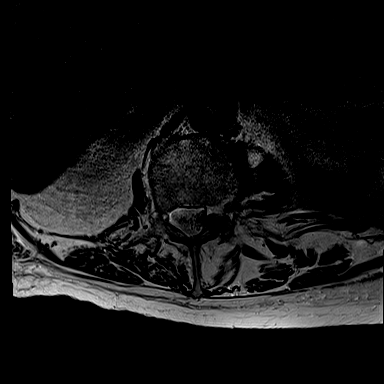
[im 26/33]
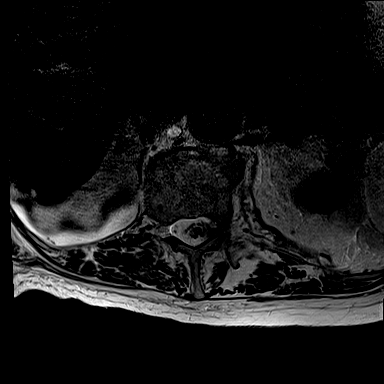
[im 33/33]
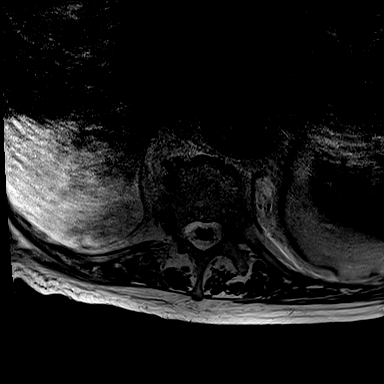

[Series 9: T1 · axial · right · 4.0mm · 0.34mm/px · z∈[-144,+58]mm · 6 of 33 slices shown (2 of 2)]
[im 1/33]
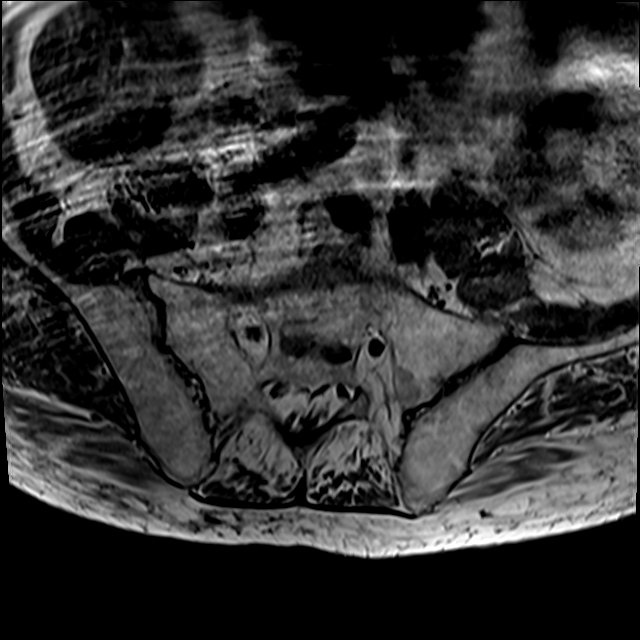
[im 7/33]
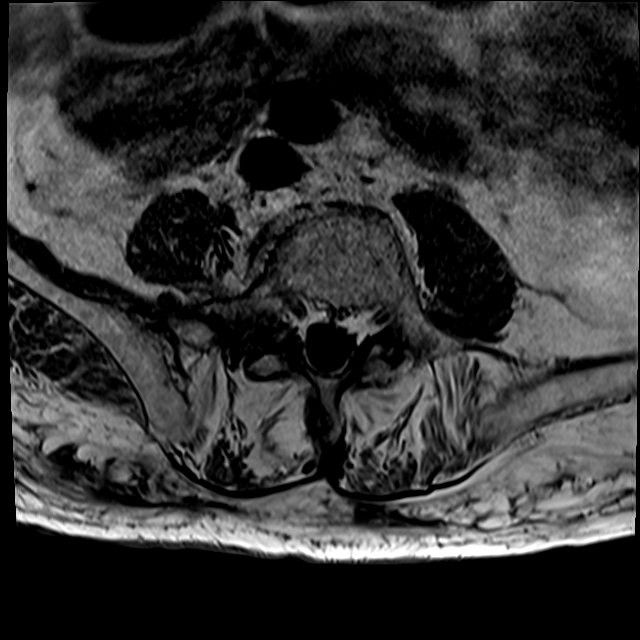
[im 13/33]
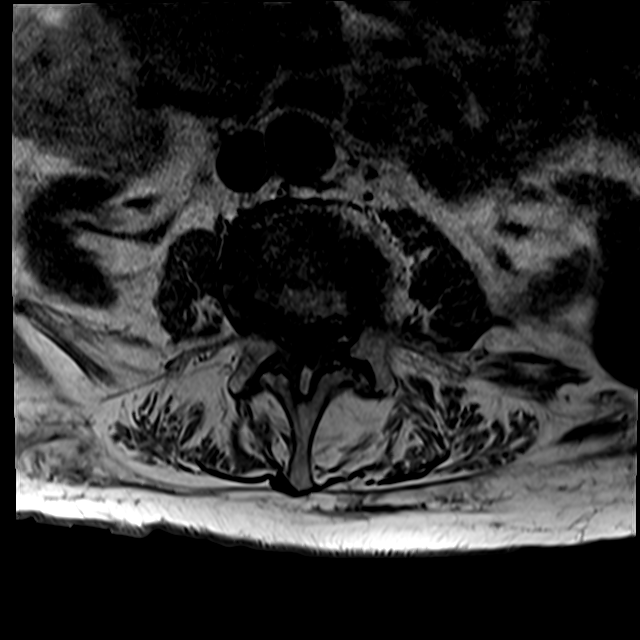
[im 20/33]
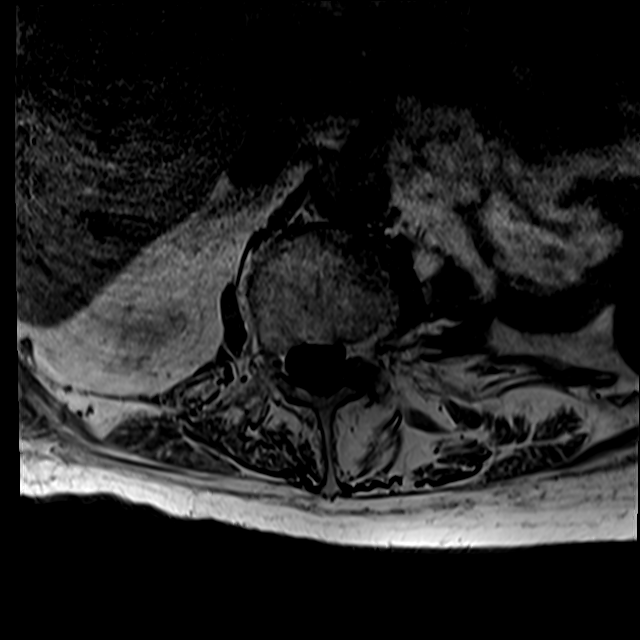
[im 26/33]
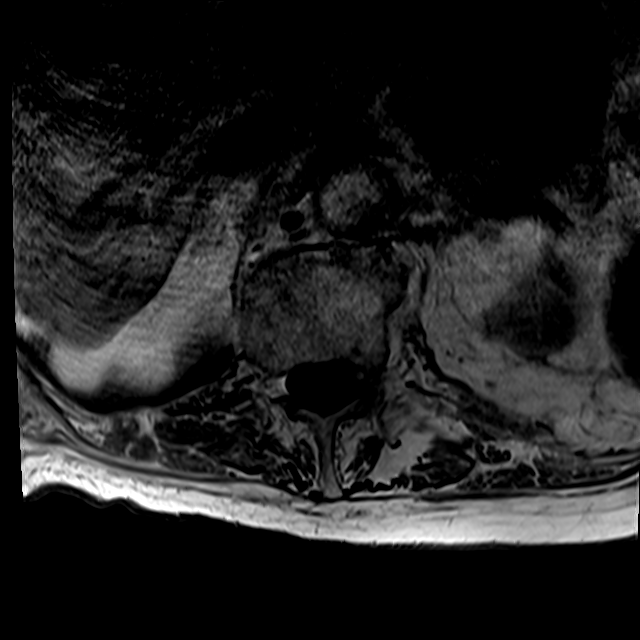
[im 33/33]
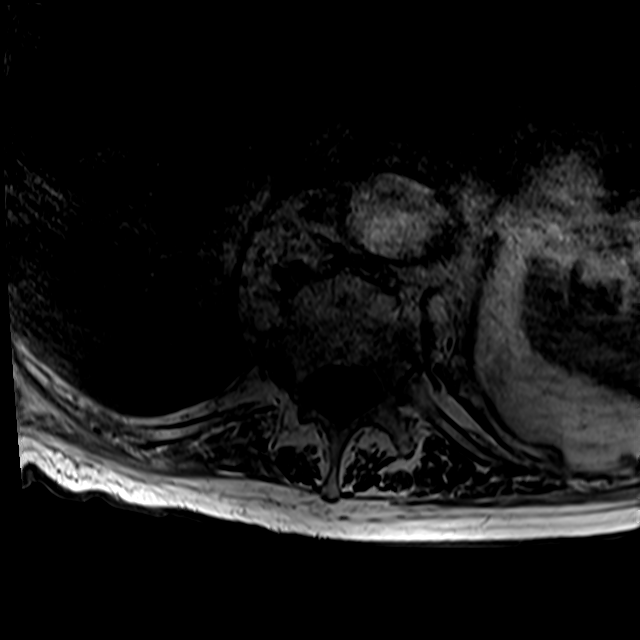

[22 of 48 positions shown; findings below may reference images not displayed]

FINDINGS: Segmentation:  Standard.

Alignment: Scoliosis, with dextrocurvature of the lumbar spine. Mild
retrolisthesis of L3 on L4.

Vertebrae:  No acute fracture or suspicious osseous lesion.

Conus medullaris and cauda equina: Conus extends to the L1-L2 level.
Conus and cauda equina appear normal.

Paraspinal and other soft tissues: Large left renal cyst.

Disc levels:

T12-L1: Severe disc height loss with minimal disc bulge. Mild facet
arthropathy. No spinal canal stenosis or neural foraminal narrowing.

L1-L2: Severe disc height loss with small disc osteophyte complex.
Mild facet arthropathy. Narrowing of the left lateral recess. No
spinal canal stenosis or neural foraminal narrowing.

L2-L3: Disc height loss with left eccentric disc osteophyte complex
and right eccentric disc bulge. Mild facet arthropathy. Narrowing of
the lateral recesses. Mild spinal canal stenosis. Mild left neural
foraminal narrowing.

L3-L4: Disc height loss with disc osteophyte complex moderate disc
bulge, and mild retrolisthesis. Moderate facet arthropathy. Moderate
spinal canal stenosis. Narrowing of the lateral recesses.
Mild-to-moderate left and mild right neural foraminal narrowing.

L4-L5: Disc height loss with disc osteophyte complex and minimal
disc bulge. Severe left and moderate right facet arthropathy.
Narrowing of the lateral recesses. No spinal canal stenosis. Mild
bilateral neural foraminal narrowing.

L5-S1: Disc height loss with disc osteophyte complex. Moderate facet
arthropathy. No spinal canal stenosis. Moderate right neural
foraminal narrowing.
IMPRESSION: 1. L3-L4 moderate spinal canal stenosis, with mild-to-moderate left
and mild right neural foraminal narrowing.
2. L2-L3 mild spinal canal stenosis and mild left neural foraminal
narrowing.
3. L4-L5 mild bilateral neural foraminal narrowing.
4. L5-S1 moderate right neural foraminal narrowing.
5. Narrowing of the lateral recesses at L1-L2, L2-L3, L3-L4, and
L4-L5, which could affect the descending L2, L3, L4, and L5 nerves,
respectively.

## 2022-05-02 IMAGING — MR MR HIP*R* W/O CM
7 of 10 series · 22 of 40 positions shown · non-contrast
Comparison: CT right hip [DATE]

CLINICAL DATA: Indeterminate CT for hip fracture. MRI was performed
for further characterization of persistent pain.

EXAM:
MR OF THE RIGHT HIP WITHOUT CONTRAST
TECHNIQUE: Multiplanar, multisequence MR imaging was performed. No intravenous
contrast was administered.

[Series 4: T1 · coronal · right · 4.0mm · 1.17mm/px · 4 of 44 slices shown]
[im 1/44]
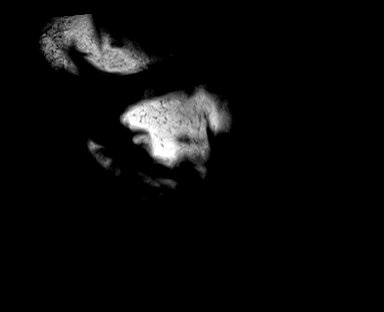
[im 15/44]
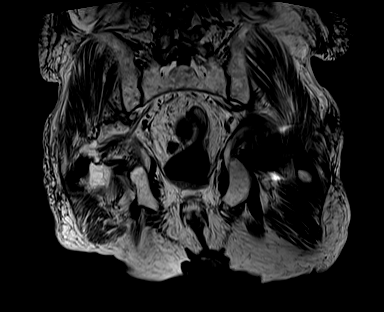
[im 29/44]
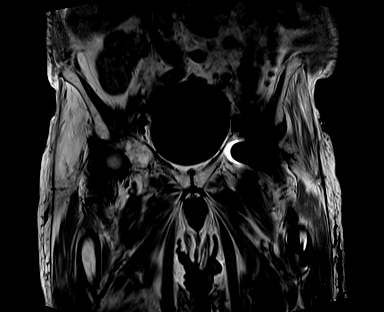
[im 44/44]
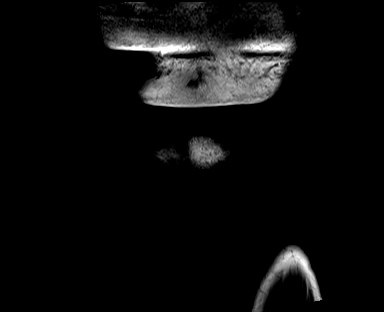

[Series 5: T2 fat-sat · coronal · right · 4.0mm · 1.00mm/px · 4 of 44 slices shown (1 of 2)]
[im 1/44]
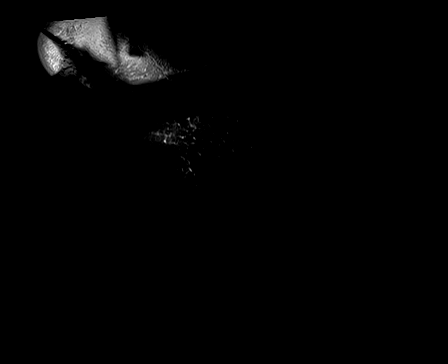
[im 15/44]
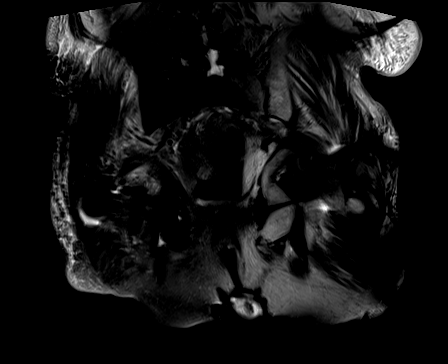
[im 29/44]
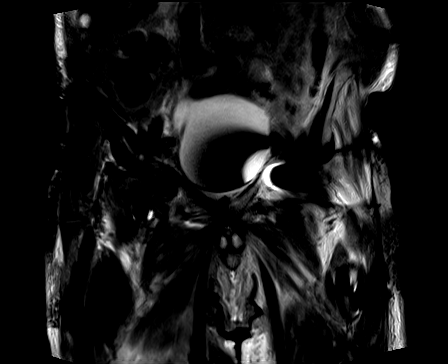
[im 44/44]
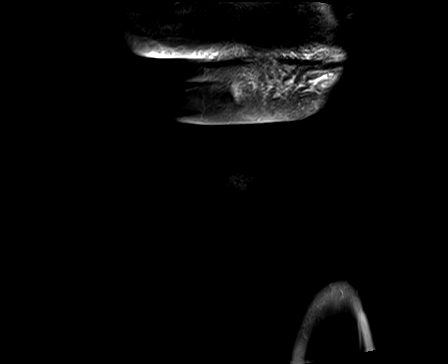

[Series 7: PD fat-sat · sagittal · right · 4.0mm · 0.56mm/px · 2 of 24 slices shown (1 of 3)]
[im 1/24]
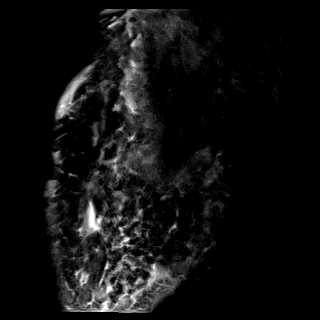
[im 24/24]
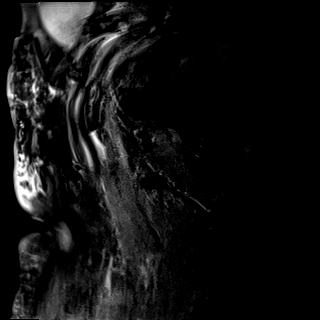

[Series 8: PD fat-sat · coronal · right · 3.0mm · 0.70mm/px · 2 of 22 slices shown (2 of 3)]
[im 1/22]
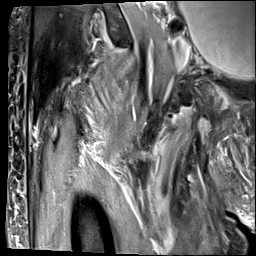
[im 22/22]
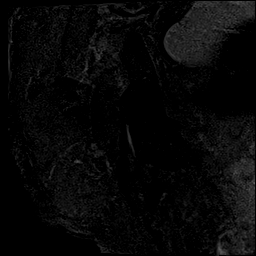

[Series 9: T1 fat-sat · axial · non-contrast · right · 4.0mm · 0.87mm/px · z∈[-324,-91]mm · 4 of 48 slices shown]
[im 1/48]
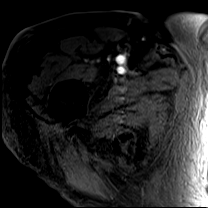
[im 16/48]
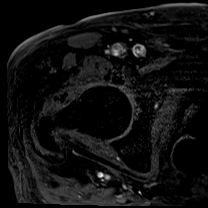
[im 32/48]
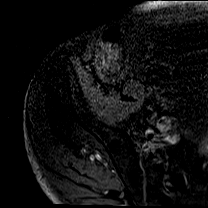
[im 48/48]
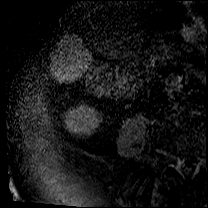

[Series 10: PD fat-sat · oblique · right · 4.0mm · 0.62mm/px · 2 of 30 slices shown (3 of 3)]
[im 1/30]
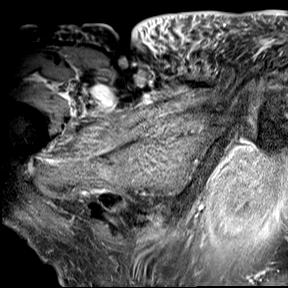
[im 30/30]
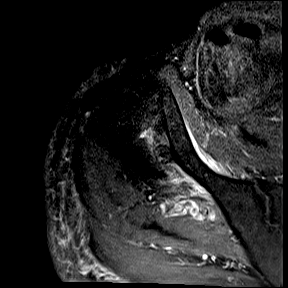

[Series 11: T2 fat-sat · axial · right · 4.0mm · 0.99mm/px · z∈[-328,-85]mm · 4 of 50 slices shown (2 of 2)]
[im 1/50]
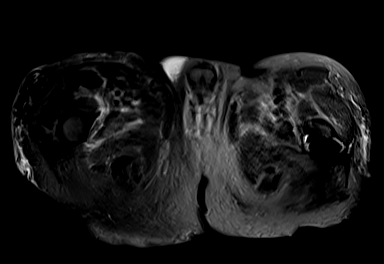
[im 17/50]
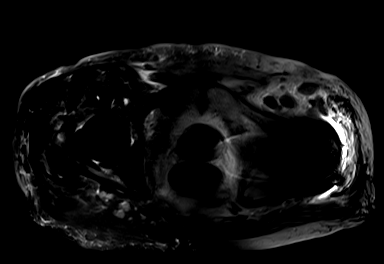
[im 33/50]
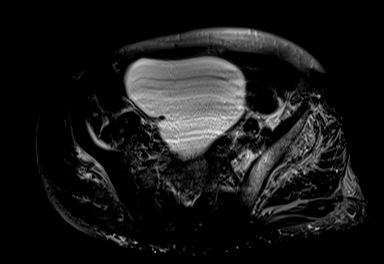
[im 50/50]
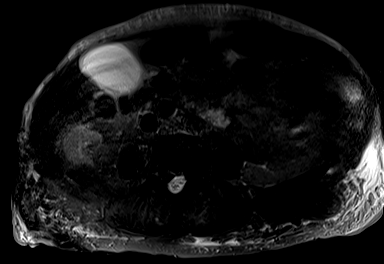

[22 of 40 positions shown; findings below may reference images not displayed]

FINDINGS: Bones:

Left hip arthroplasty with susceptibility artifact partially
obscuring the adjacent soft tissue and osseous structures. No
periarticular fluid collection or osteolysis.

No proximal right femoral fracture. Bone marrow edema in the right
superior acetabulum with a subtle linear component consistent with a
nondisplaced insufficiency fracture.

No periosteal reaction or bone destruction. No aggressive osseous
lesion.

Mild osteoarthritis of bilateral SI joints.

Degenerative disease with severe disc height loss at L3-4, L4-5 and
L5-S1.

Articular cartilage and labrum

Articular cartilage: Partial-thickness cartilage loss of the right
femoral head and acetabulum.

Labrum: Right labral degeneration with a degenerative tear of the
superior labrum.

Joint or bursal effusion

Joint effusion:  No hip joint effusion.  No SI joint effusion.

Bursae: Small amount of fluid in the right greater trochanteric
bursa.

Muscles and tendons

Flexors: Small amount of perifascial fluid deep to the iliacus
muscle bilaterally.

Extensors: Normal.

Abductors: Normal.

Adductors: Normal.

Gluteals: Partial-thickness tear of the right gluteus minimus tendon
insertion. Mild muscle edema in the right gluteus minimus muscle
consistent with muscle strain.

Hamstrings: Partial-thickness tear of the right hamstring origin.

Other findings

No pelvic free fluid. No fluid collection or hematoma. No inguinal
lymphadenopathy. No inguinal hernia. Severely distended bladder.
IMPRESSION: 1. Nondisplaced right superior acetabular insufficiency fracture
with surrounding marrow edema.
2. Partial-thickness tear of the right gluteus minimus tendon
insertion. Mild muscle edema in the right gluteus minimus muscle
consistent with muscle strain.

## 2022-05-02 MED ORDER — PROSIGHT PO TABS
1.0000 | ORAL_TABLET | Freq: Every day | ORAL | Status: DC
  Administered 2022-05-02: 1 via ORAL
  Filled 2022-05-02 (×4): qty 1

## 2022-05-02 MED ORDER — OMEGA-3-ACID ETHYL ESTERS 1 G PO CAPS
1.0000 g | ORAL_CAPSULE | Freq: Two times a day (BID) | ORAL | Status: DC
  Administered 2022-05-02 – 2022-05-05 (×4): 1 g via ORAL
  Filled 2022-05-02 (×8): qty 1

## 2022-05-02 MED ORDER — HYDROMORPHONE HCL 1 MG/ML IJ SOLN: 0.5000 mg | INTRAMUSCULAR | Status: DC | PRN

## 2022-05-02 MED ORDER — ACETAMINOPHEN 325 MG PO TABS
650.0000 mg | ORAL_TABLET | Freq: Four times a day (QID) | ORAL | Status: DC | PRN
  Administered 2022-05-03 – 2022-05-04 (×2): 650 mg via ORAL
  Filled 2022-05-02 (×2): qty 2

## 2022-05-02 MED ORDER — POLYETHYLENE GLYCOL 3350 17 G PO PACK: 17.0000 g | PACK | Freq: Every day | ORAL | Status: DC | PRN

## 2022-05-02 MED ORDER — OXYCODONE HCL 5 MG PO TABS
5.0000 mg | ORAL_TABLET | Freq: Four times a day (QID) | ORAL | Status: DC | PRN
  Filled 2022-05-02: qty 1

## 2022-05-02 MED ORDER — WARFARIN - PHARMACIST DOSING INPATIENT: Freq: Every day | Status: DC

## 2022-05-02 MED ORDER — MELATONIN 5 MG PO TABS
5.0000 mg | ORAL_TABLET | Freq: Every evening | ORAL | Status: DC | PRN
  Filled 2022-05-02: qty 1

## 2022-05-02 MED ORDER — DILTIAZEM HCL ER COATED BEADS 360 MG PO CP24
360.0000 mg | ORAL_CAPSULE | Freq: Every day | ORAL | Status: DC
  Administered 2022-05-02 – 2022-05-05 (×4): 360 mg via ORAL
  Filled 2022-05-02 (×4): qty 1

## 2022-05-02 NOTE — Progress Notes (Signed)
Pt admitted to unit from ED. Upon admission assessment, pt reports that he has advanced directive and paperwork at home for DNR status. Pt currently has orders for Full Code. Dr. Darrick Meigs notified. Per MD, notify on-call NP to verify code status when pt's family is able to bring paperwork to hospital.  Pt's son called at this time to request paperwork. He states that the paperwork is probably at the pt's house but he is uncertain of the exact location. He also states that he nor other family members are able to go to pt's house tonight to look for the papers.  I did make sure that pt's son understands that without the DNR paperwork, his dad will be treated as a Full code. He verbalizes understanding. I updated the pt as well as he is alert and oriented; pt also verbalizes understanding.

## 2022-05-02 NOTE — ED Notes (Signed)
Son and wife called and updated on plan of care.  Verbal consent received from patient to discuss all finding with family members.  Family would like to be contacted by Social Work during consult.

## 2022-05-02 NOTE — Consult Note (Cosign Needed)
Reason for Consult:Right acet fx Referring Physician: Eleonore Chiquito Time called: 0817 Time at bedside: [name] is an 88 y.o. male.  HPI: [name] presented to [facility] with right hip pain of 3-4d duration. He denies any preceding event but does say he's been much more active of late taking care of his wife. Workup showed an acetabulum fx and orthopedic surgery was consulted. He was admitted and transferred to [facility] for further treatment. He notes pain only when he bears weight. He ambulates with a RW at baseline.  Past Medical History:  Diagnosis Date   A-fib [facility])    Dysrhythmia    Atrial Tachycardia with block   Erosive esophagitis    GERD (gastroesophageal reflux disease)    H/O fracture of rib    Left  5th, 6th, 7th had a fall in APRIL 2016   Hypertension    RA (rheumatoid arthritis) ([place])     Past Surgical History:  Procedure Laterality Date   BALLOON DILATION N/A 05/04/2015   Procedure: BALLOON DILATION;  Surgeon: Wilford Corner, MD;  Location: [place];  Service: Endoscopy;  Laterality: N/A;  janie/melissa   CATARACT EXTRACTION     ESOPHAGOGASTRODUODENOSCOPY N/A 03/12/2015   Procedure: ESOPHAGOGASTRODUODENOSCOPY (EGD);  Surgeon: Wilford Corner, MD;  Location: [facility] ENDOSCOPY;  Service: Endoscopy;  Laterality: N/A;   ESOPHAGOGASTRODUODENOSCOPY N/A 05/04/2015   Procedure: ESOPHAGOGASTRODUODENOSCOPY (EGD);  Surgeon: Wilford Corner, MD;  Location: [facility] ENDOSCOPY;  Service: Endoscopy;  Laterality: N/A;  carm   EYE SURGERY Bilateral 10/2015   HIP ARTHROPLASTY Left 09/29/2017   Procedure: ARTHROPLASTY  HIP HEMIARTHROPLASTY left;  Surgeon: Paralee Cancel, MD;  Location: [facility];  Service: Orthopedics;  Laterality: Left;   SAVORY DILATION N/A 05/04/2015   Procedure: SAVORY DILATION;  Surgeon: Wilford Corner, MD;  Location: [facility] ENDOSCOPY;  Service: Endoscopy;  Laterality: N/A;    Family History  Problem Relation Age of Onset   Stroke Mother     Social History:  reports that he  has been smoking cigars. He has never used smokeless tobacco. He reports that he does not drink alcohol and does not use drugs.  Allergies:  Allergies  Allergen Reactions   Shellfish Allergy Anaphylaxis   Sulfa Antibiotics Anaphylaxis   Fish Allergy     Medications: I have reviewed the patient's current medications.  Results for orders placed or performed during the hospital encounter of 05/01/22 (from the past 48 hour(s))  CBG monitoring, ED     Status: Abnormal   Collection Time: 05/01/22  2:30 PM  Result Value Ref Range   Glucose-Capillary 110 (H) 70 - 99 mg/dL    Comment: Glucose reference range applies only to samples taken after fasting for at least 8 hours.  Basic metabolic panel     Status: Abnormal   Collection Time: 05/01/22  2:50 PM  Result Value Ref Range   Sodium 138 135 - 145 mmol/L   Potassium 4.7 3.5 - 5.1 mmol/L   Chloride 106 98 - 111 mmol/L   CO2 23 22 - 32 mmol/L   Glucose, Bld 111 (H) 70 - 99 mg/dL    Comment: Glucose reference range applies only to samples taken after fasting for at least 8 hours.   BUN 27 (H) 8 - 23 mg/dL   Creatinine, Ser 1.33 (H) 0.61 - 1.24 mg/dL   Calcium 8.9 8.9 - 10.3 mg/dL   GFR, Estimated 49 (L) >60 mL/min    Comment: (NOTE) Calculated using the CKD-EPI Creatinine Equation (2021)  Anion gap 9 5 - 15    Comment: Performed at [facility], [place], [place] [address]  CBC     Status: Abnormal   Collection Time: 05/01/22  2:50 PM  Result Value Ref Range   WBC 8.5 4.0 - 10.5 K/uL   RBC 2.87 (L) 4.22 - 5.81 MIL/uL   Hemoglobin 9.2 (L) 13.0 - 17.0 g/dL   HCT 28.5 (L) 39.0 - 52.0 %   MCV 99.3 80.0 - 100.0 fL   MCH 32.1 26.0 - 34.0 pg   MCHC 32.3 30.0 - 36.0 g/dL   RDW 15.6 (H) 11.5 - 15.5 %   Platelets 211 150 - 400 K/uL   nRBC 0.0 0.0 - 0.2 %    Comment: Performed at [facility], [place], [place] [address]  Urinalysis, Routine w reflex microscopic      Status: Abnormal   Collection Time: 05/02/22  2:55 AM  Result Value Ref Range   Color, Urine AMBER (A) YELLOW    Comment: BIOCHEMICALS MAY BE AFFECTED BY COLOR   APPearance CLOUDY (A) CLEAR   Specific Gravity, Urine 1.019 1.005 - 1.030   pH 6.0 5.0 - 8.0   Glucose, UA NEGATIVE NEGATIVE mg/dL   Hgb urine dipstick NEGATIVE NEGATIVE   Bilirubin Urine NEGATIVE NEGATIVE   Ketones, ur NEGATIVE NEGATIVE mg/dL   Protein, ur NEGATIVE NEGATIVE mg/dL   Nitrite NEGATIVE NEGATIVE   Leukocytes,Ua NEGATIVE NEGATIVE    Comment: Performed at [facility], [address], [place], [place] [address]  Protime-INR     Status: Abnormal   Collection Time: 05/02/22  2:55 AM  Result Value Ref Range   Prothrombin Time 39.1 (H) 11.4 - 15.2 seconds    Comment: CORRECTED ON 06/21 AT 0341: PREVIOUSLY REPORTED AS 38.2   INR 4.1 (HH) 0.8 - 1.2    Comment: REPEATED TO VERIFY CRITICAL RESULT CALLED TO, READ BACK BY AND VERIFIED WITH: Donata Clay, RN 05/02/22 0341 J. COLE (NOTE) INR goal varies based on device and disease states. Performed at [facility], [place] [address] [place], [place] [address] CORRECTED ON 06/21 AT 0341: PREVIOUSLY REPORTED AS 3.9   Type and screen     Status: None   Collection Time: 05/02/22  3:03 AM  Result Value Ref Range   ABO/RH(D) O NEG    Antibody Screen NEG    Sample Expiration      05/05/2022,2359 Performed at [facility], [place] [address], [place], [place] [address]   CBC with Differential/Platelet     Status: Abnormal   Collection Time: 05/02/22  7:25 AM  Result Value Ref Range   WBC 6.5 4.0 - 10.5 K/uL   RBC 2.46 (L) 4.22 - 5.81 MIL/uL   Hemoglobin 8.3 (L) 13.0 - 17.0 g/dL   HCT 24.9 (L) 39.0 - 52.0 %   MCV 101.2 (H) 80.0 - 100.0 fL   MCH 33.7 26.0 - 34.0 pg   MCHC 33.3 30.0 - 36.0 g/dL   RDW 15.7 (H) 11.5 - 15.5 %   Platelets 191 150 - 400 K/uL   nRBC 0.0 0.0 - 0.2 %   Neutrophils Relative % 78 %   Neutro Abs 5.0 1.7 - 7.7 K/uL   Lymphocytes Relative  6 %   Lymphs Abs 0.4 (L) 0.7 - 4.0 K/uL   Monocytes Relative 15 %   Monocytes Absolute 1.0 0.1 - 1.0 K/uL   Eosinophils Relative 1 %  Eosinophils Absolute 0.1 0.0 - 0.5 K/uL   Basophils Relative 0 %   Basophils Absolute 0.0 0.0 - 0.1 K/uL   Immature Granulocytes 0 %   Abs Immature Granulocytes 0.02 0.00 - 0.07 K/uL    Comment: Performed at [facility], [place] [address], [place], [place] [address]  Comprehensive metabolic panel     Status: Abnormal   Collection Time: 05/02/22  7:25 AM  Result Value Ref Range   Sodium 138 135 - 145 mmol/L   Potassium 4.4 3.5 - 5.1 mmol/L   Chloride 110 98 - 111 mmol/L   CO2 22 22 - 32 mmol/L   Glucose, Bld 95 70 - 99 mg/dL    Comment: Glucose reference range applies only to samples taken after fasting for at least 8 hours.   BUN 23 8 - 23 mg/dL   Creatinine, Ser 1.18 0.61 - 1.24 mg/dL   Calcium 8.2 (L) 8.9 - 10.3 mg/dL   Total Protein 5.6 (L) 6.5 - 8.1 g/dL   Albumin 2.6 (L) 3.5 - 5.0 g/dL   AST 19 15 - 41 U/L   ALT 13 0 - 44 U/L   Alkaline Phosphatase 87 38 - 126 U/L   Total Bilirubin 0.9 0.3 - 1.2 mg/dL   GFR, Estimated 56 (L) >60 mL/min    Comment: (NOTE) Calculated using the CKD-EPI Creatinine Equation (2021)    Anion gap 6 5 - 15    Comment: Performed at [facility], [place] [address], [place], [place] [address]  Magnesium     Status: None   Collection Time: 05/02/22  7:25 AM  Result Value Ref Range   Magnesium 2.0 1.7 - 2.4 mg/dL    Comment: Performed at [facility] [address] [place], [place] [address]  Phosphorus     Status: None   Collection Time: 05/02/22  7:25 AM  Result Value Ref Range   Phosphorus 2.9 2.5 - 4.6 mg/dL    Comment: Performed at [facility] [address], [place], [place] [address]    MR HIP RIGHT WO CONTRAST  Result Date: 05/02/2022 CLINICAL DATA:  Indeterminate CT for hip fracture. MRI was performed for further characterization of persistent pain. EXAM: MR OF THE RIGHT HIP  WITHOUT CONTRAST TECHNIQUE: Multiplanar, multisequence MR imaging was performed. No intravenous contrast was administered. COMPARISON:  CT right hip 05/01/2022 FINDINGS: Bones: Left hip arthroplasty with susceptibility artifact partially obscuring the adjacent soft tissue and osseous structures. No periarticular fluid collection or osteolysis. No proximal right femoral fracture. Bone marrow edema in the right superior acetabulum with a subtle linear component consistent with a nondisplaced insufficiency fracture. No periosteal reaction or bone destruction. No aggressive osseous lesion. Mild osteoarthritis of bilateral SI joints. Degenerative disease with severe disc height loss at L3-4, L4-5 and L5-S1. Articular cartilage and labrum Articular cartilage: Partial-thickness cartilage loss of the right femoral head and acetabulum. Labrum: Right labral degeneration with a degenerative tear of the superior labrum. Joint or bursal effusion Joint effusion:  No hip joint effusion.  No SI joint effusion. Bursae: Small amount of fluid in the right greater trochanteric bursa. Muscles and tendons Flexors: Small amount of perifascial fluid deep to the iliacus muscle bilaterally. Extensors: Normal. Abductors: Normal. Adductors: Normal. Gluteals: Partial-thickness tear of the right gluteus minimus tendon insertion. Mild muscle edema in the right gluteus minimus muscle consistent with muscle strain. Hamstrings: Partial-thickness tear of the right hamstring origin. Other findings No pelvic free fluid. No  fluid collection or hematoma. No inguinal lymphadenopathy. No inguinal hernia. Severely distended bladder. IMPRESSION: 1. Nondisplaced right superior acetabular insufficiency fracture with surrounding marrow edema. 2. Partial-thickness tear of the right gluteus minimus tendon insertion. Mild muscle edema in the right gluteus minimus muscle consistent with muscle strain. Electronically Signed   By: Kathreen Devoid M.D.   On: 05/02/2022  06:45   MR LUMBAR SPINE WO CONTRAST  Result Date: 05/02/2022 CLINICAL DATA:  Low back pain, cauda equina syndrome suspected EXAM: MRI LUMBAR SPINE WITHOUT CONTRAST TECHNIQUE: Multiplanar, multisequence MR imaging of the lumbar spine was performed. No intravenous contrast was administered. COMPARISON:  No prior MRI of the lumbar spine, correlation is made with CT lumbar spine 05/01/2022 FINDINGS: Segmentation:  Standard. Alignment: Scoliosis, with dextrocurvature of the lumbar spine. Mild retrolisthesis of L3 on L4. Vertebrae:  No acute fracture or suspicious osseous lesion. Conus medullaris and cauda equina: Conus extends to the L1-L2 level. Conus and cauda equina appear normal. Paraspinal and other soft tissues: Large left renal cyst. Disc levels: T12-L1: Severe disc height loss with minimal disc bulge. Mild facet arthropathy. No spinal canal stenosis or neural foraminal narrowing. L1-L2: Severe disc height loss with small disc osteophyte complex. Mild facet arthropathy. Narrowing of the left lateral recess. No spinal canal stenosis or neural foraminal narrowing. L2-L3: Disc height loss with left eccentric disc osteophyte complex and right eccentric disc bulge. Mild facet arthropathy. Narrowing of the lateral recesses. Mild spinal canal stenosis. Mild left neural foraminal narrowing. L3-L4: Disc height loss with disc osteophyte complex moderate disc bulge, and mild retrolisthesis. Moderate facet arthropathy. Moderate spinal canal stenosis. Narrowing of the lateral recesses. Mild-to-moderate left and mild right neural foraminal narrowing. L4-L5: Disc height loss with disc osteophyte complex and minimal disc bulge. Severe left and moderate right facet arthropathy. Narrowing of the lateral recesses. No spinal canal stenosis. Mild bilateral neural foraminal narrowing. L5-S1: Disc height loss with disc osteophyte complex. Moderate facet arthropathy. No spinal canal stenosis. Moderate right neural foraminal  narrowing. IMPRESSION: 1. L3-L4 moderate spinal canal stenosis, with mild-to-moderate left and mild right neural foraminal narrowing. 2. L2-L3 mild spinal canal stenosis and mild left neural foraminal narrowing. 3. L4-L5 mild bilateral neural foraminal narrowing. 4. L5-S1 moderate right neural foraminal narrowing. 5. Narrowing of the lateral recesses at L1-L2, L2-L3, L3-L4, and L4-L5, which could affect the descending L2, L3, L4, and L5 nerves, respectively. Electronically Signed   By: Merilyn Baba M.D.   On: 05/02/2022 02:45   CT Lumbar Spine Wo Contrast  Result Date: 05/01/2022 CLINICAL DATA:  Lower back pain. Cauda equina syndrome suspected. Right hip/lower back pain with right leg weakness. Unable to walk with a walker today. EXAM: CT LUMBAR SPINE WITHOUT CONTRAST TECHNIQUE: Multidetector CT imaging of the lumbar spine was performed without intravenous contrast administration. Multiplanar CT image reconstructions were also generated. RADIATION DOSE REDUCTION: This exam was performed according to the departmental dose-optimization program which includes automated exposure control, adjustment of the mA and/or kV according to patient size and/or use of iterative reconstruction technique. COMPARISON:  Pelvis and right hip radiographs 05/01/2022 FINDINGS: Segmentation: Standard. Alignment: There is moderate dextrocurvature centered at L2. Mild 2 mm retrolisthesis of L4 on L5. Vertebrae: Vertebral body heights are maintained. Severe multilevel disc space narrowing, greatest within the anterior right T12-L1 left L1-2, mid to left L2-3, right L3-4, diffuse L4-5, and right L5-S1 levels. Degenerative vacuum phenomenon at the T10-11 and L3-4 through L5-S1 levels. Paraspinal and other soft tissues: The lung bases are  minimally partially visualized. There appear to be right-greater-than-left pleural effusions. Moderate to high-grade atherosclerotic calcifications within the partially visualized aorta and iliac arteries.  Disc levels: T11-12: Moderate right-greater-than-left facet joint hypertrophy. Scoliotic curvature. Moderate left osseous neuroforaminal stenosis. Moderate narrowing of the lateral recesses and borderline mild central canal stenosis. T12-L1: Moderate bilateral facet joint hypertrophy. Mild left osseous neuroforaminal stenosis. L1-2: Moderate bilateral facet joint hypertrophy. Mild posterior endplate ridging. Mild left neuroforaminal stenosis. Borderline mild central canal stenosis. L2-3: Moderate bilateral facet joint hypertrophy. Left-greater-than-right posterior disc osteophyte complex. Moderate left neuroforaminal stenosis. Moderate narrowing of the lateral recesses and mild-to-moderate central canal stenosis. L3-4: Moderate to severe bilateral facet joint hypertrophy. Moderate broad-based posterior disc osteophyte complex with bilateral intraforaminal extension. Moderate left and mild-to-moderate right neuroforaminal stenosis. Severe right lateral recess stenosis. Likely moderate central canal stenosis. L4-5: Moderate bilateral facet joint hypertrophy. Mild-to-moderate broad-based posterior disc osteophyte complex. Mild right-greater-than-left neuroforaminal stenosis. Moderate narrowing of the lateral recesses and moderate central canal stenosis. L5-S1: Moderate right and mild left facet joint hypertrophy. Mild-to-moderate broad-based posterior disc osteophyte complex with right intraforaminal extension. Mild-to-moderate right neuroforaminal stenosis. Mild narrowing of lateral recesses. No significant central canal stenosis. IMPRESSION: 1. Moderate to severe multilevel degenerative disc and joint changes as above. 2. Multilevel neuroforaminal stenosis including moderate left L2-3, moderate left and mild-to-moderate right L3-4, mild right-greater-than-left L4-5, and mild-to-moderate right L5-S1 osseous neuroforaminal stenosis. 3. Multilevel central canal stenosis as above mild-to-moderate at L2-3 and moderate  L3-4 and L4-5. Electronically Signed   By: Yvonne Kendall M.D.   On: 05/01/2022 21:11   CT Hip Right Wo Contrast  Result Date: 05/01/2022 CLINICAL DATA:  Irregularity of right superior pubic ramus on recent plain film EXAM: CT OF THE RIGHT HIP WITHOUT CONTRAST TECHNIQUE: Multidetector CT imaging of the right hip was performed according to the standard protocol. Multiplanar CT image reconstructions were also generated. RADIATION DOSE REDUCTION: This exam was performed according to the departmental dose-optimization program which includes automated exposure control, adjustment of the mA and/or kV according to patient size and/or use of iterative reconstruction technique. COMPARISON:  Plain film from earlier in the same day. FINDINGS: Bones/Joint/Cartilage Diffuse osteopenia is noted. No acute fracture is identified. No dislocation is seen. The superior pubic ramus on the right appears intact. The area of abnormality seen on recent plain film is not borne out this exam. Ligaments Suboptimally assessed by CT. Muscles and Tendons Surrounding musculature appears within normal limits with the exception of some fatty replacement of the right gluteus minimus. Soft tissues No evidence of joint effusion is seen. No soft tissue abnormality is noted. Diffuse vascular calcifications are seen. IMPRESSION: No acute fracture is identified. The area of abnormality in the superior pubic ramus on the right is not borne out on this exam. Mild muscular atrophy with fatty replacement involving the gluteus minimus on the right. Electronically Signed   By: Inez Catalina M.D.   On: 05/01/2022 19:55   DG Hip Unilat W or Wo Pelvis 2-3 Views Right  Result Date: 05/01/2022 CLINICAL DATA:  A 88 year old male presents with concern for hip fracture. EXAM: DG HIP (WITH OR WITHOUT PELVIS) 2-3V RIGHT COMPARISON:  March 11, 2015 and November of 2018. FINDINGS: Osteopenia. RIGHT hip is located.  No signs of RIGHT hip fracture. Mild irregularity of  the superior pubic ramus on the RIGHT. No definite irregularity of the inferior pubic ramus. Signs of LEFT total hip arthroplasty. Stool and gas overlies the iliac crest bilaterally. Slightly nonstandard positioning with  rotation of the pelvis does limit assessment of the RIGHT iliac. IMPRESSION: 1. Mild irregularity of the superior pubic ramus on the RIGHT. No definite irregularity of the inferior pubic ramus. Difficult to exclude a subtle nondisplaced fracture or developing insufficiency changes. 2. Osteopenia. 3. Signs of LEFT total hip arthroplasty. Electronically Signed   By: Zetta Bills M.D.   On: 05/01/2022 14:58    Review of Systems  HENT:  Negative for ear discharge, ear pain, hearing loss and tinnitus.   Eyes:  Negative for photophobia and pain.  Respiratory:  Negative for cough and shortness of breath.   Cardiovascular:  Negative for chest pain.  Gastrointestinal:  Negative for abdominal pain, nausea and vomiting.  Genitourinary:  Negative for dysuria, flank pain, frequency and urgency.  Musculoskeletal:  Positive for arthralgias (Right hip). Negative for back pain, myalgias and neck pain.  Neurological:  Negative for dizziness and headaches.  Hematological:  Does not bruise/bleed easily.  Psychiatric/Behavioral:  The patient is not nervous/anxious.    Blood pressure 137/88, pulse 97, temperature 98.1 F (36.7 C), temperature source Oral, resp. rate 20, SpO2 (!) 88 %. Physical Exam Constitutional:      General: He is not in acute distress.    Appearance: He is well-developed. He is not diaphoretic.  HENT:     Head: Normocephalic and atraumatic.  Eyes:     General: No scleral icterus.       Right eye: No discharge.        Left eye: No discharge.     Conjunctiva/sclera: Conjunctivae normal.  Cardiovascular:     Rate and Rhythm: Normal rate and regular rhythm.  Pulmonary:     Effort: Pulmonary effort is normal. No respiratory distress.  Musculoskeletal:     Cervical back:  Normal range of motion.     Comments: RLE No traumatic wounds, ecchymosis, or rash  Nontender  No knee or ankle effusion  Knee stable to varus/ valgus and anterior/posterior stress  Sens DPN, SPN, TN intact  Motor EHL, ext, flex, evers 5/5  DP 1+, PT 1+, No significant edema  Skin:    General: Skin is warm and dry.  Neurological:     Mental Status: He is alert.  Psychiatric:        Mood and Affect: Mood normal.        Behavior: Behavior normal.     Assessment/Plan: Right acet fx -- Likely insufficiency fx. Will plan non-operative management with [facility] RLE. F/u with Dr. Alvan Dame in 3 weeks.    Lisette Abu, PA-C Orthopedic Surgery [phone] 05/02/2022, 9:19 AM

## 2022-05-02 NOTE — H&P (Signed)
History and Physical  [name] JTT:[id number] DOB: 08-Jun-1923 DOA: 05/01/2022  Referring physician: Valda Favia  PCP: Merrilee Seashore, MD  Outpatient Specialists: Orthopedic surgery Patient coming from: Home.  Chief Complaint: Weakness in lower extremities.  HPI: [name] is a 88 y.o. male with medical history significant for permanent A-fib on Coumadin, rheumatoid arthritis, GERD, hypertension, who initially presented to [facility] ED with complaints of inability to walk x3 days due to severe right hip pain.  His right hip pain has become severe.  At baseline uses a walker to ambulate.  He denies any falls.  He bumped the left side of his chest on the side of the bathtub, ecchymosis are present.  The patient was transferred to [facility] for MRI lumbar spine and right hip.  At [facility] the patient was found to have an acetabular fracture.  EDP discussed the case with orthopedic surgery Dr. Onnie Graham who recommended non operative management.  Non weightbearing and progress to walker.  They will see the patient in consultation.  EDP requested admission.  Admitted by the Hospitalist service, TRH.  ED Course: Tmax 98.3.  BP 142/83, pulse 97, respiratory 16, saturation 97% on room air.  Lab studies remarkable for BUN 27, creatinine 1.23.  GFR 49.  Hemoglobin 9.2 from 11.4 about a month ago.  Review of Systems: Review of systems as noted in the HPI. All other systems reviewed and are negative.   Past Medical History:  Diagnosis Date   A-fib [facility])    Dysrhythmia    Atrial Tachycardia with block   Erosive esophagitis    GERD (gastroesophageal reflux disease)    H/O fracture of rib    Left  5th, 6th, 7th had a fall in APRIL 2016   Hypertension    RA (rheumatoid arthritis) ([place])    Past Surgical History:  Procedure Laterality Date   BALLOON DILATION N/A 05/04/2015   Procedure: BALLOON DILATION;  Surgeon: Wilford Corner, MD;  Location: [place];   Service: Endoscopy;  Laterality: N/A;  janie/melissa   CATARACT EXTRACTION     ESOPHAGOGASTRODUODENOSCOPY N/A 03/12/2015   Procedure: ESOPHAGOGASTRODUODENOSCOPY (EGD);  Surgeon: Wilford Corner, MD;  Location: [facility] ENDOSCOPY;  Service: Endoscopy;  Laterality: N/A;   ESOPHAGOGASTRODUODENOSCOPY N/A 05/04/2015   Procedure: ESOPHAGOGASTRODUODENOSCOPY (EGD);  Surgeon: Wilford Corner, MD;  Location: [facility] ENDOSCOPY;  Service: Endoscopy;  Laterality: N/A;  carm   EYE SURGERY Bilateral 10/2015   HIP ARTHROPLASTY Left 09/29/2017   Procedure: ARTHROPLASTY  HIP HEMIARTHROPLASTY left;  Surgeon: Paralee Cancel, MD;  Location: [facility] ORS;  Service: Orthopedics;  Laterality: Left;   SAVORY DILATION N/A 05/04/2015   Procedure: SAVORY DILATION;  Surgeon: Wilford Corner, MD;  Location: [facility] ENDOSCOPY;  Service: Endoscopy;  Laterality: N/A;    Social History:  reports that he has been smoking cigars. He has never used smokeless tobacco. He reports that he does not drink alcohol and does not use drugs.   Allergies  Allergen Reactions   Shellfish Allergy Anaphylaxis   Sulfa Antibiotics Anaphylaxis   Fish Allergy     Family History  Problem Relation Age of Onset   Stroke Mother       Prior to Admission medications   Medication Sig Start Date End Date Taking? Authorizing Provider  Besifloxacin HCl 0.6 % SUSP Besivance 0.6 % eye drops,suspension    [provider]  COMBIGAN 0.2-0.5 % ophthalmic solution Place 1 drop into the right eye 2 (two) times daily. 12/15/19   [provider]  diltiazem (  TIAZAC) 360 MG 24 hr capsule Take 360 mg by mouth daily. 02/20/15   [provider]  docusate sodium (COLACE) 100 MG capsule Take 1 capsule (100 mg total) 2 (two) times daily by mouth. 10/01/17   Danae Orleans, PA-C  ferrous sulfate 325 (65 FE) MG tablet Take 1 tablet (325 mg total) 3 (three) times daily after meals by mouth. 10/01/17   Danae Orleans, PA-C  metoprolol succinate (TOPROL-XL) 50 MG  24 hr tablet Take 50 mg by mouth daily.  04/08/15   [provider]  moxifloxacin (VIGAMOX) 0.5 % ophthalmic solution Apply to eye. 01/26/21   [provider]  Multiple Vitamins-Minerals (PRESERVISION AREDS 2) CAPS Take 1 capsule by mouth 2 (two) times daily.    [provider]  Omega-3 Fatty Acids (FISH OIL) 1000 MG CAPS Take 1 capsule (1,000 mg total) by mouth 2 (two) times daily. 10/04/17   Patrecia Pour, MD  pantoprazole (PROTONIX) 40 MG tablet Take 1 tablet (40 mg total) by mouth 2 (two) times daily. 10/04/17   Patrecia Pour, MD  ROCKLATAN 0.02-0.005 % SOLN SMARTSIG:1 Drop(s) Right Eye Every Evening 05/17/20   [provider]  timolol (TIMOPTIC) 0.25 % ophthalmic solution 1 drop into affected eye    [provider]  timolol (TIMOPTIC) 0.5 % ophthalmic solution Place 1 drop into the right eye every morning. 12/05/20   [provider]  warfarin (COUMADIN) 2 MG tablet TAKE 1 TABLET BY MOUTH EVERY DAY EXCEPT ON WEDNESDAY TAKE 2 TABS 09/22/17   [provider]    Physical Exam: BP (!) 142/83 (BP Location: Right Arm)   Pulse 97   Temp 98.3 F (36.8 C) (Oral)   Resp 16   SpO2 97%   General: 88 y.o. year-old male well developed well nourished in no acute distress.  Alert and oriented x3. Cardiovascular: Irregular rate and rhythm with no rubs or gallops.  No thyromegaly or JVD noted.  1+ pitting edema lower extremities bilaterally.  Lower extremity edema. 2/4 pulses in all 4 extremities. Respiratory: Clear to auscultation with no wheezes or rales. Good inspiratory effort. Abdomen: Soft nontender nondistended with normal bowel sounds x4 quadrants. Muskuloskeletal: No cyanosis or clubbing.  Bilateral lower extremity edema. Neuro: CN II-XII intact, strength, sensation, reflexes Skin: Bruising noted left chest, lower extremities. Psychiatry: Judgement and insight appear normal. Mood is appropriate for condition and setting          Labs  on Admission:  Basic Metabolic Panel: Recent Labs  Lab 05/01/22 1450  NA 138  K 4.7  CL 106  CO2 23  GLUCOSE 111*  BUN 27*  CREATININE 1.33*  CALCIUM 8.9   Liver Function Tests: No results for input(s): "AST", "ALT", "ALKPHOS", "BILITOT", "PROT", "ALBUMIN" in the last 168 hours. No results for input(s): "LIPASE", "AMYLASE" in the last 168 hours. No results for input(s): "AMMONIA" in the last 168 hours. CBC: Recent Labs  Lab 05/01/22 1450  WBC 8.5  HGB 9.2*  HCT 28.5*  MCV 99.3  PLT 211   Cardiac Enzymes: No results for input(s): "CKTOTAL", "CKMB", "CKMBINDEX", "TROPONINI" in the last 168 hours.  BNP (last 3 results) No results for input(s): "BNP" in the last 8760 hours.  ProBNP (last 3 results) No results for input(s): "PROBNP" in the last 8760 hours.  CBG: Recent Labs  Lab 05/01/22 1430  GLUCAP 110*    Radiological Exams on Admission: MR LUMBAR SPINE WO CONTRAST  Result Date: 05/02/2022 CLINICAL DATA:  Low back  pain, cauda equina syndrome suspected EXAM: MRI LUMBAR SPINE WITHOUT CONTRAST TECHNIQUE: Multiplanar, multisequence MR imaging of the lumbar spine was performed. No intravenous contrast was administered. COMPARISON:  No prior MRI of the lumbar spine, correlation is made with CT lumbar spine 05/01/2022 FINDINGS: Segmentation:  Standard. Alignment: Scoliosis, with dextrocurvature of the lumbar spine. Mild retrolisthesis of L3 on L4. Vertebrae:  No acute fracture or suspicious osseous lesion. Conus medullaris and cauda equina: Conus extends to the L1-L2 level. Conus and cauda equina appear normal. Paraspinal and other soft tissues: Large left renal cyst. Disc levels: T12-L1: Severe disc height loss with minimal disc bulge. Mild facet arthropathy. No spinal canal stenosis or neural foraminal narrowing. L1-L2: Severe disc height loss with small disc osteophyte complex. Mild facet arthropathy. Narrowing of the left lateral recess. No spinal canal stenosis or neural  foraminal narrowing. L2-L3: Disc height loss with left eccentric disc osteophyte complex and right eccentric disc bulge. Mild facet arthropathy. Narrowing of the lateral recesses. Mild spinal canal stenosis. Mild left neural foraminal narrowing. L3-L4: Disc height loss with disc osteophyte complex moderate disc bulge, and mild retrolisthesis. Moderate facet arthropathy. Moderate spinal canal stenosis. Narrowing of the lateral recesses. Mild-to-moderate left and mild right neural foraminal narrowing. L4-L5: Disc height loss with disc osteophyte complex and minimal disc bulge. Severe left and moderate right facet arthropathy. Narrowing of the lateral recesses. No spinal canal stenosis. Mild bilateral neural foraminal narrowing. L5-S1: Disc height loss with disc osteophyte complex. Moderate facet arthropathy. No spinal canal stenosis. Moderate right neural foraminal narrowing. IMPRESSION: 1. L3-L4 moderate spinal canal stenosis, with mild-to-moderate left and mild right neural foraminal narrowing. 2. L2-L3 mild spinal canal stenosis and mild left neural foraminal narrowing. 3. L4-L5 mild bilateral neural foraminal narrowing. 4. L5-S1 moderate right neural foraminal narrowing. 5. Narrowing of the lateral recesses at L1-L2, L2-L3, L3-L4, and L4-L5, which could affect the descending L2, L3, L4, and L5 nerves, respectively. Electronically Signed   By: Merilyn Baba M.D.   On: 05/02/2022 02:45   CT Lumbar Spine Wo Contrast  Result Date: 05/01/2022 CLINICAL DATA:  Lower back pain. Cauda equina syndrome suspected. Right hip/lower back pain with right leg weakness. Unable to walk with a walker today. EXAM: CT LUMBAR SPINE WITHOUT CONTRAST TECHNIQUE: Multidetector CT imaging of the lumbar spine was performed without intravenous contrast administration. Multiplanar CT image reconstructions were also generated. RADIATION DOSE REDUCTION: This exam was performed according to the departmental dose-optimization program which  includes automated exposure control, adjustment of the mA and/or kV according to patient size and/or use of iterative reconstruction technique. COMPARISON:  Pelvis and right hip radiographs 05/01/2022 FINDINGS: Segmentation: Standard. Alignment: There is moderate dextrocurvature centered at L2. Mild 2 mm retrolisthesis of L4 on L5. Vertebrae: Vertebral body heights are maintained. Severe multilevel disc space narrowing, greatest within the anterior right T12-L1 left L1-2, mid to left L2-3, right L3-4, diffuse L4-5, and right L5-S1 levels. Degenerative vacuum phenomenon at the T10-11 and L3-4 through L5-S1 levels. Paraspinal and other soft tissues: The lung bases are minimally partially visualized. There appear to be right-greater-than-left pleural effusions. Moderate to high-grade atherosclerotic calcifications within the partially visualized aorta and iliac arteries. Disc levels: T11-12: Moderate right-greater-than-left facet joint hypertrophy. Scoliotic curvature. Moderate left osseous neuroforaminal stenosis. Moderate narrowing of the lateral recesses and borderline mild central canal stenosis. T12-L1: Moderate bilateral facet joint hypertrophy. Mild left osseous neuroforaminal stenosis. L1-2: Moderate bilateral facet joint hypertrophy. Mild posterior endplate ridging. Mild left neuroforaminal stenosis. Borderline mild central  canal stenosis. L2-3: Moderate bilateral facet joint hypertrophy. Left-greater-than-right posterior disc osteophyte complex. Moderate left neuroforaminal stenosis. Moderate narrowing of the lateral recesses and mild-to-moderate central canal stenosis. L3-4: Moderate to severe bilateral facet joint hypertrophy. Moderate broad-based posterior disc osteophyte complex with bilateral intraforaminal extension. Moderate left and mild-to-moderate right neuroforaminal stenosis. Severe right lateral recess stenosis. Likely moderate central canal stenosis. L4-5: Moderate bilateral facet joint  hypertrophy. Mild-to-moderate broad-based posterior disc osteophyte complex. Mild right-greater-than-left neuroforaminal stenosis. Moderate narrowing of the lateral recesses and moderate central canal stenosis. L5-S1: Moderate right and mild left facet joint hypertrophy. Mild-to-moderate broad-based posterior disc osteophyte complex with right intraforaminal extension. Mild-to-moderate right neuroforaminal stenosis. Mild narrowing of lateral recesses. No significant central canal stenosis. IMPRESSION: 1. Moderate to severe multilevel degenerative disc and joint changes as above. 2. Multilevel neuroforaminal stenosis including moderate left L2-3, moderate left and mild-to-moderate right L3-4, mild right-greater-than-left L4-5, and mild-to-moderate right L5-S1 osseous neuroforaminal stenosis. 3. Multilevel central canal stenosis as above mild-to-moderate at L2-3 and moderate L3-4 and L4-5. Electronically Signed   By: Yvonne Kendall M.D.   On: 05/01/2022 21:11   CT Hip Right Wo Contrast  Result Date: 05/01/2022 CLINICAL DATA:  Irregularity of right superior pubic ramus on recent plain film EXAM: CT OF THE RIGHT HIP WITHOUT CONTRAST TECHNIQUE: Multidetector CT imaging of the right hip was performed according to the standard protocol. Multiplanar CT image reconstructions were also generated. RADIATION DOSE REDUCTION: This exam was performed according to the departmental dose-optimization program which includes automated exposure control, adjustment of the mA and/or kV according to patient size and/or use of iterative reconstruction technique. COMPARISON:  Plain film from earlier in the same day. FINDINGS: Bones/Joint/Cartilage Diffuse osteopenia is noted. No acute fracture is identified. No dislocation is seen. The superior pubic ramus on the right appears intact. The area of abnormality seen on recent plain film is not borne out this exam. Ligaments Suboptimally assessed by CT. Muscles and Tendons Surrounding  musculature appears within normal limits with the exception of some fatty replacement of the right gluteus minimus. Soft tissues No evidence of joint effusion is seen. No soft tissue abnormality is noted. Diffuse vascular calcifications are seen. IMPRESSION: No acute fracture is identified. The area of abnormality in the superior pubic ramus on the right is not borne out on this exam. Mild muscular atrophy with fatty replacement involving the gluteus minimus on the right. Electronically Signed   By: Inez Catalina M.D.   On: 05/01/2022 19:55   DG Hip Unilat W or Wo Pelvis 2-3 Views Right  Result Date: 05/01/2022 CLINICAL DATA:  A 88 year old male presents with concern for hip fracture. EXAM: DG HIP (WITH OR WITHOUT PELVIS) 2-3V RIGHT COMPARISON:  March 11, 2015 and November of 2018. FINDINGS: Osteopenia. RIGHT hip is located.  No signs of RIGHT hip fracture. Mild irregularity of the superior pubic ramus on the RIGHT. No definite irregularity of the inferior pubic ramus. Signs of LEFT total hip arthroplasty. Stool and gas overlies the iliac crest bilaterally. Slightly nonstandard positioning with rotation of the pelvis does limit assessment of the RIGHT iliac. IMPRESSION: 1. Mild irregularity of the superior pubic ramus on the RIGHT. No definite irregularity of the inferior pubic ramus. Difficult to exclude a subtle nondisplaced fracture or developing insufficiency changes. 2. Osteopenia. 3. Signs of LEFT total hip arthroplasty. Electronically Signed   By: Zetta Bills M.D.   On: 05/01/2022 14:58    EKG: I independently viewed the EKG done and my findings are  as followed: Atrial fibrillation rate of 63.  Nonspecific ST changes.  QTc 628.  Assessment/Plan Present on Admission:  Acetabular fracture ([place])  Principal Problem:   Acetabular fracture ([place])  Acetabular fracture, POA Orthopedic surgery recommended nonoperative management Nonweightbearing PT OT assessment with orthopedic surgery's  guidance Analgesics as needed with bowel regimen as needed Fall precautions  Permanent A-fib, rate controlled On Coumadin for CVA prevention Pharmacy consulted to assist with Coumadin dosing Monitor on telemetry  Supratherapeutic INR INR 4.1 Hold off home Coumadin Appreciate pharmacist assistance  CKD 3A Baseline creatinine appears to be 1.1 with GFR 58. Presented with creatinine of 1.33 with GFR 49 Avoid nephrotoxic agents, dehydration and hypotension. Monitor urine output with strict I's and O's Repeat renal panel in the morning.    DVT prophylaxis: Supratherapeutic INR hold Coumadin.  Code Status: Full code  Family Communication: None at bedside  Disposition Plan: Admitted to telemetry surgical unit.  Consults called: Orthopedic surgery consulted by EDP.  Admission status: Inpatient status.   Status is: Inpatient The patient requires at least 2 midnights for further evaluation and treatment of present condition.   Kayleen Memos MD [facility] Pager [phone]  If 7PM-7AM, please contact night-coverage [email or web address] Password [facility]  05/02/2022, 6:29 AM

## 2022-05-02 NOTE — ED Notes (Signed)
Patient taken to MRI at this time 

## 2022-05-02 NOTE — ED Notes (Signed)
Patient agitated about length of wait and "how long everything is taking.  You need to speed up."  Patient updated on plan of care and reassurance given about current plan

## 2022-05-02 NOTE — ED Notes (Signed)
Patient returned from MRI at this time.  

## 2022-05-02 NOTE — Evaluation (Signed)
Physical Therapy Evaluation Patient Details Name: [name] MRN: [id number] DOB: 20-May-1923 Today's Date: 05/02/2022  History of Present Illness  Pt is a 88 y/o male admitted on 6/20 secondary to increased R hip pain. Pt with R acetabular fx, but reported no hx of falls. PMH includes a fib, HTN, and RA.  Clinical Impression  Pt admitted secondary to problem above with deficits below. Pt requiring mod A +2 for transfers this session, but unable to maintain appropriate weightbearing. Pt previously using RW for mobility. Feel pt is at increased risk for falls and given new deficits, recommending SNF level therapies at d/c. Will continue to follow acutely.      Recommendations for follow up therapy are one component of a multi-disciplinary discharge planning process, led by the attending physician.  Recommendations may be updated based on patient status, additional functional criteria and insurance authorization.  Follow Up Recommendations Skilled nursing-short term rehab (<3 hours/day) Can patient physically be transported by private vehicle: No    Assistance Recommended at Discharge Frequent or constant Supervision/Assistance  Patient can return home with the following  Two people to help with walking and/or transfers;Two people to help with bathing/dressing/bathroom;Assistance with cooking/housework;Help with stairs or ramp for entrance;Assist for transportation    Equipment Recommendations Wheelchair (measurements PT);Wheelchair cushion (measurements PT)  Recommendations for Other Services       Functional Status Assessment Patient has had a recent decline in their functional status and demonstrates the ability to make significant improvements in function in a reasonable and predictable amount of time.     Precautions / Restrictions Precautions Precautions: Fall Precaution Comments: Reports no hx of falls, but did report he "fell" into wall in the bathroom where he sustained chest  and L arm bruising. Restrictions Weight Bearing Restrictions: Yes RLE Weight Bearing: Touchdown weight bearing      Mobility  Bed Mobility Overal bed mobility: Needs Assistance Bed Mobility: Supine to Sit, Sit to Supine     Supine to sit: Min assist, +2 for physical assistance Sit to supine: Min assist, +2 for physical assistance   General bed mobility comments: Assist for trunk and LE assist.    Transfers Overall transfer level: Needs assistance Equipment used: Rolling walker (2 wheels) Transfers: Sit to/from Stand, Bed to chair/wheelchair/BSC Sit to Stand: Mod assist, +2 physical assistance          Lateral/Scoot Transfers: Mod assist, +2 physical assistance General transfer comment: Mod A +2 to stand. Pt with difficulty maintaining TDWB in standing, so returned to sitting. Performed lateral scoot along EOB with mod A +2 for repositioning.    Ambulation/Gait                  Stairs            Wheelchair Mobility    Modified Rankin (Stroke Patients Only)       Balance Overall balance assessment: Needs assistance Sitting-balance support: No upper extremity supported, Feet supported Sitting balance-Leahy Scale: Fair     Standing balance support: Bilateral upper extremity supported Standing balance-Leahy Scale: Poor Standing balance comment: Reliant on BUE and external support                             Pertinent Vitals/Pain Pain Assessment Pain Assessment: Faces Faces Pain Scale: Hurts little more Pain Location: R hip Pain Descriptors / Indicators: Guarding Pain Intervention(s): Limited activity within patient's tolerance, Monitored during session, Repositioned    Home  Living Family/patient expects to be discharged to:: Private residence Living Arrangements: Spouse/significant other Available Help at Discharge: Family;Available 24 hours/day (reports son and daughter in law) Type of Home: House Home Access: Ramped entrance        Home Layout: One level Home Equipment: Tub bench;Grab bars - tub/shower;Grab bars - toilet;Rolling Walker (2 wheels);BSC/3in1      Prior Function Prior Level of Function : Needs assist             Mobility Comments: Uses RW normally for ambulation ADLs Comments: Son assists with bathing/dressing     Hand Dominance        Extremity/Trunk Assessment   Upper Extremity Assessment Upper Extremity Assessment: Defer to OT evaluation    Lower Extremity Assessment Lower Extremity Assessment: RLE deficits/detail RLE Deficits / Details: Pt able to perform hip flexion and knee flexion.    Cervical / Trunk Assessment Cervical / Trunk Assessment: Kyphotic;Other exceptions Cervical / Trunk Exceptions: Bruising on L side of pt's chest  Communication   Communication: HOH  Cognition Arousal/Alertness: Awake/alert Behavior During Therapy: Impulsive Overall Cognitive Status: No family/caregiver present to determine baseline cognitive functioning                                 General Comments: Pt slightly impulsive with decreased safety awareness. Required safety cues throughout.        General Comments      Exercises     Assessment/Plan    PT Assessment Patient needs continued PT services  PT Problem List Decreased strength;Decreased activity tolerance;Decreased balance;Decreased mobility;Decreased knowledge of use of DME;Decreased knowledge of precautions;Decreased safety awareness       PT Treatment Interventions DME instruction;Gait training;Stair training;Functional mobility training;Therapeutic activities;Therapeutic exercise;Balance training;Patient/family education    PT Goals (Current goals can be found in the Care Plan section)  Acute Rehab PT Goals Patient Stated Goal: to get better PT Goal Formulation: With patient Time For Goal Achievement: 05/16/22 Potential to Achieve Goals: Good    Frequency Min 3X/week     Co-evaluation                AM-PAC PT "6 Clicks" Mobility  Outcome Measure Help needed turning from your back to your side while in a flat bed without using bedrails?: A Little Help needed moving from lying on your back to sitting on the side of a flat bed without using bedrails?: A Lot Help needed moving to and from a bed to a chair (including a wheelchair)?: Total Help needed standing up from a chair using your arms (e.g., wheelchair or bedside chair)?: Total Help needed to walk in hospital room?: Total Help needed climbing 3-5 steps with a railing? : Total 6 Click Score: 9    End of Session Equipment Utilized During Treatment: Gait belt Activity Tolerance: Patient tolerated treatment well Patient left: in bed;with call bell/phone within reach (on stretcher in ED) Nurse Communication: Mobility status PT Visit Diagnosis: Unsteadiness on feet (R26.81);Muscle weakness (generalized) (M62.81);Difficulty in walking, not elsewhere classified (R26.2);Pain Pain - Right/Left: Right Pain - part of body:  (acetabulum)    Time: 2353-6144 PT Time Calculation (min) (ACUTE ONLY): 19 min   Charges:   PT Evaluation $PT Eval Moderate Complexity: 1 Mod          Reuel Derby, PT, DPT  Acute Rehabilitation Services  Office: [phone]   Rudean Hitt 05/02/2022, 11:34 AM

## 2022-05-02 NOTE — Progress Notes (Signed)
Subjective:  Patient admitted this morning, see detailed H&P by Dr. Nevada Crane 88 year old male with medical history of pulmonary atrial fibrillation on Coumadin, rheumatoid arthritis, GERD, hypertension presented with difficulty walking right hip pain.  At baseline patient is able to ambulate with walker.  Denies any falls.  Patient was transferred to [facility].  MRI of lumbar spine and right hip was performed.  Which showed acetabular fracture.  Orthopedics was consulted they recommended nonoperative management.  Patient also complains of bruise on left chest wall which she sustained in the bathroom while getting up from toilet seat.  Though he denies falling.  Vitals:   05/02/22 1100 05/02/22 1145  BP: 136/86 140/86  Pulse: (!) 101 100  Resp: 20 (!) 22  Temp:    SpO2: 97% 97%      A/P Acetabular fracture -Orthopedics was consulted, recommended nonoperative management -TDWB right lower extremity -Follow-up Dr. Alvan Dame in 3 weeks -PT recommends going to skilled facility for rehab  Permanent atrial fibrillation, heart rate controlled -Continue Coumadin  Supratherapeutic INR -INR elevated at 4.1 -Coumadin held -Pharmacy managing dosing of Coumadin  CKD stage III a -Baseline creatinine 1.1 -Presented with creatinine of 1.33 -This morning creatinine is up to 1.18  Macrocytic anemia -Hemoglobin 8.3; MCV 101.2 -Obtain B12 and folate   [place] [facility]

## 2022-05-02 NOTE — ED Notes (Signed)
Patient moved to room for privacy.  Assisted to use urinal

## 2022-05-02 NOTE — Progress Notes (Signed)
ANTICOAGULATION CONSULT NOTE - Initial Consult  Pharmacy Consult for Warfarin  Indication: atrial fibrillation  Allergies  Allergen Reactions   Shellfish Allergy Anaphylaxis   Sulfa Antibiotics Anaphylaxis   Fish Allergy     Vital Signs: Temp: 98.3 F (36.8 C) (06/20 2308) Temp Source: Oral (06/20 2308) BP: 142/83 (06/21 0310) Pulse Rate: 97 (06/21 0310)  Labs: Recent Labs    05/01/22 1450 05/02/22 0255  HGB 9.2*  --   HCT 28.5*  --   PLT 211  --   LABPROT  --  39.1*  INR  --  4.1*  CREATININE 1.33*  --     CrCl cannot be calculated (Unknown ideal weight.).   Medical History: Past Medical History:  Diagnosis Date   A-fib [facility])    Dysrhythmia    Atrial Tachycardia with block   Erosive esophagitis    GERD (gastroesophageal reflux disease)    H/O fracture of rib    Left  5th, 6th, 7th had a fall in APRIL 2016   Hypertension    RA (rheumatoid arthritis) ([place])      Assessment: 88 y/o M on warfarin PTA for afib presents to the ED with weakness. Continuing warfarin. INR is supra-therapeutic at 4.1. Other labs above.   Goal of Therapy:  INR 2-3 Monitor platelets by anticoagulation protocol: Yes   Plan:  No warfarin today Daily PT/INR Resume warfarin as INR allows  Narda Bonds, PharmD, BCPS Clinical Pharmacist Phone: [phone]

## 2022-05-02 NOTE — ED Notes (Signed)
Owais (Son) would like an update on the pt whenever you get a chance. Phone is [phone].

## 2022-05-03 DIAGNOSIS — R791 Abnormal coagulation profile: Secondary | ICD-10-CM

## 2022-05-03 DIAGNOSIS — S32401K Unspecified fracture of right acetabulum, subsequent encounter for fracture with nonunion: Secondary | ICD-10-CM | POA: Diagnosis not present

## 2022-05-03 DIAGNOSIS — N1831 Chronic kidney disease, stage 3a: Secondary | ICD-10-CM

## 2022-05-03 DIAGNOSIS — D649 Anemia, unspecified: Secondary | ICD-10-CM | POA: Diagnosis not present

## 2022-05-03 DIAGNOSIS — I482 Chronic atrial fibrillation, unspecified: Secondary | ICD-10-CM | POA: Diagnosis not present

## 2022-05-03 LAB — PROTIME-INR
INR: 3.7 — ABNORMAL HIGH (ref 0.8–1.2)
Prothrombin Time: 36.4 seconds — ABNORMAL HIGH (ref 11.4–15.2)

## 2022-05-03 LAB — CBC WITH DIFFERENTIAL/PLATELET
Abs Immature Granulocytes: 0.06 10*3/uL (ref 0.00–0.07)
Basophils Absolute: 0 10*3/uL (ref 0.0–0.1)
Basophils Relative: 0 %
Eosinophils Absolute: 0.1 10*3/uL (ref 0.0–0.5)
Eosinophils Relative: 1 %
HCT: 21 % — ABNORMAL LOW (ref 39.0–52.0)
Hemoglobin: 6.9 g/dL — CL (ref 13.0–17.0)
Immature Granulocytes: 1 %
Lymphocytes Relative: 7 %
Lymphs Abs: 0.5 10*3/uL — ABNORMAL LOW (ref 0.7–4.0)
MCH: 33.5 pg (ref 26.0–34.0)
MCHC: 32.9 g/dL (ref 30.0–36.0)
MCV: 101.9 fL — ABNORMAL HIGH (ref 80.0–100.0)
Monocytes Absolute: 0.8 10*3/uL (ref 0.1–1.0)
Monocytes Relative: 11 %
Neutro Abs: 6.2 10*3/uL (ref 1.7–7.7)
Neutrophils Relative %: 80 %
Platelets: 205 10*3/uL (ref 150–400)
RBC: 2.06 MIL/uL — ABNORMAL LOW (ref 4.22–5.81)
RDW: 15.8 % — ABNORMAL HIGH (ref 11.5–15.5)
WBC: 7.6 10*3/uL (ref 4.0–10.5)
nRBC: 0 % (ref 0.0–0.2)

## 2022-05-03 LAB — PREPARE RBC (CROSSMATCH)

## 2022-05-03 LAB — BASIC METABOLIC PANEL
Anion gap: 7 (ref 5–15)
BUN: 31 mg/dL — ABNORMAL HIGH (ref 8–23)
CO2: 22 mmol/L (ref 22–32)
Calcium: 8.1 mg/dL — ABNORMAL LOW (ref 8.9–10.3)
Chloride: 108 mmol/L (ref 98–111)
Creatinine, Ser: 1.48 mg/dL — ABNORMAL HIGH (ref 0.61–1.24)
GFR, Estimated: 43 mL/min — ABNORMAL LOW (ref >60)
Glucose, Bld: 153 mg/dL — ABNORMAL HIGH (ref 70–99)
Potassium: 4.5 mmol/L (ref 3.5–5.1)
Sodium: 137 mmol/L (ref 135–145)

## 2022-05-03 LAB — FERRITIN: Ferritin: 321 ng/mL (ref 24–336)

## 2022-05-03 LAB — IRON AND TIBC
Iron: 18 ug/dL — ABNORMAL LOW (ref 45–182)
Saturation Ratios: 11 % — ABNORMAL LOW (ref 17.9–39.5)
TIBC: 162 ug/dL — ABNORMAL LOW (ref 250–450)
UIBC: 144 ug/dL

## 2022-05-03 LAB — OCCULT BLOOD X 1 CARD TO LAB, STOOL: Fecal Occult Bld: NEGATIVE

## 2022-05-03 MED ORDER — ACETAMINOPHEN 325 MG PO TABS
650.0000 mg | ORAL_TABLET | Freq: Once | ORAL | Status: AC
  Administered 2022-05-03: 650 mg via ORAL
  Filled 2022-05-03: qty 2

## 2022-05-03 MED ORDER — FERROUS SULFATE 325 (65 FE) MG PO TABS
325.0000 mg | ORAL_TABLET | Freq: Three times a day (TID) | ORAL | Status: DC
  Administered 2022-05-03 – 2022-05-05 (×6): 325 mg via ORAL
  Filled 2022-05-03 (×8): qty 1

## 2022-05-03 MED ORDER — SODIUM CHLORIDE 0.9% IV SOLUTION
Freq: Once | INTRAVENOUS | Status: AC
Start: 2022-05-03 — End: 2022-05-03

## 2022-05-03 MED ORDER — FUROSEMIDE 10 MG/ML IJ SOLN
20.0000 mg | Freq: Once | INTRAMUSCULAR | Status: AC
  Administered 2022-05-03: 20 mg via INTRAVENOUS
  Filled 2022-05-03: qty 2

## 2022-05-03 MED ORDER — TIMOLOL MALEATE 0.5 % OP SOLN
1.0000 [drp] | Freq: Every morning | OPHTHALMIC | Status: DC
Start: 2022-05-03 — End: 2022-05-05
  Administered 2022-05-03 – 2022-05-05 (×3): 1 [drp] via OPHTHALMIC
  Filled 2022-05-03 (×2): qty 5

## 2022-05-03 MED ORDER — METOPROLOL SUCCINATE ER 25 MG PO TB24
50.0000 mg | ORAL_TABLET | Freq: Every day | ORAL | Status: DC
  Administered 2022-05-03 – 2022-05-05 (×3): 50 mg via ORAL
  Filled 2022-05-03 (×3): qty 2

## 2022-05-03 MED ORDER — PANTOPRAZOLE SODIUM 40 MG PO TBEC
40.0000 mg | DELAYED_RELEASE_TABLET | Freq: Two times a day (BID) | ORAL | Status: DC
  Administered 2022-05-03 – 2022-05-05 (×4): 40 mg via ORAL
  Filled 2022-05-03 (×5): qty 1

## 2022-05-03 NOTE — Progress Notes (Signed)
Per CCMD, patient had 4 beats of V-Tach. Focused assessement WNL.

## 2022-05-03 NOTE — Progress Notes (Signed)
IV team to bedside per consult. Current IV infusing blood with no issues at this time. Secure chat sent to RN and spoke w/ nursing student present in room, will place another consult if additional IV access is needed in the future.

## 2022-05-03 NOTE — Progress Notes (Signed)
PROGRESS NOTE    [name]  ZOX:[id number] DOB: 15-Sep-1923 DOA: 05/01/2022 PCP: Merrilee Seashore, MD    Chief Complaint  Patient presents with   Weakness    Brief Narrative:  Patient admitted, see detailed H&P by Dr. Nevada Crane 88 year old male with medical history of pulmonary atrial fibrillation on Coumadin, rheumatoid arthritis, GERD, hypertension presented with difficulty walking right hip pain.  At baseline patient is able to ambulate with walker.  Denies any falls.  Patient was transferred to [facility].  MRI of lumbar spine and right hip was performed.  Which showed acetabular fracture.  Orthopedics was consulted they recommended nonoperative management.  Patient also complains of bruise on left chest wall which she sustained in the bathroom while getting up from toilet seat.  Though he denies falling.  Hemoglobin trickled down to 6.9 and patient being transfused 2 units packed red blood cells 05/03/2022.   Assessment & Plan:   Principal Problem:   Acetabular fracture ([place]) Active Problems:   Atrial fibrillation, chronic ([facility])   Supratherapeutic INR   Stage 3a chronic kidney disease ([place])  #1 acetabular fracture -Patient presented with acetabular fracture. -Orthopedic consultation obtained who are recommending nonoperative management at this time with nonweightbearing. -PT OT pending. -Pain management, supportive care. -Fall precautions.  2.  Permanent A-fib, rate control/supratherapeutic INR. -Continue home regimen diltiazem for rate control.  Resume home regimen Toprol-XL. -INR supratherapeutic and as such Coumadin on hold. -Coumadin per pharmacy.  3.  Anemia of chronic disease/macrocytic anemia -Patient presented anemia with a decrease in hemoglobin likely partial dilutional secondary to hematoma noted on upper chest wall. -Patient denies any overt bleeding and per nursing student patient with brown loose stools today. -Anemia panel consistent with anemia of chronic  disease. -Hemoglobin at 6.9 from 9.2 on admission. -FOBT. -Transfuse 2 units packed red blood cells. -Follow H&H. -Transfusion threshold hemoglobin < 8.  4. supratherapeutic INR -INR noted to be 4.1 on admission. -INR trending down currently at 3.7 -Continue to hold Coumadin. -Repeat labs in the AM.  5.  CKD stage IIIA -Baseline creatinine approximately 1.1. -Patient presented with a creatinine 1.33. -Avoid nephrotoxic agents, hypotension, dehydration. -Urine output not properly recorded. -Creatinine currently at 1.48 from 1.18 from 1.33 on admission. -Patient to be transfused today. -Repeat labs in the AM.    DVT prophylaxis: Supratherapeutic INR/Coumadin Code Status: Full Family Communication: Updated patient.  No family at bedside. Disposition: TBD  Status is: Inpatient Remains inpatient appropriate because: Severity of illness/unsafe disposition.   Consultants:  Orthopedics: Hilbert Odor, PA 05/02/2022  Procedures:  Transfusion 2 units packed red blood cells 05/03/2022 CT right hip 05/01/2022 CT L-spine 05/01/2022 Plain films of the right hip and pelvis 05/01/2022 MRI right hip 05/02/2022 MRI L-spine 05/02/2022   Antimicrobials:  None   Subjective: Sitting up in bed.  Denies any overt bleeding.  Left chest wall pain improved since admission however still with some pain to palpation.  No shortness of breath.  No abdominal pain.  States he is eating like a horse.  Patient denies any overt bleeding.  Objective: Vitals:   05/03/22 1451 05/03/22 1527 05/03/22 1724 05/03/22 1754  BP: (!) 100/45 (!) 106/56 (!) 105/55 (!) 99/53  Pulse: 64 68 67 66  Resp: '18 18 16 16  '$ Temp: 98.8 F (37.1 C) 98 F (36.7 C) 98.2 F (36.8 C) 98.3 F (36.8 C)  TempSrc: Oral Oral Oral Oral  SpO2: 92% 99% 90%     Intake/Output Summary (Last 24 hours) at 05/03/2022  1802 Last data filed at 05/03/2022 1724 Gross per 24 hour  Intake 775 ml  Output 225 ml  Net 550 ml   There were no  vitals filed for this visit.  Examination:  General exam: Appears calm and comfortable  Respiratory system: Clear to auscultation.  No wheezes, no crackles, no rhonchi.  Respiratory effort normal. Cardiovascular system: Irregularly irregular. No JVD, murmurs, rubs, gallops or clicks. No pedal edema.  Large ecchymotic region noted on left upper chest wall and left upper extremity. Gastrointestinal system: Abdomen is nondistended, soft and nontender. No organomegaly or masses felt. Normal bowel sounds heard. Central nervous system: Alert and oriented. No focal neurological deficits. Extremities: Symmetric 5 x 5 power. Skin: No rashes, lesions or ulcers Psychiatry: Judgement and insight appear normal. Mood & affect appropriate.     Data Reviewed: I have personally reviewed following labs and imaging studies  CBC: Recent Labs  Lab 05/01/22 1450 05/02/22 0725 05/03/22 0926  WBC 8.5 6.5 7.6  NEUTROABS  --  5.0 6.2  HGB 9.2* 8.3* 6.9*  HCT 28.5* 24.9* 21.0*  MCV 99.3 101.2* 101.9*  PLT 211 191 213    Basic Metabolic Panel: Recent Labs  Lab 05/01/22 1450 05/02/22 0725 05/03/22 0926  NA 138 138 137  K 4.7 4.4 4.5  CL 106 110 108  CO2 '23 22 22  '$ GLUCOSE 111* 95 153*  BUN 27* 23 31*  CREATININE 1.33* 1.18 1.48*  CALCIUM 8.9 8.2* 8.1*  MG  --  2.0  --   PHOS  --  2.9  --     GFR: CrCl cannot be calculated (Unknown ideal weight.).  Liver Function Tests: Recent Labs  Lab 05/02/22 0725  AST 19  ALT 13  ALKPHOS 87  BILITOT 0.9  PROT 5.6*  ALBUMIN 2.6*    CBG: Recent Labs  Lab 05/01/22 1430  GLUCAP 110*     No results found for this or any previous visit (from the past 240 hour(s)).       Radiology Studies: MR HIP RIGHT WO CONTRAST  Result Date: 05/02/2022 CLINICAL DATA:  Indeterminate CT for hip fracture. MRI was performed for further characterization of persistent pain. EXAM: MR OF THE RIGHT HIP WITHOUT CONTRAST TECHNIQUE: Multiplanar, multisequence MR  imaging was performed. No intravenous contrast was administered. COMPARISON:  CT right hip 05/01/2022 FINDINGS: Bones: Left hip arthroplasty with susceptibility artifact partially obscuring the adjacent soft tissue and osseous structures. No periarticular fluid collection or osteolysis. No proximal right femoral fracture. Bone marrow edema in the right superior acetabulum with a subtle linear component consistent with a nondisplaced insufficiency fracture. No periosteal reaction or bone destruction. No aggressive osseous lesion. Mild osteoarthritis of bilateral SI joints. Degenerative disease with severe disc height loss at L3-4, L4-5 and L5-S1. Articular cartilage and labrum Articular cartilage: Partial-thickness cartilage loss of the right femoral head and acetabulum. Labrum: Right labral degeneration with a degenerative tear of the superior labrum. Joint or bursal effusion Joint effusion:  No hip joint effusion.  No SI joint effusion. Bursae: Small amount of fluid in the right greater trochanteric bursa. Muscles and tendons Flexors: Small amount of perifascial fluid deep to the iliacus muscle bilaterally. Extensors: Normal. Abductors: Normal. Adductors: Normal. Gluteals: Partial-thickness tear of the right gluteus minimus tendon insertion. Mild muscle edema in the right gluteus minimus muscle consistent with muscle strain. Hamstrings: Partial-thickness tear of the right hamstring origin. Other findings No pelvic free fluid. No fluid collection or hematoma. No inguinal lymphadenopathy. No inguinal hernia.  Severely distended bladder. IMPRESSION: 1. Nondisplaced right superior acetabular insufficiency fracture with surrounding marrow edema. 2. Partial-thickness tear of the right gluteus minimus tendon insertion. Mild muscle edema in the right gluteus minimus muscle consistent with muscle strain. Electronically Signed   By: Kathreen Devoid M.D.   On: 05/02/2022 06:45   MR LUMBAR SPINE WO CONTRAST  Result Date:  05/02/2022 CLINICAL DATA:  Low back pain, cauda equina syndrome suspected EXAM: MRI LUMBAR SPINE WITHOUT CONTRAST TECHNIQUE: Multiplanar, multisequence MR imaging of the lumbar spine was performed. No intravenous contrast was administered. COMPARISON:  No prior MRI of the lumbar spine, correlation is made with CT lumbar spine 05/01/2022 FINDINGS: Segmentation:  Standard. Alignment: Scoliosis, with dextrocurvature of the lumbar spine. Mild retrolisthesis of L3 on L4. Vertebrae:  No acute fracture or suspicious osseous lesion. Conus medullaris and cauda equina: Conus extends to the L1-L2 level. Conus and cauda equina appear normal. Paraspinal and other soft tissues: Large left renal cyst. Disc levels: T12-L1: Severe disc height loss with minimal disc bulge. Mild facet arthropathy. No spinal canal stenosis or neural foraminal narrowing. L1-L2: Severe disc height loss with small disc osteophyte complex. Mild facet arthropathy. Narrowing of the left lateral recess. No spinal canal stenosis or neural foraminal narrowing. L2-L3: Disc height loss with left eccentric disc osteophyte complex and right eccentric disc bulge. Mild facet arthropathy. Narrowing of the lateral recesses. Mild spinal canal stenosis. Mild left neural foraminal narrowing. L3-L4: Disc height loss with disc osteophyte complex moderate disc bulge, and mild retrolisthesis. Moderate facet arthropathy. Moderate spinal canal stenosis. Narrowing of the lateral recesses. Mild-to-moderate left and mild right neural foraminal narrowing. L4-L5: Disc height loss with disc osteophyte complex and minimal disc bulge. Severe left and moderate right facet arthropathy. Narrowing of the lateral recesses. No spinal canal stenosis. Mild bilateral neural foraminal narrowing. L5-S1: Disc height loss with disc osteophyte complex. Moderate facet arthropathy. No spinal canal stenosis. Moderate right neural foraminal narrowing. IMPRESSION: 1. L3-L4 moderate spinal canal stenosis,  with mild-to-moderate left and mild right neural foraminal narrowing. 2. L2-L3 mild spinal canal stenosis and mild left neural foraminal narrowing. 3. L4-L5 mild bilateral neural foraminal narrowing. 4. L5-S1 moderate right neural foraminal narrowing. 5. Narrowing of the lateral recesses at L1-L2, L2-L3, L3-L4, and L4-L5, which could affect the descending L2, L3, L4, and L5 nerves, respectively. Electronically Signed   By: Merilyn Baba M.D.   On: 05/02/2022 02:45   CT Lumbar Spine Wo Contrast  Result Date: 05/01/2022 CLINICAL DATA:  Lower back pain. Cauda equina syndrome suspected. Right hip/lower back pain with right leg weakness. Unable to walk with a walker today. EXAM: CT LUMBAR SPINE WITHOUT CONTRAST TECHNIQUE: Multidetector CT imaging of the lumbar spine was performed without intravenous contrast administration. Multiplanar CT image reconstructions were also generated. RADIATION DOSE REDUCTION: This exam was performed according to the departmental dose-optimization program which includes automated exposure control, adjustment of the mA and/or kV according to patient size and/or use of iterative reconstruction technique. COMPARISON:  Pelvis and right hip radiographs 05/01/2022 FINDINGS: Segmentation: Standard. Alignment: There is moderate dextrocurvature centered at L2. Mild 2 mm retrolisthesis of L4 on L5. Vertebrae: Vertebral body heights are maintained. Severe multilevel disc space narrowing, greatest within the anterior right T12-L1 left L1-2, mid to left L2-3, right L3-4, diffuse L4-5, and right L5-S1 levels. Degenerative vacuum phenomenon at the T10-11 and L3-4 through L5-S1 levels. Paraspinal and other soft tissues: The lung bases are minimally partially visualized. There appear to be right-greater-than-left pleural effusions.  Moderate to high-grade atherosclerotic calcifications within the partially visualized aorta and iliac arteries. Disc levels: T11-12: Moderate right-greater-than-left facet  joint hypertrophy. Scoliotic curvature. Moderate left osseous neuroforaminal stenosis. Moderate narrowing of the lateral recesses and borderline mild central canal stenosis. T12-L1: Moderate bilateral facet joint hypertrophy. Mild left osseous neuroforaminal stenosis. L1-2: Moderate bilateral facet joint hypertrophy. Mild posterior endplate ridging. Mild left neuroforaminal stenosis. Borderline mild central canal stenosis. L2-3: Moderate bilateral facet joint hypertrophy. Left-greater-than-right posterior disc osteophyte complex. Moderate left neuroforaminal stenosis. Moderate narrowing of the lateral recesses and mild-to-moderate central canal stenosis. L3-4: Moderate to severe bilateral facet joint hypertrophy. Moderate broad-based posterior disc osteophyte complex with bilateral intraforaminal extension. Moderate left and mild-to-moderate right neuroforaminal stenosis. Severe right lateral recess stenosis. Likely moderate central canal stenosis. L4-5: Moderate bilateral facet joint hypertrophy. Mild-to-moderate broad-based posterior disc osteophyte complex. Mild right-greater-than-left neuroforaminal stenosis. Moderate narrowing of the lateral recesses and moderate central canal stenosis. L5-S1: Moderate right and mild left facet joint hypertrophy. Mild-to-moderate broad-based posterior disc osteophyte complex with right intraforaminal extension. Mild-to-moderate right neuroforaminal stenosis. Mild narrowing of lateral recesses. No significant central canal stenosis. IMPRESSION: 1. Moderate to severe multilevel degenerative disc and joint changes as above. 2. Multilevel neuroforaminal stenosis including moderate left L2-3, moderate left and mild-to-moderate right L3-4, mild right-greater-than-left L4-5, and mild-to-moderate right L5-S1 osseous neuroforaminal stenosis. 3. Multilevel central canal stenosis as above mild-to-moderate at L2-3 and moderate L3-4 and L4-5. Electronically Signed   By: Yvonne Kendall M.D.    On: 05/01/2022 21:11   CT Hip Right Wo Contrast  Result Date: 05/01/2022 CLINICAL DATA:  Irregularity of right superior pubic ramus on recent plain film EXAM: CT OF THE RIGHT HIP WITHOUT CONTRAST TECHNIQUE: Multidetector CT imaging of the right hip was performed according to the standard protocol. Multiplanar CT image reconstructions were also generated. RADIATION DOSE REDUCTION: This exam was performed according to the departmental dose-optimization program which includes automated exposure control, adjustment of the mA and/or kV according to patient size and/or use of iterative reconstruction technique. COMPARISON:  Plain film from earlier in the same day. FINDINGS: Bones/Joint/Cartilage Diffuse osteopenia is noted. No acute fracture is identified. No dislocation is seen. The superior pubic ramus on the right appears intact. The area of abnormality seen on recent plain film is not borne out this exam. Ligaments Suboptimally assessed by CT. Muscles and Tendons Surrounding musculature appears within normal limits with the exception of some fatty replacement of the right gluteus minimus. Soft tissues No evidence of joint effusion is seen. No soft tissue abnormality is noted. Diffuse vascular calcifications are seen. IMPRESSION: No acute fracture is identified. The area of abnormality in the superior pubic ramus on the right is not borne out on this exam. Mild muscular atrophy with fatty replacement involving the gluteus minimus on the right. Electronically Signed   By: Inez Catalina M.D.   On: 05/01/2022 19:55        Scheduled Meds:  diltiazem  360 mg Oral Daily   ferrous sulfate  325 mg Oral TID PC   metoprolol succinate  50 mg Oral Daily   multivitamin  1 tablet Oral Daily   omega-3 acid ethyl esters  1 g Oral BID   pantoprazole  40 mg Oral BID   timolol  1 drop Right Eye q morning   Warfarin - Pharmacist Dosing Inpatient   Does not apply q1600   Continuous Infusions:   LOS: 1 day    Time  spent: 40 minutes    Quillian Quince  [place] Silos, MD [facility]   To contact the attending provider between 7A-7P or the covering provider during after hours 7P-7A, please log into the web site [email or web address] and access using universal  password for that web site. If you do not have the password, please call the hospital operator.  05/03/2022, 6:02 PM

## 2022-05-03 NOTE — TOC Initial Note (Signed)
Transition of Care [facility]) - Initial/Assessment Note    Patient Details  Name: [name] MRN: [id number] Date of Birth: 1923-10-07  Transition of Care [facility]) CM/SW Contact:    Tresa Endo Phone Number: 05/03/2022, 4:02 PM  Clinical Narrative:                 CSW received SNF consult. CSW met with pt spouse, son, and daughter-in-law. CSW introduced self and explained role at the hospital. Pt reports that PTA the pt lived at home with spouse. PT reports pt needs modA+2 for transfers but is not able to maintain weightbearing.   CSW reviewed PT/OT recommendations for SNF. Pt's family shares that pt is in need of SNF before returning home. Pt gave CSW permission to fax out to facilities in the area. Pt spouse suggested [place] bc she and pt have both been in the past.   CSW will continue to follow.    Expected Discharge Plan: Skilled Nursing Facility Barriers to Discharge: Continued Medical Work up   Patient Goals and CMS Choice Patient states their goals for this hospitalization and ongoing recovery are:: Rehab CMS Medicare.gov Compare Post Acute Care list provided to:: Patient Choice offered to / list presented to : Patient  Expected Discharge Plan and Services Expected Discharge Plan: [employer] In-house Referral: Clinical Social Work   Post Acute Care Choice: [place] Living arrangements for the past 2 months: [place]                                      Prior Living Arrangements/Services Living arrangements for the past 2 months: [place] Lives with:: Significant Other Patient language and need for interpreter reviewed:: Yes Do you feel safe going back to the place where you live?: Yes      Need for Family Participation in Patient Care: Yes (Comment) Care giver support system in place?: Yes (comment)   Criminal Activity/Legal Involvement Pertinent to Current Situation/Hospitalization: No -  Comment as needed  Activities of Daily Living      Permission Sought/Granted Permission sought to share information with : Family Supports, Customer service manager Permission granted to share information with : Yes, Verbal Permission Granted  Share Information with NAME: Geneva, Pallas (Son)   [phone]  Permission granted to share info w AGENCY: SNF  Permission granted to share info w Relationship: Jill, Ruppe (Son)   [phone]  Permission granted to share info w Contact Information: Pedrohenrique, [place] (Son)   [phone]  Emotional Assessment Appearance:: Appears stated age Attitude/Demeanor/Rapport: Unable to Assess Affect (typically observed): Unable to Assess Orientation: : Oriented to Self, Oriented to Place, Oriented to  Time, Oriented to Situation Alcohol / Substance Use: Not Applicable Psych Involvement: No (comment)  Admission diagnosis:  Acetabular fracture ([place]) [S32.409A] Closed nondisplaced fracture of right acetabulum, unspecified portion of acetabulum, initial encounter [facility]) [S32.401A] Patient Active Problem List   Diagnosis Date Noted   Acetabular fracture ([place]) 05/02/2022   Senile purpura ([place]) 01/31/2022   Pedal edema 01/31/2022   Acquired thrombophilia ([place]) 01/31/2022   Skin lesion 11/15/2021   Pain due to onychomycosis of toenails of both feet 05/03/2021   Encounter for general adult medical examination without abnormal findings 02/22/2021   Fracture of unspecified phalanx of left little finger, initial encounter for closed fracture 02/22/2021   Iron deficiency anemia 02/22/2021   Long term (current) use of  anticoagulants 02/22/2021   Mixed hyperlipidemia 02/22/2021   Personal history of other diseases of the digestive system 02/22/2021   Primary osteoarthritis 02/22/2021   Rheumatoid arthritis ([place]) 02/22/2021   Pain in right knee 10/19/2019   Educated about COVID-19 virus infection 03/03/2019   Persistent atrial fibrillation ([place])  03/03/2019   Nonrheumatic aortic valve insufficiency 03/03/2019   History of hemiarthroplasty of left hip 10/03/2018   Erosive esophagitis 10/03/2017   Hypertension 10/03/2017   Closed left hip fracture ([place]) 09/28/2017   Dysphagia 05/04/2015   Stricture and stenosis of esophagus 05/04/2015   GI bleed 03/12/2015   Anemia 03/12/2015   Left rib fracture- 5th, 6th, 7th 03/12/2015   Hematemesis 03/12/2015   Hyponatremia 03/11/2015   UTI (lower urinary tract infection) 03/11/2015   A-fib ([place]) 03/11/2015   Weakness 03/11/2015   Scalp hematoma 03/11/2015   Warfarin-induced coagulopathy ([place]) 03/11/2015   PCP:  Merrilee Seashore, MD Pharmacy:   [id number], [place] [place] [address] Phone: [phone] Fax: [phone]  [id number] [place] [place] [id number]: [phone]: [phone]    Social Determinants of Health (SDOH) Interventions    Readmission Risk Interventions     No data to display

## 2022-05-03 NOTE — Progress Notes (Signed)
ANTICOAGULATION CONSULT NOTE  Pharmacy Consult for Warfarin  Indication: atrial fibrillation  Allergies  Allergen Reactions   Shellfish Allergy Anaphylaxis   Sulfa Antibiotics Anaphylaxis   Fish Allergy     Vital Signs: Temp: 98 F (36.7 C) (06/22 0732) Temp Source: Oral (06/22 0732) BP: 118/83 (06/22 0732) Pulse Rate: 102 (06/22 0732)  Labs: Recent Labs    05/01/22 1450 05/02/22 0255 05/02/22 0725 05/03/22 0055 05/03/22 0926  HGB 9.2*  --  8.3*  --  6.9*  HCT 28.5*  --  24.9*  --  21.0*  PLT 211  --  191  --  205  LABPROT  --  39.1*  --  36.4*  --   INR  --  4.1*  --  3.7*  --   CREATININE 1.33*  --  1.18  --  1.48*     CrCl cannot be calculated (Unknown ideal weight.).   Assessment: 88 y/o M on warfarin PTA for afib presents to the ED with weakness found to have a right acetabular fracture - plan is non operative management. Pharmacy consulted to manage warfarin inpatient.   INR down to 3.7 but remains supratherapeutic. No overt bleeding noted, bruise on chest wall noted, Hgb has trended down to 6.9 and 2 units RBC ordered.   Goal of Therapy:  INR 2-3 Monitor platelets by anticoagulation protocol: Yes   Plan:  No warfarin today Daily PT/INR Resume warfarin as INR allows Monitor for s/sx of bleeding  Thank you for involving pharmacy in this patient's care.  Renold Genta, PharmD, BCPS Clinical Pharmacist Clinical phone for 05/03/2022 until 3p is [phone] 05/03/2022 11:17 AM  **Pharmacist phone directory can be found on [email or web address] listed under [place]**

## 2022-05-04 DIAGNOSIS — D649 Anemia, unspecified: Secondary | ICD-10-CM | POA: Diagnosis not present

## 2022-05-04 DIAGNOSIS — I482 Chronic atrial fibrillation, unspecified: Secondary | ICD-10-CM | POA: Diagnosis not present

## 2022-05-04 DIAGNOSIS — R791 Abnormal coagulation profile: Secondary | ICD-10-CM | POA: Diagnosis not present

## 2022-05-04 DIAGNOSIS — S32401K Unspecified fracture of right acetabulum, subsequent encounter for fracture with nonunion: Secondary | ICD-10-CM | POA: Diagnosis not present

## 2022-05-04 LAB — BASIC METABOLIC PANEL
Anion gap: 8 (ref 5–15)
BUN: 34 mg/dL — ABNORMAL HIGH (ref 8–23)
CO2: 20 mmol/L — ABNORMAL LOW (ref 22–32)
Calcium: 7.8 mg/dL — ABNORMAL LOW (ref 8.9–10.3)
Chloride: 108 mmol/L (ref 98–111)
Creatinine, Ser: 1.55 mg/dL — ABNORMAL HIGH (ref 0.61–1.24)
GFR, Estimated: 40 mL/min — ABNORMAL LOW (ref >60)
Glucose, Bld: 150 mg/dL — ABNORMAL HIGH (ref 70–99)
Potassium: 4 mmol/L (ref 3.5–5.1)
Sodium: 136 mmol/L (ref 135–145)

## 2022-05-04 LAB — CBC
HCT: 25 % — ABNORMAL LOW (ref 39.0–52.0)
Hemoglobin: 8.5 g/dL — ABNORMAL LOW (ref 13.0–17.0)
MCH: 32.6 pg (ref 26.0–34.0)
MCHC: 34 g/dL (ref 30.0–36.0)
MCV: 95.8 fL (ref 80.0–100.0)
Platelets: 163 10*3/uL (ref 150–400)
RBC: 2.61 MIL/uL — ABNORMAL LOW (ref 4.22–5.81)
RDW: 16.7 % — ABNORMAL HIGH (ref 11.5–15.5)
WBC: 7.5 10*3/uL (ref 4.0–10.5)
nRBC: 0 % (ref 0.0–0.2)

## 2022-05-04 LAB — BPAM RBC
Blood Product Expiration Date: 202307222359
Blood Product Expiration Date: 202307262359
ISSUE DATE / TIME: 202306221431
ISSUE DATE / TIME: 202306221747
Unit Type and Rh: 9500
Unit Type and Rh: 9500

## 2022-05-04 LAB — TYPE AND SCREEN
ABO/RH(D): O NEG
Antibody Screen: NEGATIVE
Unit division: 0
Unit division: 0

## 2022-05-04 LAB — PROTIME-INR
INR: 2.4 — ABNORMAL HIGH (ref 0.8–1.2)
Prothrombin Time: 25.9 seconds — ABNORMAL HIGH (ref 11.4–15.2)

## 2022-05-04 LAB — MAGNESIUM: Magnesium: 1.8 mg/dL (ref 1.7–2.4)

## 2022-05-04 MED ORDER — SODIUM CHLORIDE 0.9 % IV SOLN
INTRAVENOUS | Status: DC
Start: 2022-05-04 — End: 2022-05-05

## 2022-05-04 MED ORDER — LATANOPROST 0.005 % OP SOLN
1.0000 [drp] | Freq: Every day | OPHTHALMIC | Status: DC
Start: 2022-05-04 — End: 2022-05-05
  Administered 2022-05-04: 1 [drp] via OPHTHALMIC
  Filled 2022-05-04: qty 2.5

## 2022-05-04 MED ORDER — WARFARIN SODIUM 1 MG PO TABS
1.0000 mg | ORAL_TABLET | Freq: Once | ORAL | Status: AC
Start: 2022-05-04 — End: 2022-05-04
  Administered 2022-05-04: 1 mg via ORAL
  Filled 2022-05-04: qty 1

## 2022-05-04 MED ORDER — MAGNESIUM SULFATE 2 GM/50ML IV SOLN
2.0000 g | Freq: Once | INTRAVENOUS | Status: AC
Start: 2022-05-04 — End: 2022-05-04
  Administered 2022-05-04: 2 g via INTRAVENOUS
  Filled 2022-05-04: qty 50

## 2022-05-04 NOTE — Evaluation (Signed)
Occupational Therapy Evaluation Patient Details Name: [name] MRN: [id number] DOB: 11-24-1922 Today's Date: 05/04/2022   History of Present Illness Pt is a 88 y/o male admitted on 6/20 secondary to increased R hip pain. Pt with R acetabular fx, but reported no hx of falls. PMH includes a fib, HTN, and RA.   Clinical Impression   Pt admitted with the above diagnoses and presents with below problem list. Pt will benefit from continued acute OT to address the below listed deficits and maximize independence with basic ADLs prior to d/c to venue below. At baseline, pt utilizes walker for mobility, son assists with bathing/dressing. Pt currently needs mod A +2 for bed mobility, mod A +2 for sit<>stand transfers (placement of bed pan, pericare), +3 assist utilized for pericare in standing. Cues for TDWB status and hand placement with rw throughout session. Able to tolerate standing position for a few minutes with +2 assist needed to maintain static standing and TDWB status, incorporated breathing exercises.        Recommendations for follow up therapy are one component of a multi-disciplinary discharge planning process, led by the attending physician.  Recommendations may be updated based on patient status, additional functional criteria and insurance authorization.   Follow Up Recommendations  Skilled nursing-short term rehab (<3 hours/day)    Assistance Recommended at Discharge Frequent or constant Supervision/Assistance  Patient can return home with the following      Functional Status Assessment  Patient has had a recent decline in their functional status and demonstrates the ability to make significant improvements in function in a reasonable and predictable amount of time.  Equipment Recommendations  Other (comment) (defer to next venue)    Recommendations for Other Services       Precautions / Restrictions Precautions Precautions: Fall Restrictions Weight Bearing Restrictions:  Yes RLE Weight Bearing: Touchdown weight bearing      Mobility Bed Mobility Overal bed mobility: Needs Assistance Bed Mobility: Supine to Sit, Sit to Supine     Supine to sit: Mod assist, HOB elevated Sit to supine: Mod assist   General bed mobility comments: assist to advance RLE off EOB (unweighting assist) and for trunk powerup. Assist to advance BLE onto bed to return to supine.    Transfers Overall transfer level: Needs assistance Equipment used: Rolling walker (2 wheels) Transfers: Sit to/from Stand Sit to Stand: Mod assist, +2 physical assistance           General transfer comment: mod +2 A to stand from EOB. cues for TDWB status throughout. cues for technique with hand placement      Balance Overall balance assessment: Needs assistance Sitting-balance support: No upper extremity supported, Feet supported Sitting balance-Leahy Scale: Fair     Standing balance support: Bilateral upper extremity supported Standing balance-Leahy Scale: Poor Standing balance comment: Reliant on BUE and external support                           ADL either performed or assessed with clinical judgement   ADL Overall ADL's : Needs assistance/impaired Eating/Feeding: Set up;Sitting   Grooming: Minimal assistance;Sitting;Set up   Upper Body Bathing: Moderate assistance;Sitting   Lower Body Bathing: Maximal assistance;+2 for physical assistance;Sit to/from stand Lower Body Bathing Details (indicate cue type and reason): +3 utilized for pericare in standing Upper Body Dressing : Moderate assistance;Sitting   Lower Body Dressing: Maximal assistance;+2 for physical assistance;Sit to/from stand       Toileting- [employer] fast streamer  Manipulation and Hygiene: Maximal assistance;+2 for physical assistance;Sit to/from stand Toileting - Clothing Manipulation Details (indicate cue type and reason): +3 utilized for pericare in standing       General ADL Comments: Pt stood from EOB 3x  including placement of bed pan, pericare, and breathing exercises. +2 max A to stand, +3 for addition of LB ADL task in standing. Pt tolerated sitting EOB well for a few minutes.     Vision Baseline Vision/History: 1 Wears glasses       Perception     Praxis      Pertinent Vitals/Pain Pain Assessment Pain Assessment: Faces Faces Pain Scale: Hurts little more Pain Location: R hip Pain Descriptors / Indicators: Guarding Pain Intervention(s): Limited activity within patient's tolerance, Monitored during session, Repositioned     Hand Dominance Right   Extremity/Trunk Assessment Upper Extremity Assessment Upper Extremity Assessment: Generalized weakness;LUE deficits/detail LUE Deficits / Details: edema and erythema throughout. tender to the touch.   Lower Extremity Assessment Lower Extremity Assessment: Defer to PT evaluation   Cervical / Trunk Assessment Cervical / Trunk Assessment: Kyphotic;Other exceptions Cervical / Trunk Exceptions: Bruising on L side of pt's chest   Communication Communication Communication: HOH   Cognition Arousal/Alertness: Awake/alert Behavior During Therapy: Impulsive Overall Cognitive Status: No family/caregiver present to determine baseline cognitive functioning                                 General Comments: Pt slightly impulsive with decreased safety awareness. Required safety cues throughout. Able to answer basic questions and follow commands appropriately.     General Comments       Exercises     Shoulder Instructions      Home Living Family/patient expects to be discharged to:: Skilled nursing facility                                        Prior Functioning/Environment Prior Level of Function : Needs assist             Mobility Comments: Uses RW normally for ambulation ADLs Comments: Son assists with bathing/dressing        OT Problem List: Decreased strength;Decreased activity  tolerance;Impaired balance (sitting and/or standing);Decreased knowledge of use of DME or AE;Decreased knowledge of precautions;Pain      OT Treatment/Interventions: Self-care/ADL training;Energy conservation;DME and/or AE instruction;Balance training;Patient/family education;Therapeutic activities    OT Goals(Current goals can be found in the care plan section) Acute Rehab OT Goals Patient Stated Goal: ST SNF for continued rehab at d/c then back home with spouse OT Goal Formulation: With patient Time For Goal Achievement: 05/18/22 Potential to Achieve Goals: Good ADL Goals Pt Will Perform Lower Body Bathing: with mod assist;sit to/from stand Pt Will Perform Lower Body Dressing: with mod assist;sit to/from stand Pt Will Transfer to Toilet: with mod assist;squat pivot transfer;bedside commode Pt Will Perform Toileting - Clothing Manipulation and hygiene: sit to/from stand;with max assist  OT Frequency: Min 2X/week    Co-evaluation PT/OT/SLP Co-Evaluation/Treatment: Yes Reason for Co-Treatment: For patient/therapist safety   OT goals addressed during session: ADL's and self-care      AM-PAC OT "6 Clicks" Daily Activity     Outcome Measure Help from another person eating meals?: None Help from another person taking care of personal grooming?: A Little Help from another person toileting, which includes  using toliet, bedpan, or urinal?: Total Help from another person bathing (including washing, rinsing, drying)?: Total Help from another person to put on and taking off regular upper body clothing?: A Lot Help from another person to put on and taking off regular lower body clothing?: Total 6 Click Score: 12   End of Session Equipment Utilized During Treatment: Rolling walker (2 wheels);Gait belt Nurse Communication: Mobility status;Other (comment) (nurse present throughout session)  Activity Tolerance: Patient tolerated treatment well Patient left: in bed;with call bell/phone within  reach;with bed alarm set  OT Visit Diagnosis: Unsteadiness on feet (R26.81);History of falling (Z91.81);Muscle weakness (generalized) (M62.81);Pain Pain - Right/Left: Right Pain - part of body: Hip;Leg                Time: 1305-1330 OT Time Calculation (min): 25 min Charges:  OT General Charges $OT Visit: 1 Visit OT Evaluation $OT Eval Moderate Complexity: 1 Mod  Raynald Kemp, OT Acute Rehabilitation Services Office: [phone]   Pilar Grammes 05/04/2022, 1:52 PM

## 2022-05-05 DIAGNOSIS — I129 Hypertensive chronic kidney disease with stage 1 through stage 4 chronic kidney disease, or unspecified chronic kidney disease: Secondary | ICD-10-CM | POA: Diagnosis not present

## 2022-05-05 DIAGNOSIS — R2689 Other abnormalities of gait and mobility: Secondary | ICD-10-CM | POA: Diagnosis not present

## 2022-05-05 DIAGNOSIS — E43 Unspecified severe protein-calorie malnutrition: Secondary | ICD-10-CM | POA: Diagnosis not present

## 2022-05-05 DIAGNOSIS — K219 Gastro-esophageal reflux disease without esophagitis: Secondary | ICD-10-CM | POA: Diagnosis not present

## 2022-05-05 DIAGNOSIS — M6281 Muscle weakness (generalized): Secondary | ICD-10-CM | POA: Diagnosis not present

## 2022-05-05 DIAGNOSIS — Z7401 Bed confinement status: Secondary | ICD-10-CM | POA: Diagnosis not present

## 2022-05-05 DIAGNOSIS — R41841 Cognitive communication deficit: Secondary | ICD-10-CM | POA: Diagnosis not present

## 2022-05-05 DIAGNOSIS — D631 Anemia in chronic kidney disease: Secondary | ICD-10-CM | POA: Diagnosis not present

## 2022-05-05 DIAGNOSIS — R531 Weakness: Secondary | ICD-10-CM | POA: Diagnosis not present

## 2022-05-05 DIAGNOSIS — R262 Difficulty in walking, not elsewhere classified: Secondary | ICD-10-CM | POA: Diagnosis not present

## 2022-05-05 DIAGNOSIS — S32491D Other specified fracture of right acetabulum, subsequent encounter for fracture with routine healing: Secondary | ICD-10-CM | POA: Diagnosis not present

## 2022-05-05 DIAGNOSIS — S32401A Unspecified fracture of right acetabulum, initial encounter for closed fracture: Secondary | ICD-10-CM | POA: Diagnosis not present

## 2022-05-05 DIAGNOSIS — I130 Hypertensive heart and chronic kidney disease with heart failure and stage 1 through stage 4 chronic kidney disease, or unspecified chronic kidney disease: Secondary | ICD-10-CM | POA: Diagnosis not present

## 2022-05-05 DIAGNOSIS — I482 Chronic atrial fibrillation, unspecified: Secondary | ICD-10-CM | POA: Diagnosis not present

## 2022-05-05 DIAGNOSIS — N1831 Chronic kidney disease, stage 3a: Secondary | ICD-10-CM | POA: Diagnosis not present

## 2022-05-05 DIAGNOSIS — S32401K Unspecified fracture of right acetabulum, subsequent encounter for fracture with nonunion: Secondary | ICD-10-CM | POA: Diagnosis not present

## 2022-05-05 DIAGNOSIS — I4821 Permanent atrial fibrillation: Secondary | ICD-10-CM | POA: Diagnosis not present

## 2022-05-05 DIAGNOSIS — E871 Hypo-osmolality and hyponatremia: Secondary | ICD-10-CM | POA: Diagnosis not present

## 2022-05-05 DIAGNOSIS — S32414A Nondisplaced fracture of anterior wall of right acetabulum, initial encounter for closed fracture: Secondary | ICD-10-CM | POA: Diagnosis not present

## 2022-05-05 DIAGNOSIS — R1312 Dysphagia, oropharyngeal phase: Secondary | ICD-10-CM | POA: Diagnosis not present

## 2022-05-05 DIAGNOSIS — R791 Abnormal coagulation profile: Secondary | ICD-10-CM | POA: Diagnosis not present

## 2022-05-05 DIAGNOSIS — N179 Acute kidney failure, unspecified: Secondary | ICD-10-CM | POA: Diagnosis not present

## 2022-05-05 DIAGNOSIS — Z9181 History of falling: Secondary | ICD-10-CM | POA: Diagnosis not present

## 2022-05-05 LAB — CBC
HCT: 25.8 % — ABNORMAL LOW (ref 39.0–52.0)
Hemoglobin: 9 g/dL — ABNORMAL LOW (ref 13.0–17.0)
MCH: 33.2 pg (ref 26.0–34.0)
MCHC: 34.9 g/dL (ref 30.0–36.0)
MCV: 95.2 fL (ref 80.0–100.0)
Platelets: 219 10*3/uL (ref 150–400)
RBC: 2.71 MIL/uL — ABNORMAL LOW (ref 4.22–5.81)
RDW: 16.5 % — ABNORMAL HIGH (ref 11.5–15.5)
WBC: 7.8 10*3/uL (ref 4.0–10.5)
nRBC: 0 % (ref 0.0–0.2)

## 2022-05-05 LAB — BASIC METABOLIC PANEL
Anion gap: 9 (ref 5–15)
BUN: 35 mg/dL — ABNORMAL HIGH (ref 8–23)
CO2: 21 mmol/L — ABNORMAL LOW (ref 22–32)
Calcium: 7.8 mg/dL — ABNORMAL LOW (ref 8.9–10.3)
Chloride: 104 mmol/L (ref 98–111)
Creatinine, Ser: 1.23 mg/dL (ref 0.61–1.24)
GFR, Estimated: 53 mL/min — ABNORMAL LOW (ref >60)
Glucose, Bld: 109 mg/dL — ABNORMAL HIGH (ref 70–99)
Potassium: 5 mmol/L (ref 3.5–5.1)
Sodium: 134 mmol/L — ABNORMAL LOW (ref 135–145)

## 2022-05-05 LAB — MAGNESIUM: Magnesium: 2.3 mg/dL (ref 1.7–2.4)

## 2022-05-05 LAB — PROTIME-INR
INR: 2.3 — ABNORMAL HIGH (ref 0.8–1.2)
Prothrombin Time: 25 seconds — ABNORMAL HIGH (ref 11.4–15.2)

## 2022-05-05 MED ORDER — PRESERVISION AREDS 2 PO CAPS: 1.0000 | ORAL_CAPSULE | Freq: Two times a day (BID) | ORAL | Status: AC

## 2022-05-05 MED ORDER — ACETAMINOPHEN 325 MG PO TABS: 650.0000 mg | ORAL_TABLET | Freq: Four times a day (QID) | ORAL | Status: AC | PRN

## 2022-05-05 MED ORDER — WARFARIN SODIUM 1 MG PO TABS
1.0000 mg | ORAL_TABLET | Freq: Once | ORAL | Status: AC
Start: 2022-05-05 — End: 2022-05-05
  Administered 2022-05-05: 1 mg via ORAL
  Filled 2022-05-05: qty 1

## 2022-05-05 MED ORDER — POLYETHYLENE GLYCOL 3350 17 G PO PACK: 17.0000 g | PACK | Freq: Every day | ORAL | 0 refills | Status: AC | PRN

## 2022-05-05 NOTE — Discharge Summary (Signed)
Physician Discharge Summary  [name] QMV:[id number] DOB: [birth year] DOA: 05/01/2022  PCP: [name], MD  Admit date: 05/01/2022 Discharge date: 05/05/2022  Time spent: 55 minutes  Recommendations for Outpatient Follow-up:  Patient was discharged to skilled nursing facility.  Follow-up with MD at SNF.  Patient will need a PT/INR checked on 05/08/2022.  Patient also need a basic metabolic profile done in 1 week to follow-up on electrolytes and renal function.  Patient also need a CBC done in 1 week. Follow-up with Dr. Charlann Boxer, orthopedics in 3 weeks.   Discharge Diagnoses:  Principal Problem:   Acetabular fracture (HCC) Active Problems:   Atrial fibrillation, chronic (HCC)   Supratherapeutic INR   Stage 3a chronic kidney disease (HCC)   Discharge Condition: Stable and improved  Diet recommendation: Soft diet  There were no vitals filed for this visit.  History of present illness:  HPI per Dr [name] is a 88 y.o. male with medical history significant for permanent A-fib on Coumadin, rheumatoid arthritis, GERD, hypertension, who initially presented to [facility] ED with complaints of inability to walk x3 days due to severe right hip pain.  His right hip pain has become severe.  At baseline uses a walker to ambulate.  He denies any falls.  He bumped the left side of his chest on the side of the bathtub, ecchymosis are present.  The patient was transferred to [facility] for MRI lumbar spine and right hip.  At [facility] the patient was found to have an acetabular fracture.  EDP discussed the case with orthopedic surgery Dr. Rennis Chris who recommended non operative management.  Non weightbearing and progress to walker.  They will see the patient in consultation.  EDP requested admission.  Admitted by the Hospitalist service, TR[facility].   ED Course: Tmax 98.3.  BP 142/83, pulse 97, respiratory 16, saturation 97% on room air.  Lab studies remarkable for BUN 27,  creatinine 1.23.  GFR 49.  Hemoglobin 9.2 from 11.4 about a month ago.  Hospital Course:  1 acetabular fracture -Patient presented with acetabular fracture. -Orthopedic consultation obtained who recommended nonoperative management at this time with nonweightbearing. -PT /OT recommended SNF placement. -Pain management, supportive care. -Outpatient follow-up with orthopedics, Dr. Charlann Boxer in 3 weeks. -Patient was discharged to skilled nursing facility  2.  Permanent A-fib, rate control/supratherapeutic INR. -Patient was maintained on home regimen diltiazem and Toprol-XL for rate control.   -INR was supratherapeutic on presentation and as such Coumadin was held.   -INR trended down and was 2.3 by day of discharge.   -Coumadin was resumed and patient will be continued on home regimen of Coumadin.   -We will need INR check on 05/08/2022. -Outpatient follow-up.  3.  Anemia of chronic disease/macrocytic anemia -Patient presented anemia with a decrease in hemoglobin likely partial dilutional secondary to hematoma noted on upper chest wall. -Patient denied any overt bleeding and per nursing student patient with brown loose stools. -Anemia panel consistent with anemia of chronic disease. -Hemoglobin at stabilized at 9.0 on day of dischargefrom 6.9 after transfusion of 2 units packed red blood cells.    4. supratherapeutic INR -INR noted to be 4.1 on admission. -INR trending down to 2.3 by day of discharge.  -Coumadin to be resumed on 05/04/2022 per pharmacy. -Patient be discharged to skilled nursing facility back on home regimen of Coumadin and will need INR checked on Tuesday, 05/08/2022.  5.  AKI on CKD stage IIIA -Baseline creatinine approximately 1.1. -Patient  presented with a creatinine 1.33. -Avoided nephrotoxic agents, hypotension, dehydration. -Urine output not properly recorded. -Creatinine slowly trended up to a peak of 1.55 from 1.48 from 1.18 from 1.33 on admission. -Status post  transfusion 2 units packed red blood cells. -Patient hydrated gently with IV fluids with improvement with renal function such that by day of discharge creatinine was down to 1.23. -Outpatient follow-up.      Procedures: Transfusion 2 units packed red blood cells 05/03/2022 CT right hip 05/01/2022 CT L-spine 05/01/2022 Plain films of the right hip and pelvis 05/01/2022 MRI right hip 05/02/2022 MRI L-spine 05/02/2022  Consultations: Orthopedics: Earney Hamburg, PA 05/02/2022  Discharge Exam: Vitals:   05/05/22 0444 05/05/22 0805  BP: 111/73 135/80  Pulse: 95 94  Resp: 16 18  Temp: 98.2 F (36.8 C) 97.7 F (36.5 C)  SpO2: 94% 97%    General: NAD Cardiovascular: Regular rate rhythm no murmurs rubs or gallops.  No JVD.  No lower extremity edema. Respiratory: Clear to auscultation anterior lung fields.  Discharge Instructions   Discharge Instructions     Diet general   Complete by: As directed    Soft diet   Increase activity slowly   Complete by: As directed    TDWB RLE      Allergies as of 05/05/2022       Reactions   Shellfish Allergy Anaphylaxis   Sulfa Antibiotics Anaphylaxis   Fish Allergy         Medication List     STOP taking these medications    docusate sodium 100 MG capsule Commonly known as: COLACE       TAKE these medications    acetaminophen 325 MG tablet Commonly known as: TYLENOL Take 2 tablets (650 mg total) by mouth every 6 (six) hours as needed for fever, headache or mild pain.   diltiazem 360 MG 24 hr capsule Commonly known as: TIAZAC Take 360 mg by mouth daily.   ferrous sulfate 325 (65 FE) MG tablet Take 1 tablet (325 mg total) 3 (three) times daily after meals by mouth.   Fish Oil 1000 MG Caps Take 1 capsule (1,000 mg total) by mouth 2 (two) times daily.   metoprolol succinate 50 MG 24 hr tablet Commonly known as: TOPROL-XL Take 50 mg by mouth daily.   pantoprazole 40 MG tablet Commonly known as: PROTONIX Take 1  tablet (40 mg total) by mouth 2 (two) times daily.   polyethylene glycol 17 g packet Commonly known as: MIRALAX / GLYCOLAX Take 17 g by mouth daily as needed for mild constipation.   PreserVision AREDS 2 Caps Take 1 capsule by mouth 2 (two) times daily.   Rocklatan 0.02-0.005 % Soln Generic drug: Netarsudil-Latanoprost SMARTSIG:1 Drop(s) Right Eye Every Evening   timolol 0.5 % ophthalmic solution Commonly known as: TIMOPTIC Place 1 drop into the right eye every morning.   warfarin 2 MG tablet Commonly known as: COUMADIN TAKE 1 TABLET BY MOUTH EVERY DAY EXCEPT ON WEDNESDAY TAKE 2 TABS       Allergies  Allergen Reactions   Shellfish Allergy Anaphylaxis   Sulfa Antibiotics Anaphylaxis   Fish Allergy     Follow-up Information     MD AT SNF Follow up.          Durene Romans, MD. Schedule an appointment as soon as possible for a visit in 3 week(s).   Specialty: Orthopedic Surgery Contact information: [address] [place] [address] [phone]  The results of significant diagnostics from this hospitalization (including imaging, microbiology, ancillary and laboratory) are listed below for reference.    Significant Diagnostic Studies: MR HIP RIGHT WO CONTRAST  Result Date: 05/02/2022 CLINICAL DATA:  Indeterminate CT for hip fracture. MRI was performed for further characterization of persistent pain. EXAM: MR OF THE RIGHT HIP WITHOUT CONTRAST TECHNIQUE: Multiplanar, multisequence MR imaging was performed. No intravenous contrast was administered. COMPARISON:  CT right hip 05/01/2022 FINDINGS: Bones: Left hip arthroplasty with susceptibility artifact partially obscuring the adjacent soft tissue and osseous structures. No periarticular fluid collection or osteolysis. No proximal right femoral fracture. Bone marrow edema in the right superior acetabulum with a subtle linear component consistent with a nondisplaced insufficiency  fracture. No periosteal reaction or bone destruction. No aggressive osseous lesion. Mild osteoarthritis of bilateral SI joints. Degenerative disease with severe disc height loss at L3-4, L4-5 and L5-S1. Articular cartilage and labrum Articular cartilage: Partial-thickness cartilage loss of the right femoral head and acetabulum. Labrum: Right labral degeneration with a degenerative tear of the superior labrum. Joint or bursal effusion Joint effusion:  No hip joint effusion.  No SI joint effusion. Bursae: Small amount of fluid in the right greater trochanteric bursa. Muscles and tendons Flexors: Small amount of perifascial fluid deep to the iliacus muscle bilaterally. Extensors: Normal. Abductors: Normal. Adductors: Normal. Gluteals: Partial-thickness tear of the right gluteus minimus tendon insertion. Mild muscle edema in the right gluteus minimus muscle consistent with muscle strain. Hamstrings: Partial-thickness tear of the right hamstring origin. Other findings No pelvic free fluid. No fluid collection or hematoma. No inguinal lymphadenopathy. No inguinal hernia. Severely distended bladder. IMPRESSION: 1. Nondisplaced right superior acetabular insufficiency fracture with surrounding marrow edema. 2. Partial-thickness tear of the right gluteus minimus tendon insertion. Mild muscle edema in the right gluteus minimus muscle consistent with muscle strain. Electronically Signed   By: Elige Ko M.D.   On: 05/02/2022 06:45   MR LUMBAR SPINE WO CONTRAST  Result Date: 05/02/2022 CLINICAL DATA:  Low back pain, cauda equina syndrome suspected EXAM: MRI LUMBAR SPINE WITHOUT CONTRAST TECHNIQUE: Multiplanar, multisequence MR imaging of the lumbar spine was performed. No intravenous contrast was administered. COMPARISON:  No prior MRI of the lumbar spine, correlation is made with CT lumbar spine 05/01/2022 FINDINGS: Segmentation:  Standard. Alignment: Scoliosis, with dextrocurvature of the lumbar spine. Mild  retrolisthesis of L3 on L4. Vertebrae:  No acute fracture or suspicious osseous lesion. Conus medullaris and cauda equina: Conus extends to the L1-L2 level. Conus and cauda equina appear normal. Paraspinal and other soft tissues: Large left renal cyst. Disc levels: T12-L1: Severe disc height loss with minimal disc bulge. Mild facet arthropathy. No spinal canal stenosis or neural foraminal narrowing. L1-L2: Severe disc height loss with small disc osteophyte complex. Mild facet arthropathy. Narrowing of the left lateral recess. No spinal canal stenosis or neural foraminal narrowing. L2-L3: Disc height loss with left eccentric disc osteophyte complex and right eccentric disc bulge. Mild facet arthropathy. Narrowing of the lateral recesses. Mild spinal canal stenosis. Mild left neural foraminal narrowing. L3-L4: Disc height loss with disc osteophyte complex moderate disc bulge, and mild retrolisthesis. Moderate facet arthropathy. Moderate spinal canal stenosis. Narrowing of the lateral recesses. Mild-to-moderate left and mild right neural foraminal narrowing. L4-L5: Disc height loss with disc osteophyte complex and minimal disc bulge. Severe left and moderate right facet arthropathy. Narrowing of the lateral recesses. No spinal canal stenosis. Mild bilateral neural foraminal narrowing. L5-S1: Disc height loss with disc osteophyte complex. Moderate  facet arthropathy. No spinal canal stenosis. Moderate right neural foraminal narrowing. IMPRESSION: 1. L3-L4 moderate spinal canal stenosis, with mild-to-moderate left and mild right neural foraminal narrowing. 2. L2-L3 mild spinal canal stenosis and mild left neural foraminal narrowing. 3. L4-L5 mild bilateral neural foraminal narrowing. 4. L5-S1 moderate right neural foraminal narrowing. 5. Narrowing of the lateral recesses at L1-L2, L2-L3, L3-L4, and L4-L5, which could affect the descending L2, L3, L4, and L5 nerves, respectively. Electronically Signed   By: Wiliam Ke  M.D.   On: 05/02/2022 02:45   CT Lumbar Spine Wo Contrast  Result Date: 05/01/2022 CLINICAL DATA:  Lower back pain. Cauda equina syndrome suspected. Right hip/lower back pain with right leg weakness. Unable to walk with a walker today. EXAM: CT LUMBAR SPINE WITHOUT CONTRAST TECHNIQUE: Multidetector CT imaging of the lumbar spine was performed without intravenous contrast administration. Multiplanar CT image reconstructions were also generated. RADIATION DOSE REDUCTION: This exam was performed according to the departmental dose-optimization program which includes automated exposure control, adjustment of the mA and/or kV according to patient size and/or use of iterative reconstruction technique. COMPARISON:  Pelvis and right hip radiographs 05/01/2022 FINDINGS: Segmentation: Standard. Alignment: There is moderate dextrocurvature centered at L2. Mild 2 mm retrolisthesis of L4 on L5. Vertebrae: Vertebral body heights are maintained. Severe multilevel disc space narrowing, greatest within the anterior right T12-L1 left L1-2, mid to left L2-3, right L3-4, diffuse L4-5, and right L5-S1 levels. Degenerative vacuum phenomenon at the T10-11 and L3-4 through L5-S1 levels. Paraspinal and other soft tissues: The lung bases are minimally partially visualized. There appear to be right-greater-than-left pleural effusions. Moderate to high-grade atherosclerotic calcifications within the partially visualized aorta and iliac arteries. Disc levels: T11-12: Moderate right-greater-than-left facet joint hypertrophy. Scoliotic curvature. Moderate left osseous neuroforaminal stenosis. Moderate narrowing of the lateral recesses and borderline mild central canal stenosis. T12-L1: Moderate bilateral facet joint hypertrophy. Mild left osseous neuroforaminal stenosis. L1-2: Moderate bilateral facet joint hypertrophy. Mild posterior endplate ridging. Mild left neuroforaminal stenosis. Borderline mild central canal stenosis. L2-3: Moderate  bilateral facet joint hypertrophy. Left-greater-than-right posterior disc osteophyte complex. Moderate left neuroforaminal stenosis. Moderate narrowing of the lateral recesses and mild-to-moderate central canal stenosis. L3-4: Moderate to severe bilateral facet joint hypertrophy. Moderate broad-based posterior disc osteophyte complex with bilateral intraforaminal extension. Moderate left and mild-to-moderate right neuroforaminal stenosis. Severe right lateral recess stenosis. Likely moderate central canal stenosis. L4-5: Moderate bilateral facet joint hypertrophy. Mild-to-moderate broad-based posterior disc osteophyte complex. Mild right-greater-than-left neuroforaminal stenosis. Moderate narrowing of the lateral recesses and moderate central canal stenosis. L5-S1: Moderate right and mild left facet joint hypertrophy. Mild-to-moderate broad-based posterior disc osteophyte complex with right intraforaminal extension. Mild-to-moderate right neuroforaminal stenosis. Mild narrowing of lateral recesses. No significant central canal stenosis. IMPRESSION: 1. Moderate to severe multilevel degenerative disc and joint changes as above. 2. Multilevel neuroforaminal stenosis including moderate left L2-3, moderate left and mild-to-moderate right L3-4, mild right-greater-than-left L4-5, and mild-to-moderate right L5-S1 osseous neuroforaminal stenosis. 3. Multilevel central canal stenosis as above mild-to-moderate at L2-3 and moderate L3-4 and L4-5. Electronically Signed   By: Neita Garnet M.D.   On: 05/01/2022 21:11   CT Hip Right Wo Contrast  Result Date: 05/01/2022 CLINICAL DATA:  Irregularity of right superior pubic ramus on recent plain film EXAM: CT OF THE RIGHT HIP WITHOUT CONTRAST TECHNIQUE: Multidetector CT imaging of the right hip was performed according to the standard protocol. Multiplanar CT image reconstructions were also generated. RADIATION DOSE REDUCTION: This exam was performed according to the departmental  dose-optimization program which includes automated exposure control, adjustment of the mA and/or kV according to patient size and/or use of iterative reconstruction technique. COMPARISON:  Plain film from earlier in the same day. FINDINGS: Bones/Joint/Cartilage Diffuse osteopenia is noted. No acute fracture is identified. No dislocation is seen. The superior pubic ramus on the right appears intact. The area of abnormality seen on recent plain film is not borne out this exam. Ligaments Suboptimally assessed by CT. Muscles and Tendons Surrounding musculature appears within normal limits with the exception of some fatty replacement of the right gluteus minimus. Soft tissues No evidence of joint effusion is seen. No soft tissue abnormality is noted. Diffuse vascular calcifications are seen. IMPRESSION: No acute fracture is identified. The area of abnormality in the superior pubic ramus on the right is not borne out on this exam. Mild muscular atrophy with fatty replacement involving the gluteus minimus on the right. Electronically Signed   By: Alcide Clever M.D.   On: 05/01/2022 19:55   DG Hip Unilat W or Wo Pelvis 2-3 Views Right  Result Date: 05/01/2022 CLINICAL DATA:  A 88 year old male presents with concern for hip fracture. EXAM: DG HIP (WITH OR WITHOUT PELVIS) 2-3V RIGHT COMPARISON:  March 11, 2015 and November of 2018. FINDINGS: Osteopenia. RIGHT hip is located.  No signs of RIGHT hip fracture. Mild irregularity of the superior pubic ramus on the RIGHT. No definite irregularity of the inferior pubic ramus. Signs of LEFT total hip arthroplasty. Stool and gas overlies the iliac crest bilaterally. Slightly nonstandard positioning with rotation of the pelvis does limit assessment of the RIGHT iliac. IMPRESSION: 1. Mild irregularity of the superior pubic ramus on the RIGHT. No definite irregularity of the inferior pubic ramus. Difficult to exclude a subtle nondisplaced fracture or developing insufficiency changes.  2. Osteopenia. 3. Signs of LEFT total hip arthroplasty. Electronically Signed   By: Donzetta Kohut M.D.   On: 05/01/2022 14:58    Microbiology: No results found for this or any previous visit (from the past 240 hour(s)).   Labs: Basic Metabolic Panel: Recent Labs  Lab 05/01/22 1450 05/02/22 0725 05/03/22 0926 05/04/22 0029 05/05/22 0051  NA 138 138 137 136 134*  K 4.7 4.4 4.5 4.0 5.0  CL 106 110 108 108 104  CO2 23 22 22  20* 21*  GLUCOSE 111* 95 153* 150* 109*  BUN 27* 23 31* 34* 35*  CREATININE 1.33* 1.18 1.48* 1.55* 1.23  CALCIUM 8.9 8.2* 8.1* 7.8* 7.8*  MG  --  2.0  --  1.8 2.3  PHOS  --  2.9  --   --   --    Liver Function Tests: Recent Labs  Lab 05/02/22 0725  AST 19  ALT 13  ALKPHOS 87  BILITOT 0.9  PROT 5.6*  ALBUMIN 2.6*   No results for input(s): "LIPASE", "AMYLASE" in the last 168 hours. No results for input(s): "AMMONIA" in the last 168 hours. CBC: Recent Labs  Lab 05/01/22 1450 05/02/22 0725 05/03/22 0926 05/04/22 0029 05/05/22 0051  WBC 8.5 6.5 7.6 7.5 7.8  NEUTROABS  --  5.0 6.2  --   --   HGB 9.2* 8.3* 6.9* 8.5* 9.0*  HCT 28.5* 24.9* 21.0* 25.0* 25.8*  MCV 99.3 101.2* 101.9* 95.8 95.2  PLT 211 191 205 163 219   Cardiac Enzymes: No results for input(s): "CKTOTAL", "CKMB", "CKMBINDEX", "TROPONINI" in the last 168 hours. BNP: BNP (last 3 results) No results for input(s): "BNP" in the last 8760 hours.  ProBNP (  last 3 results) No results for input(s): "PROBNP" in the last 8760 hours.  CBG: Recent Labs  Lab 05/01/22 1430  GLUCAP 110*       Signed:  Ramiro Harvest MD.  [facility] 05/05/2022, 10:50 AM

## 2022-05-05 NOTE — Plan of Care (Signed)
Plan for SNF transfer.  Problem: Education: Goal: Knowledge of General Education information will improve Description: Including pain rating scale, medication(s)/side effects and non-pharmacologic comfort measures Outcome: Progressing   Problem: Health Behavior/Discharge Planning: Goal: Ability to manage health-related needs will improve Outcome: Progressing   Problem: Clinical Measurements: Goal: Ability to maintain clinical measurements within normal limits will improve Outcome: Progressing Goal: Will remain free from infection Outcome: Progressing Goal: Diagnostic test results will improve Outcome: Progressing Goal: Respiratory complications will improve Outcome: Progressing Goal: Cardiovascular complication will be avoided Outcome: Progressing   Problem: Activity: Goal: Risk for activity intolerance will decrease Outcome: Progressing   Problem: Nutrition: Goal: Adequate nutrition will be maintained Outcome: Progressing   Problem: Coping: Goal: Level of anxiety will decrease Outcome: Progressing   Problem: Elimination: Goal: Will not experience complications related to bowel motility Outcome: Progressing Goal: Will not experience complications related to urinary retention Outcome: Progressing   Problem: Pain Managment: Goal: General experience of comfort will improve Outcome: Progressing   Problem: Safety: Goal: Ability to remain free from injury will improve Outcome: Progressing   Problem: Skin Integrity: Goal: Risk for impaired skin integrity will decrease Outcome: Progressing

## 2022-05-07 DIAGNOSIS — I4821 Permanent atrial fibrillation: Secondary | ICD-10-CM | POA: Diagnosis not present

## 2022-05-07 DIAGNOSIS — I129 Hypertensive chronic kidney disease with stage 1 through stage 4 chronic kidney disease, or unspecified chronic kidney disease: Secondary | ICD-10-CM | POA: Diagnosis not present

## 2022-05-07 DIAGNOSIS — M6281 Muscle weakness (generalized): Secondary | ICD-10-CM | POA: Diagnosis not present

## 2022-05-07 DIAGNOSIS — N1831 Chronic kidney disease, stage 3a: Secondary | ICD-10-CM | POA: Diagnosis not present

## 2022-05-07 DIAGNOSIS — I482 Chronic atrial fibrillation, unspecified: Secondary | ICD-10-CM | POA: Diagnosis not present

## 2022-05-07 DIAGNOSIS — D631 Anemia in chronic kidney disease: Secondary | ICD-10-CM | POA: Diagnosis not present

## 2022-05-07 DIAGNOSIS — S32491D Other specified fracture of right acetabulum, subsequent encounter for fracture with routine healing: Secondary | ICD-10-CM | POA: Diagnosis not present

## 2022-05-07 DIAGNOSIS — R262 Difficulty in walking, not elsewhere classified: Secondary | ICD-10-CM | POA: Diagnosis not present

## 2022-05-10 DIAGNOSIS — I129 Hypertensive chronic kidney disease with stage 1 through stage 4 chronic kidney disease, or unspecified chronic kidney disease: Secondary | ICD-10-CM | POA: Diagnosis not present

## 2022-05-10 DIAGNOSIS — I130 Hypertensive heart and chronic kidney disease with heart failure and stage 1 through stage 4 chronic kidney disease, or unspecified chronic kidney disease: Secondary | ICD-10-CM | POA: Diagnosis not present

## 2022-05-10 DIAGNOSIS — M6281 Muscle weakness (generalized): Secondary | ICD-10-CM | POA: Diagnosis not present

## 2022-05-10 DIAGNOSIS — I4821 Permanent atrial fibrillation: Secondary | ICD-10-CM | POA: Diagnosis not present

## 2022-05-10 DIAGNOSIS — N1831 Chronic kidney disease, stage 3a: Secondary | ICD-10-CM | POA: Diagnosis not present

## 2022-05-10 DIAGNOSIS — D631 Anemia in chronic kidney disease: Secondary | ICD-10-CM | POA: Diagnosis not present

## 2022-05-10 DIAGNOSIS — I482 Chronic atrial fibrillation, unspecified: Secondary | ICD-10-CM | POA: Diagnosis not present

## 2022-05-10 DIAGNOSIS — S32491D Other specified fracture of right acetabulum, subsequent encounter for fracture with routine healing: Secondary | ICD-10-CM | POA: Diagnosis not present

## 2022-05-10 DIAGNOSIS — R262 Difficulty in walking, not elsewhere classified: Secondary | ICD-10-CM | POA: Diagnosis not present

## 2022-05-14 DIAGNOSIS — I4821 Permanent atrial fibrillation: Secondary | ICD-10-CM | POA: Diagnosis not present

## 2022-05-14 DIAGNOSIS — D631 Anemia in chronic kidney disease: Secondary | ICD-10-CM | POA: Diagnosis not present

## 2022-05-14 DIAGNOSIS — I129 Hypertensive chronic kidney disease with stage 1 through stage 4 chronic kidney disease, or unspecified chronic kidney disease: Secondary | ICD-10-CM | POA: Diagnosis not present

## 2022-05-14 DIAGNOSIS — R262 Difficulty in walking, not elsewhere classified: Secondary | ICD-10-CM | POA: Diagnosis not present

## 2022-05-14 DIAGNOSIS — I482 Chronic atrial fibrillation, unspecified: Secondary | ICD-10-CM | POA: Diagnosis not present

## 2022-05-14 DIAGNOSIS — S32491D Other specified fracture of right acetabulum, subsequent encounter for fracture with routine healing: Secondary | ICD-10-CM | POA: Diagnosis not present

## 2022-05-14 DIAGNOSIS — M6281 Muscle weakness (generalized): Secondary | ICD-10-CM | POA: Diagnosis not present

## 2022-05-14 DIAGNOSIS — N1831 Chronic kidney disease, stage 3a: Secondary | ICD-10-CM | POA: Diagnosis not present

## 2022-05-17 DIAGNOSIS — M6281 Muscle weakness (generalized): Secondary | ICD-10-CM | POA: Diagnosis not present

## 2022-05-17 DIAGNOSIS — I4821 Permanent atrial fibrillation: Secondary | ICD-10-CM | POA: Diagnosis not present

## 2022-05-17 DIAGNOSIS — D631 Anemia in chronic kidney disease: Secondary | ICD-10-CM | POA: Diagnosis not present

## 2022-05-17 DIAGNOSIS — I129 Hypertensive chronic kidney disease with stage 1 through stage 4 chronic kidney disease, or unspecified chronic kidney disease: Secondary | ICD-10-CM | POA: Diagnosis not present

## 2022-05-17 DIAGNOSIS — N1831 Chronic kidney disease, stage 3a: Secondary | ICD-10-CM | POA: Diagnosis not present

## 2022-05-17 DIAGNOSIS — S32491D Other specified fracture of right acetabulum, subsequent encounter for fracture with routine healing: Secondary | ICD-10-CM | POA: Diagnosis not present

## 2022-05-17 DIAGNOSIS — R262 Difficulty in walking, not elsewhere classified: Secondary | ICD-10-CM | POA: Diagnosis not present

## 2022-05-21 DIAGNOSIS — M6281 Muscle weakness (generalized): Secondary | ICD-10-CM | POA: Diagnosis not present

## 2022-05-21 DIAGNOSIS — D631 Anemia in chronic kidney disease: Secondary | ICD-10-CM | POA: Diagnosis not present

## 2022-05-21 DIAGNOSIS — E43 Unspecified severe protein-calorie malnutrition: Secondary | ICD-10-CM | POA: Diagnosis not present

## 2022-05-21 DIAGNOSIS — N1831 Chronic kidney disease, stage 3a: Secondary | ICD-10-CM | POA: Diagnosis not present

## 2022-05-21 DIAGNOSIS — I4821 Permanent atrial fibrillation: Secondary | ICD-10-CM | POA: Diagnosis not present

## 2022-05-21 DIAGNOSIS — R262 Difficulty in walking, not elsewhere classified: Secondary | ICD-10-CM | POA: Diagnosis not present

## 2022-05-21 DIAGNOSIS — I129 Hypertensive chronic kidney disease with stage 1 through stage 4 chronic kidney disease, or unspecified chronic kidney disease: Secondary | ICD-10-CM | POA: Diagnosis not present

## 2022-05-21 DIAGNOSIS — S32491D Other specified fracture of right acetabulum, subsequent encounter for fracture with routine healing: Secondary | ICD-10-CM | POA: Diagnosis not present

## 2022-05-21 DIAGNOSIS — E871 Hypo-osmolality and hyponatremia: Secondary | ICD-10-CM | POA: Diagnosis not present

## 2022-05-23 ENCOUNTER — Other Ambulatory Visit: Payer: Self-pay | Admitting: *Deleted

## 2022-05-23 NOTE — Patient Outreach (Signed)
Per [place] eligible member currently resides in [facility].  Screened for potential care coordination services as a benefit for Mr. [employer] plan.   Mr. Clinch PCP at [facility] has [facility] care management services.  Mr. Merriott admitted to SNF on 05/05/22  after hospitalization.  Will send notification to [facility] if/when Mr. Iten returns home.    Marthenia Rolling, MSN, RN,BSN [place] Acute Care Coordinator [phone] [facility]) [phone]  (Toll free office)

## 2022-05-24 DIAGNOSIS — S32491D Other specified fracture of right acetabulum, subsequent encounter for fracture with routine healing: Secondary | ICD-10-CM | POA: Diagnosis not present

## 2022-05-24 DIAGNOSIS — D631 Anemia in chronic kidney disease: Secondary | ICD-10-CM | POA: Diagnosis not present

## 2022-05-24 DIAGNOSIS — N1831 Chronic kidney disease, stage 3a: Secondary | ICD-10-CM | POA: Diagnosis not present

## 2022-05-24 DIAGNOSIS — I129 Hypertensive chronic kidney disease with stage 1 through stage 4 chronic kidney disease, or unspecified chronic kidney disease: Secondary | ICD-10-CM | POA: Diagnosis not present

## 2022-05-24 DIAGNOSIS — I4821 Permanent atrial fibrillation: Secondary | ICD-10-CM | POA: Diagnosis not present

## 2022-05-24 DIAGNOSIS — R262 Difficulty in walking, not elsewhere classified: Secondary | ICD-10-CM | POA: Diagnosis not present

## 2022-05-24 DIAGNOSIS — M6281 Muscle weakness (generalized): Secondary | ICD-10-CM | POA: Diagnosis not present

## 2022-05-28 DIAGNOSIS — M6281 Muscle weakness (generalized): Secondary | ICD-10-CM | POA: Diagnosis not present

## 2022-05-28 DIAGNOSIS — S32491D Other specified fracture of right acetabulum, subsequent encounter for fracture with routine healing: Secondary | ICD-10-CM | POA: Diagnosis not present

## 2022-05-28 DIAGNOSIS — N1831 Chronic kidney disease, stage 3a: Secondary | ICD-10-CM | POA: Diagnosis not present

## 2022-05-28 DIAGNOSIS — I129 Hypertensive chronic kidney disease with stage 1 through stage 4 chronic kidney disease, or unspecified chronic kidney disease: Secondary | ICD-10-CM | POA: Diagnosis not present

## 2022-05-28 DIAGNOSIS — R262 Difficulty in walking, not elsewhere classified: Secondary | ICD-10-CM | POA: Diagnosis not present

## 2022-05-28 DIAGNOSIS — I482 Chronic atrial fibrillation, unspecified: Secondary | ICD-10-CM | POA: Diagnosis not present

## 2022-05-28 DIAGNOSIS — I4821 Permanent atrial fibrillation: Secondary | ICD-10-CM | POA: Diagnosis not present

## 2022-05-28 DIAGNOSIS — D631 Anemia in chronic kidney disease: Secondary | ICD-10-CM | POA: Diagnosis not present

## 2022-05-30 ENCOUNTER — Other Ambulatory Visit: Payer: Self-pay | Admitting: *Deleted

## 2022-05-30 NOTE — Patient Outreach (Signed)
[facility] Post- Acute Care Coordinator follow up. Per [place] eligible member currently resides in [facility].  Screening for care management/care coordination needs as a benefit of Mr. Stillinger insurance plan and PCP.  Mr. Vieth PCP at [facility] has [facility] care management services.  Facility site visit to [facility] skilled nursing facility. Met with Marita Kansas, SNF. Marita Kansas reports family meeting to be scheduled to discuss transition plan/date. Transition plan LTC vs ALF pending progress an outcome of family meeting.    Marthenia Rolling, MSN, RN,BSN [place] Acute Care Coordinator [phone] [facility]) [phone]  (Toll free office)

## 2022-05-31 DIAGNOSIS — D631 Anemia in chronic kidney disease: Secondary | ICD-10-CM | POA: Diagnosis not present

## 2022-05-31 DIAGNOSIS — R262 Difficulty in walking, not elsewhere classified: Secondary | ICD-10-CM | POA: Diagnosis not present

## 2022-05-31 DIAGNOSIS — S32491D Other specified fracture of right acetabulum, subsequent encounter for fracture with routine healing: Secondary | ICD-10-CM | POA: Diagnosis not present

## 2022-05-31 DIAGNOSIS — M6281 Muscle weakness (generalized): Secondary | ICD-10-CM | POA: Diagnosis not present

## 2022-05-31 DIAGNOSIS — I4821 Permanent atrial fibrillation: Secondary | ICD-10-CM | POA: Diagnosis not present

## 2022-05-31 DIAGNOSIS — S32414A Nondisplaced fracture of anterior wall of right acetabulum, initial encounter for closed fracture: Secondary | ICD-10-CM | POA: Diagnosis not present

## 2022-05-31 DIAGNOSIS — I129 Hypertensive chronic kidney disease with stage 1 through stage 4 chronic kidney disease, or unspecified chronic kidney disease: Secondary | ICD-10-CM | POA: Diagnosis not present

## 2022-05-31 DIAGNOSIS — N1831 Chronic kidney disease, stage 3a: Secondary | ICD-10-CM | POA: Diagnosis not present

## 2022-06-04 DIAGNOSIS — R262 Difficulty in walking, not elsewhere classified: Secondary | ICD-10-CM | POA: Diagnosis not present

## 2022-06-04 DIAGNOSIS — M6281 Muscle weakness (generalized): Secondary | ICD-10-CM | POA: Diagnosis not present

## 2022-06-04 DIAGNOSIS — I4821 Permanent atrial fibrillation: Secondary | ICD-10-CM | POA: Diagnosis not present

## 2022-06-04 DIAGNOSIS — D631 Anemia in chronic kidney disease: Secondary | ICD-10-CM | POA: Diagnosis not present

## 2022-06-04 DIAGNOSIS — S32491D Other specified fracture of right acetabulum, subsequent encounter for fracture with routine healing: Secondary | ICD-10-CM | POA: Diagnosis not present

## 2022-06-04 DIAGNOSIS — N1831 Chronic kidney disease, stage 3a: Secondary | ICD-10-CM | POA: Diagnosis not present

## 2022-06-04 DIAGNOSIS — I129 Hypertensive chronic kidney disease with stage 1 through stage 4 chronic kidney disease, or unspecified chronic kidney disease: Secondary | ICD-10-CM | POA: Diagnosis not present

## 2022-06-06 ENCOUNTER — Other Ambulatory Visit: Payer: Self-pay | Admitting: *Deleted

## 2022-06-06 NOTE — Patient Outreach (Signed)
[facility] Post- Acute Care Coordinator follow up.  Met with Marita Kansas, SNF SW at [facility]. Marita Kansas confirmed Mr. Tornow expired at [facility] on yesterday 05/28/2022.   Marthenia Rolling, MSN, RN,BSN [place] Acute Care Coordinator [phone] [facility]) [phone]  (Toll free office)

## 2022-06-12 DEATH — deceased

## 2022-07-09 ENCOUNTER — Ambulatory Visit: Payer: Medicare Other | Admitting: Podiatry

## 2022-07-23 ENCOUNTER — Encounter (INDEPENDENT_AMBULATORY_CARE_PROVIDER_SITE_OTHER): Payer: Medicare Other | Admitting: Ophthalmology
